# Patient Record
Sex: Female | Born: 1940 | Race: Black or African American | Hispanic: No | Marital: Married | Smoking: Never smoker
Health system: Southern US, Community
[De-identification: ages and names within clinical notes are randomized; demographics above are authoritative.]

## PROBLEM LIST (undated history)

## (undated) DIAGNOSIS — M4712 Other spondylosis with myelopathy, cervical region: Secondary | ICD-10-CM

## (undated) DIAGNOSIS — F32A Depression, unspecified: Secondary | ICD-10-CM

## (undated) DIAGNOSIS — R569 Unspecified convulsions: Secondary | ICD-10-CM

## (undated) DIAGNOSIS — G4730 Sleep apnea, unspecified: Secondary | ICD-10-CM

## (undated) DIAGNOSIS — G959 Disease of spinal cord, unspecified: Secondary | ICD-10-CM

## (undated) DIAGNOSIS — F329 Major depressive disorder, single episode, unspecified: Secondary | ICD-10-CM

## (undated) DIAGNOSIS — I10 Essential (primary) hypertension: Secondary | ICD-10-CM

## (undated) DIAGNOSIS — M5412 Radiculopathy, cervical region: Secondary | ICD-10-CM

## (undated) DIAGNOSIS — R55 Syncope and collapse: Secondary | ICD-10-CM

## (undated) HISTORY — DX: Radiculopathy, cervical region: G95.9

## (undated) HISTORY — DX: Depression, unspecified: F32.A

## (undated) HISTORY — PX: EYE SURGERY: SHX253

## (undated) HISTORY — PX: SPINE SURGERY: SHX786

## (undated) HISTORY — DX: Other spondylosis with myelopathy, cervical region: M47.12

## (undated) HISTORY — PX: CATARACT EXTRACTION: SUR2

## (undated) HISTORY — DX: Major depressive disorder, single episode, unspecified: F32.9

## (undated) HISTORY — PX: VAGINAL HYSTERECTOMY: SUR661

## (undated) HISTORY — DX: Sleep apnea, unspecified: G47.30

## (undated) HISTORY — DX: Disease of spinal cord, unspecified: M54.12

## (undated) HISTORY — PX: FOOT SURGERY: SHX648

## (undated) HISTORY — PX: NECK SURGERY: SHX720

## (undated) HISTORY — PX: COLONOSCOPY: SHX5424

---

## 2005-09-12 ENCOUNTER — Ambulatory Visit: Payer: Self-pay

## 2005-10-01 ENCOUNTER — Ambulatory Visit: Payer: Self-pay | Admitting: Family

## 2005-11-08 ENCOUNTER — Ambulatory Visit: Payer: Self-pay | Admitting: Oncology

## 2005-12-11 LAB — CBC WITH DIFFERENTIAL (CANCER CENTER ONLY)
BASO#: 0.1 10*3/uL (ref 0.0–0.2)
BASO%: 0.8 % (ref 0.0–2.0)
EOS%: 3 % (ref 0.0–7.0)
Eosinophils Absolute: 0.3 10*3/uL (ref 0.0–0.5)
HCT: 39.6 % (ref 34.8–46.6)
HGB: 12.9 g/dL (ref 11.6–15.9)
LYMPH#: 3.4 10*3/uL — ABNORMAL HIGH (ref 0.9–3.3)
LYMPH%: 36.9 % (ref 14.0–48.0)
MCH: 26.3 pg (ref 26.0–34.0)
MCHC: 32.7 g/dL (ref 32.0–36.0)
MCV: 81 fL (ref 81–101)
MONO#: 0.8 10*3/uL (ref 0.1–0.9)
MONO%: 8.2 % (ref 0.0–13.0)
NEUT#: 4.7 10*3/uL (ref 1.5–6.5)
NEUT%: 51.1 % (ref 39.6–80.0)
Platelets: 558 10*3/uL — ABNORMAL HIGH (ref 145–400)
RBC: 4.91 10*6/uL (ref 3.70–5.32)
RDW: 14.3 % (ref 10.5–14.6)
WBC: 9.2 10*3/uL (ref 3.9–10.0)

## 2006-06-10 ENCOUNTER — Ambulatory Visit: Payer: Self-pay | Admitting: Oncology

## 2006-06-26 LAB — CBC WITH DIFFERENTIAL (CANCER CENTER ONLY)
BASO#: 0 10*3/uL (ref 0.0–0.2)
BASO%: 0.5 % (ref 0.0–2.0)
EOS%: 3.2 % (ref 0.0–7.0)
Eosinophils Absolute: 0.2 10*3/uL (ref 0.0–0.5)
HCT: 36.8 % (ref 34.8–46.6)
HGB: 12.2 g/dL (ref 11.6–15.9)
LYMPH#: 2.9 10*3/uL (ref 0.9–3.3)
LYMPH%: 43 % (ref 14.0–48.0)
MCH: 26.9 pg (ref 26.0–34.0)
MCHC: 33 g/dL (ref 32.0–36.0)
MCV: 81 fL (ref 81–101)
MONO#: 0.5 10*3/uL (ref 0.1–0.9)
MONO%: 7.5 % (ref 0.0–13.0)
NEUT#: 3.1 10*3/uL (ref 1.5–6.5)
NEUT%: 45.8 % (ref 39.6–80.0)
Platelets: 365 10*3/uL (ref 145–400)
RBC: 4.53 10*6/uL (ref 3.70–5.32)
RDW: 13.5 % (ref 10.5–14.6)
WBC: 6.8 10*3/uL (ref 3.9–10.0)

## 2006-10-28 ENCOUNTER — Ambulatory Visit: Payer: Self-pay | Admitting: Internal Medicine

## 2007-11-04 ENCOUNTER — Ambulatory Visit: Payer: Self-pay | Admitting: Internal Medicine

## 2008-07-16 ENCOUNTER — Emergency Department: Payer: Self-pay | Admitting: Emergency Medicine

## 2008-08-25 ENCOUNTER — Ambulatory Visit: Payer: Self-pay | Admitting: Gastroenterology

## 2008-10-29 ENCOUNTER — Emergency Department: Payer: Self-pay | Admitting: Emergency Medicine

## 2008-11-04 ENCOUNTER — Ambulatory Visit: Payer: Self-pay | Admitting: Internal Medicine

## 2008-11-08 ENCOUNTER — Emergency Department: Payer: Self-pay | Admitting: Emergency Medicine

## 2008-12-29 ENCOUNTER — Emergency Department: Payer: Self-pay | Admitting: Emergency Medicine

## 2009-02-14 ENCOUNTER — Ambulatory Visit: Payer: Self-pay | Admitting: Internal Medicine

## 2009-09-20 ENCOUNTER — Emergency Department: Payer: Self-pay | Admitting: Emergency Medicine

## 2009-12-22 ENCOUNTER — Ambulatory Visit: Payer: Self-pay | Admitting: Internal Medicine

## 2010-08-23 ENCOUNTER — Encounter: Payer: Self-pay | Admitting: Surgery

## 2010-09-06 ENCOUNTER — Encounter: Payer: Self-pay | Admitting: Surgery

## 2010-10-23 DIAGNOSIS — M2550 Pain in unspecified joint: Secondary | ICD-10-CM | POA: Insufficient documentation

## 2010-10-29 ENCOUNTER — Observation Stay: Payer: Self-pay | Admitting: Internal Medicine

## 2010-12-26 ENCOUNTER — Ambulatory Visit: Payer: Self-pay | Admitting: Internal Medicine

## 2011-01-09 DIAGNOSIS — M545 Low back pain, unspecified: Secondary | ICD-10-CM | POA: Insufficient documentation

## 2011-01-13 ENCOUNTER — Ambulatory Visit: Payer: Self-pay | Admitting: Internal Medicine

## 2011-01-17 DIAGNOSIS — M5030 Other cervical disc degeneration, unspecified cervical region: Secondary | ICD-10-CM | POA: Insufficient documentation

## 2011-03-20 ENCOUNTER — Ambulatory Visit: Payer: Self-pay | Admitting: Physician Assistant

## 2011-05-08 DIAGNOSIS — R0602 Shortness of breath: Secondary | ICD-10-CM | POA: Insufficient documentation

## 2011-07-10 ENCOUNTER — Ambulatory Visit: Payer: Self-pay | Admitting: Rheumatology

## 2011-12-03 DIAGNOSIS — M4712 Other spondylosis with myelopathy, cervical region: Secondary | ICD-10-CM | POA: Insufficient documentation

## 2011-12-03 DIAGNOSIS — IMO0002 Reserved for concepts with insufficient information to code with codable children: Secondary | ICD-10-CM | POA: Insufficient documentation

## 2011-12-23 ENCOUNTER — Emergency Department: Payer: Self-pay | Admitting: Unknown Physician Specialty

## 2011-12-23 LAB — COMPREHENSIVE METABOLIC PANEL
Albumin: 3.7 g/dL (ref 3.4–5.0)
Alkaline Phosphatase: 65 U/L (ref 50–136)
Anion Gap: 5 — ABNORMAL LOW (ref 7–16)
BUN: 19 mg/dL — ABNORMAL HIGH (ref 7–18)
Bilirubin,Total: 0.4 mg/dL (ref 0.2–1.0)
Calcium, Total: 8.9 mg/dL (ref 8.5–10.1)
Chloride: 109 mmol/L — ABNORMAL HIGH (ref 98–107)
Co2: 29 mmol/L (ref 21–32)
Creatinine: 1.21 mg/dL (ref 0.60–1.30)
EGFR (African American): 52 — ABNORMAL LOW
EGFR (Non-African Amer.): 45 — ABNORMAL LOW
Glucose: 99 mg/dL (ref 65–99)
Osmolality: 287 (ref 275–301)
Potassium: 3.7 mmol/L (ref 3.5–5.1)
SGOT(AST): 20 U/L (ref 15–37)
SGPT (ALT): 21 U/L (ref 12–78)
Sodium: 143 mmol/L (ref 136–145)
Total Protein: 7 g/dL (ref 6.4–8.2)

## 2011-12-23 LAB — URINALYSIS, COMPLETE
Bilirubin,UR: NEGATIVE
Blood: NEGATIVE
Glucose,UR: NEGATIVE mg/dL (ref 0–75)
Ketone: NEGATIVE
Nitrite: NEGATIVE
Ph: 6 (ref 4.5–8.0)
Protein: NEGATIVE
RBC,UR: 4 /HPF (ref 0–5)
Specific Gravity: 1.014 (ref 1.003–1.030)
Squamous Epithelial: 3
WBC UR: 12 /HPF (ref 0–5)

## 2011-12-23 LAB — CBC
HCT: 39.2 % (ref 35.0–47.0)
HGB: 12.7 g/dL (ref 12.0–16.0)
MCH: 26.9 pg (ref 26.0–34.0)
MCHC: 32.4 g/dL (ref 32.0–36.0)
MCV: 83 fL (ref 80–100)
Platelet: 280 10*3/uL (ref 150–440)
RBC: 4.71 10*6/uL (ref 3.80–5.20)
RDW: 15.2 % — ABNORMAL HIGH (ref 11.5–14.5)
WBC: 7 10*3/uL (ref 3.6–11.0)

## 2012-01-14 ENCOUNTER — Ambulatory Visit: Payer: Self-pay | Admitting: Internal Medicine

## 2012-03-01 ENCOUNTER — Emergency Department: Payer: Self-pay | Admitting: Emergency Medicine

## 2012-03-01 LAB — COMPREHENSIVE METABOLIC PANEL
Albumin: 3.6 g/dL (ref 3.4–5.0)
Alkaline Phosphatase: 81 U/L (ref 50–136)
Anion Gap: 7 (ref 7–16)
BUN: 18 mg/dL (ref 7–18)
Bilirubin,Total: 0.5 mg/dL (ref 0.2–1.0)
Calcium, Total: 9 mg/dL (ref 8.5–10.1)
Chloride: 110 mmol/L — ABNORMAL HIGH (ref 98–107)
Co2: 26 mmol/L (ref 21–32)
Creatinine: 1.27 mg/dL (ref 0.60–1.30)
EGFR (African American): 49 — ABNORMAL LOW
EGFR (Non-African Amer.): 42 — ABNORMAL LOW
Glucose: 95 mg/dL (ref 65–99)
Osmolality: 287 (ref 275–301)
Potassium: 3.8 mmol/L (ref 3.5–5.1)
SGOT(AST): 20 U/L (ref 15–37)
SGPT (ALT): 22 U/L (ref 12–78)
Sodium: 143 mmol/L (ref 136–145)
Total Protein: 7.3 g/dL (ref 6.4–8.2)

## 2012-03-01 LAB — URINALYSIS, COMPLETE
Bilirubin,UR: NEGATIVE
Blood: NEGATIVE
Glucose,UR: NEGATIVE mg/dL (ref 0–75)
Ketone: NEGATIVE
Nitrite: NEGATIVE
Ph: 6 (ref 4.5–8.0)
Protein: NEGATIVE
RBC,UR: 2 /HPF (ref 0–5)
Specific Gravity: 1.018 (ref 1.003–1.030)
Squamous Epithelial: 3
WBC UR: 11 /HPF (ref 0–5)

## 2012-03-01 LAB — CBC
HCT: 39.4 % (ref 35.0–47.0)
HGB: 12.8 g/dL (ref 12.0–16.0)
MCH: 27 pg (ref 26.0–34.0)
MCHC: 32.4 g/dL (ref 32.0–36.0)
MCV: 83 fL (ref 80–100)
Platelet: 281 10*3/uL (ref 150–440)
RBC: 4.72 10*6/uL (ref 3.80–5.20)
RDW: 15.1 % — ABNORMAL HIGH (ref 11.5–14.5)
WBC: 7.6 10*3/uL (ref 3.6–11.0)

## 2012-09-17 ENCOUNTER — Emergency Department: Payer: Self-pay | Admitting: Emergency Medicine

## 2012-09-17 LAB — CBC
HCT: 39.4 % (ref 35.0–47.0)
HGB: 13 g/dL (ref 12.0–16.0)
MCH: 27.1 pg (ref 26.0–34.0)
MCHC: 33 g/dL (ref 32.0–36.0)
MCV: 82 fL (ref 80–100)
Platelet: 267 10*3/uL (ref 150–440)
RBC: 4.81 10*6/uL (ref 3.80–5.20)
RDW: 15.9 % — ABNORMAL HIGH (ref 11.5–14.5)
WBC: 7.8 10*3/uL (ref 3.6–11.0)

## 2012-09-17 LAB — COMPREHENSIVE METABOLIC PANEL
Albumin: 3.7 g/dL (ref 3.4–5.0)
Alkaline Phosphatase: 82 U/L (ref 50–136)
Anion Gap: 6 — ABNORMAL LOW (ref 7–16)
BUN: 25 mg/dL — ABNORMAL HIGH (ref 7–18)
Bilirubin,Total: 0.3 mg/dL (ref 0.2–1.0)
Calcium, Total: 9.3 mg/dL (ref 8.5–10.1)
Chloride: 109 mmol/L — ABNORMAL HIGH (ref 98–107)
Co2: 27 mmol/L (ref 21–32)
Creatinine: 1.3 mg/dL (ref 0.60–1.30)
EGFR (African American): 48 — ABNORMAL LOW
EGFR (Non-African Amer.): 41 — ABNORMAL LOW
Glucose: 117 mg/dL — ABNORMAL HIGH (ref 65–99)
Osmolality: 289 (ref 275–301)
Potassium: 3.9 mmol/L (ref 3.5–5.1)
SGOT(AST): 23 U/L (ref 15–37)
SGPT (ALT): 26 U/L (ref 12–78)
Sodium: 142 mmol/L (ref 136–145)
Total Protein: 7.3 g/dL (ref 6.4–8.2)

## 2013-01-14 ENCOUNTER — Ambulatory Visit: Payer: Self-pay | Admitting: Internal Medicine

## 2013-03-16 ENCOUNTER — Emergency Department: Payer: Self-pay | Admitting: Emergency Medicine

## 2013-03-31 ENCOUNTER — Emergency Department: Payer: Self-pay | Admitting: Emergency Medicine

## 2013-04-07 ENCOUNTER — Ambulatory Visit (INDEPENDENT_AMBULATORY_CARE_PROVIDER_SITE_OTHER): Payer: Medicare Other

## 2013-04-07 ENCOUNTER — Ambulatory Visit (INDEPENDENT_AMBULATORY_CARE_PROVIDER_SITE_OTHER): Payer: Medicare Other | Admitting: Podiatry

## 2013-04-07 ENCOUNTER — Encounter: Payer: Self-pay | Admitting: Podiatry

## 2013-04-07 VITALS — BP 74/42 | HR 68 | Resp 16 | Ht 64.0 in | Wt 181.0 lb

## 2013-04-07 DIAGNOSIS — M79609 Pain in unspecified limb: Secondary | ICD-10-CM

## 2013-04-07 DIAGNOSIS — M779 Enthesopathy, unspecified: Secondary | ICD-10-CM

## 2013-04-07 DIAGNOSIS — M79673 Pain in unspecified foot: Secondary | ICD-10-CM

## 2013-04-07 DIAGNOSIS — S93409A Sprain of unspecified ligament of unspecified ankle, initial encounter: Secondary | ICD-10-CM

## 2013-04-07 DIAGNOSIS — L84 Corns and callosities: Secondary | ICD-10-CM

## 2013-04-07 MED ORDER — TRIAMCINOLONE ACETONIDE 10 MG/ML IJ SUSP
10.0000 mg | Freq: Once | INTRAMUSCULAR | Status: AC
  Administered 2013-04-07: 10 mg

## 2013-04-07 NOTE — Progress Notes (Signed)
   Subjective:    Patient ID: Anna Chambers, female    DOB: 1940/06/23, 73 y.o.   MRN: [id number]  HPI Comments: N pain L left ankle, great toe, and callus D off and on 1 yr O slowly C worse A ? T went to ER last wed 2.2415 told her to cont with her meds and gave pt shot in left hip  Foot Pain Associated symptoms include fatigue, numbness and weakness.      Review of Systems  Constitutional: Positive for activity change and fatigue.  HENT: Negative.   Respiratory: Negative.   Cardiovascular: Positive for leg swelling.       Calf pain when walking  Gastrointestinal: Negative.   Endocrine: Negative.   Genitourinary: Negative.   Musculoskeletal:       Joint pain Back pain Difficulty walking Muscle pain  Skin: Negative.   Allergic/Immunologic: Negative.   Neurological: Positive for dizziness, weakness, light-headedness and numbness.  Hematological: Negative.   Psychiatric/Behavioral: Negative.        Objective:   Physical Exam        Assessment & Plan:

## 2013-04-07 NOTE — Progress Notes (Signed)
Subjective:     Patient ID: Anna Chambers, female   DOB: [birth year], 73 y.o.   MRN: [id number]  Foot Pain   patient presents stating I have been having problems with my left foot for around a year that started in my ankle and now has progressed into my lower leg and toe as I have walk differently states that she has tried soaks and supportive shoes without relief of symptoms and feels like her ankle is weak   Review of Systems  All other systems reviewed and are negative.       Objective:   Physical Exam  Nursing note and vitals reviewed. Constitutional: She is oriented to person, place, and time.  Cardiovascular: Intact distal pulses.   Musculoskeletal: Normal range of motion.  Neurological: She is oriented to person, place, and time.  Skin: Skin is warm.   neurovascular status is intact with normal range of motion of the subtalar and midtarsal joint with some excessive inversion noted of the left ankle. 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 Keratotic lesion sub-heel left    Plan:     H&P and multiple view x-rays taken left. Injected the sinus tarsi 3 mg Kenalog 5 mg Xylocaine Marcaine mixture and apply ankle brace in order to prevent inversion eversion along with debridement of callus plantar left reappoint her recheck

## 2013-04-21 ENCOUNTER — Ambulatory Visit (INDEPENDENT_AMBULATORY_CARE_PROVIDER_SITE_OTHER): Payer: Medicare Other | Admitting: Podiatry

## 2013-04-21 VITALS — BP 107/40 | HR 74 | Resp 16 | Ht 64.0 in | Wt 183.0 lb

## 2013-04-21 DIAGNOSIS — M7750 Other enthesopathy of unspecified foot: Secondary | ICD-10-CM

## 2013-04-21 DIAGNOSIS — R609 Edema, unspecified: Secondary | ICD-10-CM

## 2013-04-21 MED ORDER — METHYLPREDNISOLONE 4 MG PO KIT: PACK | ORAL | Status: DC

## 2013-04-21 MED ORDER — TRIAMCINOLONE ACETONIDE 10 MG/ML IJ SUSP
10.0000 mg | Freq: Once | INTRAMUSCULAR | Status: AC
  Administered 2013-04-21: 10 mg

## 2013-04-21 NOTE — Progress Notes (Signed)
Subjective:     Patient ID: Anna Chambers, female   DOB: 1940/04/08, 73 y.o.   MRN: [id number]  HPI patient states she is still having pain in her left ankle but is also getting pain in her knee and her hip recently  Review of Systems     Objective:   Physical Exam Neurovascular status unchanged with patient well oriented x3 and pain and a slightly more plantar all and proximal direction from it was the previous visit. She complains of what may be some sciatica or inflammation pain in her left buttock cheek and knee    Assessment:    reviewed condition and explained the patient tendinitis and inflammation    Plan:     Placed on 6 day Medrol Dosepak to reduce inflammation and did a careful more plantar proximal injection 3 mg Kenalog 5 mg Xylocaine Marcaine mixture  in and around the peroneal tendon complex

## 2013-05-13 ENCOUNTER — Ambulatory Visit: Payer: Self-pay | Admitting: Ophthalmology

## 2013-05-24 ENCOUNTER — Emergency Department: Payer: Self-pay | Admitting: Emergency Medicine

## 2013-06-02 ENCOUNTER — Ambulatory Visit: Payer: 59 | Admitting: Podiatry

## 2013-06-24 ENCOUNTER — Ambulatory Visit: Payer: Self-pay | Admitting: Specialist

## 2013-10-05 ENCOUNTER — Emergency Department: Payer: Self-pay | Admitting: Emergency Medicine

## 2013-12-03 ENCOUNTER — Encounter: Payer: Self-pay | Admitting: Neurology

## 2013-12-06 ENCOUNTER — Encounter: Payer: Self-pay | Admitting: Neurology

## 2014-01-05 ENCOUNTER — Encounter: Payer: Self-pay | Admitting: Neurology

## 2014-03-10 ENCOUNTER — Ambulatory Visit: Payer: Self-pay | Admitting: Internal Medicine

## 2014-07-11 ENCOUNTER — Emergency Department: Payer: Medicare Other

## 2014-07-11 ENCOUNTER — Emergency Department: Admit: 2014-07-11 | Payer: Self-pay

## 2014-07-11 ENCOUNTER — Other Ambulatory Visit: Payer: Self-pay

## 2014-07-11 ENCOUNTER — Emergency Department
Admission: EM | Admit: 2014-07-11 | Discharge: 2014-07-11 | Disposition: A | Discharge status: Home / Self Care | Payer: Medicare Other | Attending: Emergency Medicine | Admitting: Emergency Medicine

## 2014-07-11 ENCOUNTER — Encounter: Payer: Self-pay | Admitting: *Deleted

## 2014-07-11 DIAGNOSIS — R61 Generalized hyperhidrosis: Secondary | ICD-10-CM | POA: Insufficient documentation

## 2014-07-11 DIAGNOSIS — Z7982 Long term (current) use of aspirin: Secondary | ICD-10-CM | POA: Diagnosis not present

## 2014-07-11 DIAGNOSIS — I10 Essential (primary) hypertension: Secondary | ICD-10-CM | POA: Insufficient documentation

## 2014-07-11 DIAGNOSIS — R55 Syncope and collapse: Secondary | ICD-10-CM | POA: Insufficient documentation

## 2014-07-11 DIAGNOSIS — Z79899 Other long term (current) drug therapy: Secondary | ICD-10-CM | POA: Insufficient documentation

## 2014-07-11 HISTORY — DX: Essential (primary) hypertension: I10

## 2014-07-11 LAB — CBC WITH DIFFERENTIAL/PLATELET
Basophils Absolute: 0.1 10*3/uL (ref 0–0.1)
Basophils Relative: 1 %
Eosinophils Absolute: 0.2 10*3/uL (ref 0–0.7)
Eosinophils Relative: 2 %
HCT: 39.9 % (ref 35.0–47.0)
Hemoglobin: 12.6 g/dL (ref 12.0–16.0)
Lymphocytes Relative: 22 %
Lymphs Abs: 2.1 10*3/uL (ref 1.0–3.6)
MCH: 26.2 pg (ref 26.0–34.0)
MCHC: 31.6 g/dL — ABNORMAL LOW (ref 32.0–36.0)
MCV: 82.9 fL (ref 80.0–100.0)
Monocytes Absolute: 0.8 10*3/uL (ref 0.2–0.9)
Monocytes Relative: 8 %
Neutro Abs: 6.2 10*3/uL (ref 1.4–6.5)
Neutrophils Relative %: 67 %
Platelets: 283 10*3/uL (ref 150–440)
RBC: 4.81 MIL/uL (ref 3.80–5.20)
RDW: 15.8 % — ABNORMAL HIGH (ref 11.5–14.5)
WBC: 9.2 10*3/uL (ref 3.6–11.0)

## 2014-07-11 LAB — BASIC METABOLIC PANEL
Anion gap: 6 (ref 5–15)
BUN: 20 mg/dL (ref 6–20)
CO2: 27 mmol/L (ref 22–32)
Calcium: 8.9 mg/dL (ref 8.9–10.3)
Chloride: 109 mmol/L (ref 101–111)
Creatinine, Ser: 1.14 mg/dL — ABNORMAL HIGH (ref 0.44–1.00)
GFR calc Af Amer: 54 mL/min — ABNORMAL LOW (ref >60)
GFR calc non Af Amer: 47 mL/min — ABNORMAL LOW (ref >60)
Glucose, Bld: 117 mg/dL — ABNORMAL HIGH (ref 65–99)
Potassium: 3.8 mmol/L (ref 3.5–5.1)
Sodium: 142 mmol/L (ref 135–145)

## 2014-07-11 LAB — TROPONIN I
Troponin I: <0.03 ng/mL (ref <0.031)
Troponin I: <0.03 ng/mL (ref <0.031)

## 2014-07-11 NOTE — ED Notes (Signed)
Pt arrives via EMS with complaints of a near syncopal epsiode, pt was at friends house after church and felt hot, dizzy and blurry vision, pt states she did not eat breakfast and the house did not have air conditioning, pt awake and alert during assessment in no distress

## 2014-07-11 NOTE — ED Provider Notes (Signed)
Anna Chambers  ____________________________________________  Time seen: Approximately 3:15 PM  I have reviewed the triage vital signs and the nursing notes.   HISTORY  Chief Complaint Near Syncope    HPI Anna Chambers is a 74 y.o. female with a history of hypertension who presents after near syncopal episode today. The patient says that she was sitting and eating when she started feeling lightheaded, became diaphoretic and said she felt like she was going to pass out. She also had blurred vision and nausea. The symptoms lasted about half an hour and completely resolved. The patient was sitting at the time of the episode. She denies any chest pain or shortness of breath. Said that she did not eat yesterday from 2:00 PM onward and then ate only a small breakfast this morning. She was sitting down to eat and had some tea with zucchini right before she had this episode today.   Past Medical History  Diagnosis Date  . Hypertension     There are no active problems to display for this patient.   Past Surgical History  Procedure Laterality Date  . Vaginal hysterectomy    . Foot surgery Left     Current Outpatient Rx  Name  Route  Sig  Dispense  Refill  . aspirin 81 MG tablet   Oral   Take 81 mg by mouth daily.         . bisoprolol-hydrochlorothiazide (ZIAC) 2.5-6.25 MG per tablet   Oral   Take 1 tablet by mouth daily.         . Brimonidine Tartrate-Timolol (COMBIGAN OP)   Ophthalmic   Apply to eye 2 (two) times daily.         . brinzolamide (AZOPT) 1 % ophthalmic suspension      1 drop 2 (two) times daily.         . DULoxetine (CYMBALTA) 30 MG capsule   Oral   Take 30 mg by mouth daily.         Marland Kitchen gabapentin (NEURONTIN) 300 MG capsule   Oral   Take 300 mg by mouth 2 (two) times daily.         . meloxicam (MOBIC) 15 MG tablet   Oral   Take 15 mg by mouth daily.         . methylPREDNISolone  (MEDROL DOSEPAK) 4 MG tablet      follow package directions   21 tablet   0   . pilocarpine (PILOCAR) 2 % ophthalmic solution      1 drop 4 (four) times daily.         . traMADol (ULTRAM) 50 MG tablet   Oral   Take by mouth every 6 (six) hours as needed.           Allergies Review of patient's allergies indicates no known allergies.  History reviewed. No pertinent family history.  Social History History  Substance Use Topics  . Smoking status: Never Smoker   . Smokeless tobacco: Not on file  . Alcohol Use: No    Review of Systems Constitutional: No fever/chills Eyes: No visual changes. ENT: No sore throat. Cardiovascular: Denies chest pain. Respiratory: Denies shortness of breath. Gastrointestinal: As above  Genitourinary: Negative for dysuria. Musculoskeletal: Negative for back pain. Skin: Negative for rash. Neurological: Negative for headaches, focal weakness or numbness.  10-point ROS otherwise negative.  ____________________________________________   PHYSICAL EXAM:  VITAL SIGNS: ED Triage Vitals  Enc Vitals Group  BP 07/11/14 1423 145/69 mmHg     Pulse Rate 07/11/14 1423 74     Resp 07/11/14 1423 20     Temp 07/11/14 1423 98 F (36.7 C)     Temp Source 07/11/14 1423 Oral     SpO2 07/11/14 1423 98 %     Weight 07/11/14 1423 181 lb (82.101 kg)     Height 07/11/14 1423 5\' 4"  (1.626 m)     Head Cir --      Peak Flow --      Pain Score --      Pain Loc --      Pain Edu? --      Excl. in [place]? --     Constitutional: Alert and oriented. Well appearing and in no acute distress. Eyes: Conjunctivae are normal. PERRL. EOMI. Head: Atraumatic. Nose: No congestion/rhinnorhea. Mouth/Throat: Mucous membranes are moist.  Oropharynx non-erythematous. Neck: No stridor.   Cardiovascular: Normal rate, regular rhythm. Grossly normal heart sounds.  Good peripheral circulation. Respiratory: Normal respiratory effort.  No retractions. Lungs  CTAB. Gastrointestinal: Soft and nontender. No distention. No abdominal bruits. No CVA tenderness. Musculoskeletal: No lower extremity tenderness nor edema.  No joint effusions. Neurologic:  Normal speech and language. No gross focal neurologic deficits are appreciated. Speech is normal. No gait instability. Skin:  Skin is warm, dry and intact. No rash noted. Psychiatric: Mood and affect are normal. Speech and behavior are normal.  ____________________________________________   LABS (all labs ordered are listed, but only abnormal results are displayed)  Labs Reviewed  CBC WITH DIFFERENTIAL/PLATELET - Abnormal; Notable for the following:    MCHC 31.6 (*)    RDW 15.8 (*)    All other components within normal limits  BASIC METABOLIC PANEL - Abnormal; Notable for the following:    Glucose, Bld 117 (*)    Creatinine, Ser 1.14 (*)    GFR calc non Af Amer 47 (*)    GFR calc Af Amer 54 (*)    All other components within normal limits  TROPONIN I  TROPONIN I   ____________________________________________  EKG  ED ECG REPORT I, Doran Stabler, the attending physician, personally viewed and interpreted this ECG.   Date: 07/11/2014  EKG Time: 1458  Rate: 71  Rhythm: normal sinus rhythm  Axis: Normal axis  Intervals:none  ST&T Change: No ST elevations or depressions. No abnormal T-wave inversions.  ____________________________________________  RADIOLOGY  suboptimal inspiration, no consolidation ____________________________________________   PROCEDURES   ____________________________________________   INITIAL IMPRESSION / ASSESSMENT AND PLAN / ED COURSE  Pertinent labs & imaging results that were available during my care of the patient were reviewed by me and considered in my medical decision making (see chart for details).  ----------------------------------------- 6:25 PM on 07/11/2014 -----------------------------------------  Patient continues to feel  baseline. Says, "I feel good."  Discharged home. Will follow up with primary care doctor or Dr. Clayborn Bigness within the next 2-3 days. Has seen Dr. Clayborn Bigness in the past. Likely visit secondary to vasovagal episode secondary to decreased by mouth intake over the last 2 days. Will continue to eat when she gets home. Nose importance of not skipping meals. ____________________________________________   FINAL CLINICAL IMPRESSION(S) / ED DIAGNOSES  Acute near syncope. Initial visit.    Orbie Pyo, MD 07/11/14 [phone]

## 2014-07-11 NOTE — ED Notes (Signed)
MD states Troponin to be drawn at this time. MD communicated this to lab via phone.

## 2014-07-11 NOTE — Discharge Instructions (Signed)
Near-Syncope Near-syncope (commonly known as near fainting) is sudden weakness, dizziness, or feeling like you might pass out. During an episode of near-syncope, you may also develop pale skin, have tunnel vision, or feel sick to your stomach (nauseous). Near-syncope may occur when getting up after sitting or while standing for a long time. It is caused by a sudden decrease in blood flow to the brain. This decrease can result from various causes or triggers, most of which are not serious. However, because near-syncope can sometimes be a sign of something serious, a medical evaluation is required. The specific cause is often not determined. HOME CARE INSTRUCTIONS  Monitor your condition for any changes. The following actions may help to alleviate any discomfort you are experiencing:  Have someone stay with you until you feel stable.  Lie down right away and prop your feet up if you start feeling like you might faint. Breathe deeply and steadily. Wait until all the symptoms have passed. Most of these episodes last only a few minutes. You may feel tired for several hours.   Drink enough fluids to keep your urine clear or pale yellow.   If you are taking blood pressure or heart medicine, get up slowly when seated or lying down. Take several minutes to sit and then stand. This can reduce dizziness.  Follow up with your health care provider as directed. SEEK IMMEDIATE MEDICAL CARE IF:   You have a severe headache.   You have unusual pain in the chest, abdomen, or back.   You are bleeding from the mouth or rectum, or you have black or tarry stool.   You have an irregular or very fast heartbeat.   You have repeated fainting or have seizure-like jerking during an episode.   You faint when sitting or lying down.   You have confusion.   You have difficulty walking.   You have severe weakness.   You have vision problems.  MAKE SURE YOU:   Understand these instructions.  Will  watch your condition.  Will get help right away if you are not doing well or get worse. Document Released: 01/22/2005 Document Revised: 01/27/2013 Document Reviewed: 06/27/2012 Exit[employer] Patient Information 2015 [employer]. This information is not intended to replace advice given to you by your health care provider. Make sure you discuss any questions you have with your health care provider.  

## 2014-07-12 ENCOUNTER — Ambulatory Visit: Payer: Medicare Other | Admitting: Podiatry

## 2014-07-15 ENCOUNTER — Ambulatory Visit (INDEPENDENT_AMBULATORY_CARE_PROVIDER_SITE_OTHER): Payer: Medicare Other | Admitting: Podiatry

## 2014-07-15 ENCOUNTER — Ambulatory Visit (INDEPENDENT_AMBULATORY_CARE_PROVIDER_SITE_OTHER): Payer: Medicare Other

## 2014-07-15 ENCOUNTER — Encounter: Payer: Self-pay | Admitting: Podiatry

## 2014-07-15 VITALS — BP 149/90 | HR 68 | Resp 17 | Ht 64.0 in | Wt 180.0 lb

## 2014-07-15 DIAGNOSIS — M25572 Pain in left ankle and joints of left foot: Secondary | ICD-10-CM

## 2014-07-15 DIAGNOSIS — R262 Difficulty in walking, not elsewhere classified: Secondary | ICD-10-CM | POA: Diagnosis not present

## 2014-07-15 DIAGNOSIS — M2142 Flat foot [pes planus] (acquired), left foot: Secondary | ICD-10-CM

## 2014-07-15 DIAGNOSIS — M79672 Pain in left foot: Secondary | ICD-10-CM

## 2014-07-15 DIAGNOSIS — M2141 Flat foot [pes planus] (acquired), right foot: Secondary | ICD-10-CM | POA: Diagnosis not present

## 2014-07-15 DIAGNOSIS — M6281 Muscle weakness (generalized): Secondary | ICD-10-CM | POA: Diagnosis not present

## 2014-07-15 NOTE — Progress Notes (Signed)
Patient ID: Anna Chambers, female   DOB: 24-Dec-1940, 74 y.o.   MRN: [id number]  Subjective: Anna Chambers, 74 year old female, presents to the office today for follow-up evaluation and continued are of left foot pain which as been ongoing since 2014. She states that she gets pain to the left foot with prolonged weightbearing or walking. She previously had an injection in March which seemed to help. She denies any history of injury or trauma. She denies any swelling or redness. She also states that she feels unsteady and feels like she is going to fall. She is asking to see if there is a brace which can help. She also states she has seen another physician at [facility] who talked to her about neuropathy and possible foot drop. She gets some intermittent tingling/numbness to her feet. No other complaints at this time in no acute changes since last appointment.  Objective: AAO x3, NAD DP/PT pulses palpable b/l, CRT < 3 sec Protective sensation appears to be intact with Derrel Nip monofilament, Achilles tendon reflex intact. There is some subjective numbness over her feet. There is mild to palpation along the dorsal aspect of the left midfoot. There is no specific area pinpoint bony tenderness there is no pain with vibratory sensation. Ankle joint range of motion is intact. Subtalar joint range of motion is limited however there is no pain with range of motion or crepitation. There is no other areas of tenderness bilateral lower extremities. There is no overlying edema, erythema, increase in warmth bilaterally. Upon gait evaluation she does appear to be dragging both of her feet somewhat. MMT is slightly decreased on the left in dorsiflexion. She does not appear to have an overwheling foot drop. However when she walks, she does look unsteady. She states because of the left foot she feels as if she is going to fall.  No open lesions or pre-ulcerative lesions No pain with calf compression, swelling, warmth,  erythema.  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  -Treatment options discussed including all alternatives, risks, and complications -Recommended patient to continue for up with her primary care as is her the other physician at [facility] regards to possible foot drop and neuropathy. -Discussed likely etiology of her symptoms to her foot. She does have arthritic changes present on x-ray. -I do believe that as she is unsteady in gait and standing she would likely benefit from a [place]. She was evaluated as well by our orthitist, Anna Chambers, who casted her for this.  -Follow-up once the brace arrives or sooner should any problems arise. The meantime I encouraged her call the office with any questions, concerns, change in symptoms.

## 2014-12-07 DIAGNOSIS — E669 Obesity, unspecified: Secondary | ICD-10-CM | POA: Insufficient documentation

## 2014-12-20 ENCOUNTER — Ambulatory Visit: Payer: Medicare Other | Attending: Neurology | Admitting: Occupational Therapy

## 2014-12-20 ENCOUNTER — Encounter: Payer: Self-pay | Admitting: Occupational Therapy

## 2014-12-20 DIAGNOSIS — R4189 Other symptoms and signs involving cognitive functions and awareness: Secondary | ICD-10-CM | POA: Diagnosis present

## 2014-12-20 DIAGNOSIS — R449 Unspecified symptoms and signs involving general sensations and perceptions: Secondary | ICD-10-CM

## 2014-12-20 DIAGNOSIS — R202 Paresthesia of skin: Secondary | ICD-10-CM | POA: Diagnosis present

## 2014-12-20 DIAGNOSIS — M6289 Other specified disorders of muscle: Secondary | ICD-10-CM | POA: Insufficient documentation

## 2014-12-20 DIAGNOSIS — M5010 Cervical disc disorder with radiculopathy, unspecified cervical region: Secondary | ICD-10-CM | POA: Diagnosis present

## 2014-12-20 NOTE — Therapy (Signed)
[facility] [address], [place], [address] Phone: [phone]   Fax:  [phone]  Occupational Therapy Treatment  Patient Details  Name: APOORVA MANERS MRN: [id number] Date of Birth: [birth year] Referring Provider: Manuella Ghazi  Encounter Date: 12/20/2014      OT End of Session - 12/20/14 0912    Visit Number 1   Number of Visits 1   Date for OT Re-Evaluation 12/20/14   OT Start Time 0830   OT Stop Time 0901   OT Time Calculation (min) 31 min   Activity Tolerance Patient tolerated treatment well   Behavior During Therapy [facility] for tasks assessed/performed      Past Medical History  Diagnosis Date  . Hypertension   . Depression   . Sleep apnea     Past Surgical History  Procedure Laterality Date  . Vaginal hysterectomy    . Foot surgery Left   . Spine surgery    . Eye surgery      There were no vitals filed for this visit.  Visit Diagnosis:  Sensory deficit, bilateral - Plan: Ot plan of care cert/re-cert      Subjective Assessment - 12/20/14 0906    Subjective  Had cervical surgery 2014 - with numbness in 3 digits but since this summer my pinkie and ring finger also now pins and needles    Patient Stated Goals I want to try and avoid surgery   Currently in Pain? Yes   Pain Score 5    Pain Location Shoulder   Pain Orientation Left            [facility] OT Assessment - 12/20/14 0001    Assessment   Diagnosis Cervical myelopathy with numbness in bilateral hands   Referring Provider Manuella Ghazi   Onset Date 07/07/14   Home  Environment   Lives With Spouse   Prior Function   Leisure Retired - likes to do puzzles, house work, cooking, shopping and R hand dominant -  report having hard time putting on earrings, small buttons - hands feels like sand paper    Strength   Right Hand Grip (lbs) 40   Right Hand Lateral Pinch 15 lbs   Right Hand 3 Point Pinch 20 lbs   Left Hand Grip (lbs) 36   Left Hand  Lateral Pinch 14 lbs   Left Hand 3 Point Pinch 14 lbs                                      Plan - 12/20/14 0912    Clinical Impression Statement Pt present with bilateral hand numbness L worse and R - and medial 3 digits numnb since cervical Fusion in 2014 - could not tell me what level - pt now since this past summer with numbness in ulnar 2 digits - pt negative for Cubital tunnel and CTS test -  test on Semmes Weinstein  normal sensation - report more feel of pins and needles , and sand paper feel -  only hard thing to do is jewlery ( earrings) and  small buttons - she denies dropping objects or  any falls in the last 6 months - pt seen  PT  at [facility] in past for footdrop - talk with her and  setup eval with Beth on 21st Nov for cervical myelopathy     OT Frequency One time  visit   Plan Set up PT eval 21 Nov for cervical myelopathy   Consulted and Agree with Plan of Care Patient          G-Codes - 01-15-2015 [id number]    Functional Assessment Tool Used Grip and prehension ,Quickdash , semmes Weinstein , test for CTS and Cubital tunnel, clincal judgement   Functional Limitation Self care   Self Care Current Status [phone]) At least 1 percent but less than 20 percent impaired, limited or restricted   Self Care Goal Status [id number]) At least 1 percent but less than 20 percent impaired, limited or restricted   Self Care Discharge Status [phone]) At least 1 percent but less than 20 percent impaired, limited or restricted      Problem List There are no active problems to display for this patient.   Rosalyn Gess OTR/L,CLT January 15, 2015, 9:21 AM  [facility] [address] [place], [address] Phone: [phone]   Fax:  [phone]  Name: EVERLINE FLOW MRN: [id number] Date of Birth: April 28, 1940

## 2014-12-20 NOTE — Patient Instructions (Signed)
Refer to PT for cervical evaluation - Anna Chambers seen her for her foot drop in past

## 2014-12-27 ENCOUNTER — Ambulatory Visit: Payer: Medicare Other | Admitting: Physical Therapy

## 2014-12-27 ENCOUNTER — Encounter: Payer: Self-pay | Admitting: Physical Therapy

## 2014-12-27 DIAGNOSIS — R29898 Other symptoms and signs involving the musculoskeletal system: Secondary | ICD-10-CM

## 2014-12-27 DIAGNOSIS — M5010 Cervical disc disorder with radiculopathy, unspecified cervical region: Secondary | ICD-10-CM

## 2014-12-27 DIAGNOSIS — R4189 Other symptoms and signs involving cognitive functions and awareness: Secondary | ICD-10-CM | POA: Diagnosis not present

## 2014-12-27 DIAGNOSIS — R202 Paresthesia of skin: Secondary | ICD-10-CM

## 2014-12-27 DIAGNOSIS — R200 Anesthesia of skin: Secondary | ICD-10-CM

## 2014-12-27 NOTE — Therapy (Signed)
[place] MAIN [facility] SERVICES [address] [place], [place], [address] Phone: [phone]   Fax:  [phone]  Physical Therapy Evaluation  Patient Details  Name: Anna Chambers MRN: [id number] Date of Birth: [birth year] Referring Provider: Hemang K. Manuella Ghazi MD  Encounter Date: 12/27/2014      PT End of Session - 12/27/14 1250    Visit Number 1   Number of Visits 9   Date for PT Re-Evaluation 01/24/15   Authorization Type gcode 1   Authorization Time Period 10   PT Start Time 1104   PT Stop Time 1146   PT Time Calculation (min) 42 min   Equipment Utilized During Treatment Gait belt   Activity Tolerance Patient tolerated treatment well   Behavior During Therapy WFL for tasks assessed/performed      Past Medical History  Diagnosis Date  . Hypertension   . Depression   . Sleep apnea     Past Surgical History  Procedure Laterality Date  . Vaginal hysterectomy    . Foot surgery Left   . Spine surgery    . Eye surgery      There were no vitals filed for this visit.  Visit Diagnosis:  Cervical disc disorder with radiculopathy of cervical region - Plan: PT plan of care cert/re-cert  Weakness of left hand - Plan: PT plan of care cert/re-cert  Numbness and tingling in left hand - Plan: PT plan of care cert/re-cert  Numbness and tingling in right hand - Plan: PT plan of care cert/re-cert      Subjective Assessment - 12/27/14 1110    Subjective 74 yo Female reports increased numbness and tingling in bilateral hands/fingertips. Its been going on for a few months and has been progressively getting worse. Patient reports that she can feel hot/cold. She reports feeling more "roughness" on her hands although they are smoothe to touch by others. She reports having increased difficulty picking things up, getting dressed, and other ADLs. She reports feeling increased pain on LUE shoulder blade down arm to finger tips. She hasn't noticed the pain  being connected to the numbness.   Pertinent History Cervical fusion 2014 (no radicular symptoms after surgery); arthritis, HTN (controlled);    How long can you sit comfortably? not limited   How long can you stand comfortably? 2 hours   How long can you walk comfortably? 1-2 hours, uses cane with long distance walking   Diagnostic tests nerve conduction test completed in bilateral hands - results were "ok" according to  MD/patient.   Patient Stated Goals "reduce numbness in hands"   Currently in Pain? No/denies            [facility] PT Assessment - 12/27/14 0001    Assessment   Medical Diagnosis Bilateral hand numbness   Referring Provider Hemang K. Manuella Ghazi MD   Onset Date/Surgical Date 10/07/14   Hand Dominance Right   Next MD Visit February 2017   Prior Therapy Patient had PT last year for LLE numbness and difficulty walking;  She responded well; has not had any therapy for hand numbness;    Precautions   Precautions None   Restrictions   Weight Bearing Restrictions No   Balance Screen   Has the patient fallen in the past 6 months No   Has the patient had a decrease in activity level because of a fear of falling?  Yes   Is the patient reluctant to leave their home because of a fear  of falling?  No   Home Environment   Additional Comments Lives in a single story home with 1 step to enter house; no railings;    Prior Function   Level of Independence Independent with basic ADLs;Independent with household mobility with device  uses cane sometimes   Leisure Retired - likes to do puzzles, house work, cooking, shopping and R hand dominant -  report having hard time putting on earrings, small buttons - hands feels like sand paper    Cognition   Overall Cognitive Status Within Functional Limits for tasks assessed   Observation/Other Assessments   Quick DASH  52.7% the higher the number the greater the disability   Sensation   Additional Comments intact light touch and deep pressure  sensation; reports decreased sensitivity along distal fingers ("feels like sandpaper")   Posture/Postural Control   Posture Comments demonstrates mild forward slumped posture with rounded shoulders. able to self correct with verbal cues   AROM   Overall AROM Comments BUE AROM is WFL; cervical AROM is WFL (reports slight discomfort with left cervical rotation however equal ROM to right cervical rotation)   Strength   Overall Strength Comments BUE grossly 4/5   Right Hand Grip (lbs) 65   Left Hand Grip (lbs) 40   Palpation   Palpation comment reports mod discomfort along left scapula paraspinals; increased tightness noted with decreased scapular mobility;    Special Tests   Cervical Tests Spurling's;Dictraction   Spurling's   Findings Positive   Side Left   Comment with increased pain during left flexion and left extension with compression   Distraction Test   Findngs Negative   Comment pt reports less numbness in B hands with cervical distraction;    Transfers   Comments able to transfer sit<>Stand independently using arm rests.   Ambulation/Gait   Gait Comments Patient ambulates without AD with slower gait speed, decreased left foot clearance, normal base of support; Patient able to demonstrate good stability;        Patient exhibits a negative phalens/reverse phalen's test indicating no carpal tunnel syndrome. She also reports less numbness/tingling with tinel's sign along median and ulnar nerve.  TREATMENT: PT performed gentle cervical distraction in supine, 10 sec hold, 5 sec rest x5 min (patient reports less numbness in bilateral hands by 25%); PT also assessed left scapular paraspinal tightness and performed myofascial release to left scapular paraspinals x3 min with less pain down LUE following treatment.  PT initiated HEP with instruction for patient to performed cervical ROM in all direction (flexion/extension, rotation/side bend) and posterior shoulder rolls to improve  posture and cervical flexibility.                     PT Education - 12/27/14 1250    Education provided Yes   Education Details HEP, plan of care   Person(s) Educated Patient   Methods Explanation   Comprehension Verbalized understanding             PT Long Term Goals - 12/27/14 1258    PT LONG TERM GOAL #1   Title Patient will decrease Quick DASH score by > 8 points demonstrating reduced self-reported upper extremity disability by 01/24/15   Time 4   Period Weeks   Status New   PT LONG TERM GOAL #2   Title Patient will be independent in home exercise program to improve strength/mobility for better functional independence with ADLs by 01/24/15   Time 4   Period Weeks  Status New   PT LONG TERM GOAL #3   Title Patient will report a worst pain of 3/10 on VAS in   neck/LUE          to improve tolerance with ADLs and reduced symptoms with activities by 01/24/15   Time 4   Period Weeks   Status New   PT LONG TERM GOAL #4   Title Patient will increase LUE grip strength to >50 pounds to demonstrate improved functional strength for ADLs including lifting/carrying objects. by 01/24/15   Time 4   Period Weeks   Status New               Plan - 2015/01/18 1251    Clinical Impression Statement 74 yo Female presents to physical therapy with bilateral hand/finger numbness and tingling. Patient exhibits ROM/strength [facility]. She does have weakness in grip strength. Patient tested positive for possible left sided radiculopathy with increased pain down LUE with cervical compression (spurlings test). She reports less tingling with cervical distraction. Patient also exhibits increased tightness along left scapular paraspinals. Patient would benefit from additional skilled PT intervention to reduce numbness and increase mobility.    Pt will benefit from skilled therapeutic intervention in order to improve on the following deficits Hypomobility;Impaired sensation;Pain;Decreased  strength;Improper body mechanics;Postural dysfunction   Rehab Potential Good   Clinical Impairments Affecting Rehab Potential positive: good response to conservative treatment from prior PT, negative: co-morbidites (cervical surgery in 2014),    PT Frequency 2x / week   PT Duration 4 weeks   PT Treatment/Interventions ADLs/Self Care Home Management;Patient/family education;Cryotherapy;Functional mobility training;Moist Heat;Therapeutic exercise;Therapeutic activities;Manual techniques;Traction;Taping   PT Next Visit Plan advance postural exercise, perform manual therapy )distraction)   PT Home Exercise Plan instructed patient in cervical ROM and posterior shoulder rolls   Consulted and Agree with Plan of Care Patient          G-Codes - Jan 18, 2015 1302    Functional Assessment Tool Used Quick dash, grip strength, clinical judgement   Functional Limitation Carrying, moving and handling objects   Carrying, Moving and Handling Objects Current Status [id number]) At least 40 percent but less than 60 percent impaired, limited or restricted   Carrying, Moving and Handling Objects Goal Status [id number]) At least 1 percent but less than 20 percent impaired, limited or restricted       Problem List There are no active problems to display for this patient.   Hopkins,Alden Feagan PT, DPT 01/18/15, 1:04 PM  [place] MAIN [facility] SERVICES [address] [place], [place], [address] Phone: [phone]   Fax:  [phone]  Name: Anna Chambers MRN: [id number] Date of Birth: 07/30/1940

## 2014-12-29 ENCOUNTER — Encounter: Payer: Self-pay | Admitting: Physical Therapy

## 2014-12-29 ENCOUNTER — Ambulatory Visit: Payer: Medicare Other | Admitting: Physical Therapy

## 2014-12-29 DIAGNOSIS — R4189 Other symptoms and signs involving cognitive functions and awareness: Secondary | ICD-10-CM | POA: Diagnosis not present

## 2014-12-29 DIAGNOSIS — R200 Anesthesia of skin: Secondary | ICD-10-CM

## 2014-12-29 DIAGNOSIS — R29898 Other symptoms and signs involving the musculoskeletal system: Secondary | ICD-10-CM

## 2014-12-29 DIAGNOSIS — R202 Paresthesia of skin: Secondary | ICD-10-CM

## 2014-12-29 NOTE — Therapy (Signed)
[place] MAIN [facility] SERVICES [address] [place], [place], [address] Phone: [phone]   Fax:  [phone]  Physical Therapy Treatment  Patient Details  Name: Anna Chambers MRN: [id number] Date of Birth: Dec 13, 1940 Referring Provider: Hemang K. Manuella Ghazi MD  Encounter Date: 12/29/2014      PT End of Session - 12/29/14 0929    Visit Number 2   Number of Visits 9   Date for PT Re-Evaluation 01/24/15   Authorization Type gcode 2   Authorization Time Period 10   PT Start Time 0848   PT Stop Time 0930   PT Time Calculation (min) 42 min   Equipment Utilized During Treatment Gait belt   Activity Tolerance Patient tolerated treatment well;No increased pain   Behavior During Therapy [facility] for tasks assessed/performed      Past Medical History  Diagnosis Date  . Hypertension   . Depression   . Sleep apnea     Past Surgical History  Procedure Laterality Date  . Vaginal hysterectomy    . Foot surgery Left   . Spine surgery    . Eye surgery      There were no vitals filed for this visit.  Visit Diagnosis:  Weakness of left hand  Numbness and tingling in left hand  Numbness and tingling in right hand      Subjective Assessment - 12/29/14 0854    Subjective Patient reports being about the same. She rates her numnbess at a 5/10 (where 0 is no numbness and 10 is worst numbness); She also complains of pain down LUE.    Pertinent History Cervical fusion 2014 (no radicular symptoms after surgery); arthritis, HTN (controlled);    How long can you sit comfortably? not limited   How long can you stand comfortably? 2 hours   How long can you walk comfortably? 1-2 hours, uses cane with long distance walking   Diagnostic tests nerve conduction test completed in bilateral hands - results were "ok" according to  MD/patient.   Patient Stated Goals "reduce numbness in hands"   Currently in Pain? Yes   Pain Score 6    Pain Location Shoulder   Pain  Orientation Left   Pain Descriptors / Indicators Sharp;Shooting   Pain Type Chronic pain   Pain Radiating Towards LUE hand   Pain Onset More than a month ago   Pain Frequency Intermittent          TREATMENT: Instructed patient in cervical AROM in all directions x10 each;  Shoulder shrugs x10  PT instructed patient in posterior shoulder rolls, however patient unable to perform shoulder rolls posteriorly due to poor mechanics despite max VCs and instruction; Red tband around door: BUE shoulder extension 2x10; BUE shoulder rows 2x10 with mod VCs to increase scapular retraction and avoid shoulder elevation; Patient required min-moderate verbal/tactile cues for correct exercise technique.  PT performed gentle cervical distraction 10 sec hold, 10 sec rest x8 min; PT performed soft/deep tissue massage along left scapular paraspinals using ASTYM edge tool with myofascial release. Patient reports less pain and stiffness following manual therapy; She reports less numbness/tingling in BUE hands following gentle cervical distraction;  Advanced HEP- see patient instructions.                         PT Education - 12/29/14 0929    Education provided Yes   Education Details HEP advanced,    Person(s) Educated Patient  Methods Explanation;Verbal cues;Handout   Comprehension Verbalized understanding;Returned demonstration;Verbal cues required             PT Long Term Goals - 12/27/14 1258    PT LONG TERM GOAL #1   Title Patient will decrease Quick DASH score by > 8 points demonstrating reduced self-reported upper extremity disability by 01/24/15   Time 4   Period Weeks   Status New   PT LONG TERM GOAL #2   Title Patient will be independent in home exercise program to improve strength/mobility for better functional independence with ADLs by 01/24/15   Time 4   Period Weeks   Status New   PT LONG TERM GOAL #3   Title Patient will report a worst pain of 3/10 on  VAS in   neck/LUE          to improve tolerance with ADLs and reduced symptoms with activities by 01/24/15   Time 4   Period Weeks   Status New   PT LONG TERM GOAL #4   Title Patient will increase LUE grip strength to >50 pounds to demonstrate improved functional strength for ADLs including lifting/carrying objects. by 01/24/15   Time 4   Period Weeks   Status New               Plan - 12/29/14 0930    Clinical Impression Statement PT instructed patient in postural strengthening exercise. Patient required extensive cues for correct posture and body mechanics. PT performed gentle cervical distraction with some relief of numbness noted in BUE hands. Also performed soft/deep tissue massage to patient's left scapular paraspinals to reduce tightness and reduce tingling in LUE. Patient reports less symptoms and improved flexibility following treatment.    Pt will benefit from skilled therapeutic intervention in order to improve on the following deficits Hypomobility;Impaired sensation;Pain;Decreased strength;Improper body mechanics;Postural dysfunction   Rehab Potential Good   Clinical Impairments Affecting Rehab Potential positive: good response to conservative treatment from prior PT, negative: co-morbidites (cervical surgery in 2014),    PT Frequency 2x / week   PT Duration 4 weeks   PT Treatment/Interventions ADLs/Self Care Home Management;Patient/family education;Cryotherapy;Functional mobility training;Moist Heat;Therapeutic exercise;Therapeutic activities;Manual techniques;Traction;Taping   PT Next Visit Plan advance postural exercise, perform manual therapy (distraction)   PT Home Exercise Plan see patient instructions   Consulted and Agree with Plan of Care Patient        Problem List There are no active problems to display for this patient.    Hopkins,Sourish Allender PT, DPT 12/29/2014, 5:09 PM  [place] MAIN [facility] SERVICES [address]  [place], [place], [address] Phone: [phone]   Fax:  [phone]  Name: LAIYA TALLEY MRN: [id number] Date of Birth: 05/16/1940

## 2014-12-29 NOTE — Patient Instructions (Signed)
Copyright  VHI. All rights reserved.  Shoulder Retraction   Tie band around door knob (sitting or standing, holding band in both hands) Facing chest height anchor, grasp ends of band and pull hands to chest, squeezing shoulder blades together. Hold _3-5seconds. Repeat _10 times. Do _2_ sessions per day. Safety Note: Be sure anchor is secure.  Copyright  VHI. All rights reserved.  Strengthening: Resisted Extension   Hold band in both hands, Pull arm back, elbow straight, squeezing shoulder blades, Repeat _10___ times per set. Do _2___ sets per session. Do __1__ sessions per day.  http://orth.exer.us/833   Copyright  VHI. All rights reserved.

## 2015-01-03 ENCOUNTER — Ambulatory Visit: Payer: Medicare Other | Admitting: Physical Therapy

## 2015-01-03 ENCOUNTER — Encounter: Payer: Self-pay | Admitting: Physical Therapy

## 2015-01-03 DIAGNOSIS — R202 Paresthesia of skin: Secondary | ICD-10-CM

## 2015-01-03 DIAGNOSIS — R4189 Other symptoms and signs involving cognitive functions and awareness: Secondary | ICD-10-CM | POA: Diagnosis not present

## 2015-01-03 DIAGNOSIS — R200 Anesthesia of skin: Secondary | ICD-10-CM

## 2015-01-03 DIAGNOSIS — R449 Unspecified symptoms and signs involving general sensations and perceptions: Secondary | ICD-10-CM

## 2015-01-03 DIAGNOSIS — R29898 Other symptoms and signs involving the musculoskeletal system: Secondary | ICD-10-CM

## 2015-01-03 DIAGNOSIS — M5010 Cervical disc disorder with radiculopathy, unspecified cervical region: Secondary | ICD-10-CM

## 2015-01-03 NOTE — Therapy (Signed)
[place] MAIN [facility] SERVICES [address] [place], [place], [address] Phone: [phone]   Fax:  [phone]  Physical Therapy Treatment  Patient Details  Name: NATION ALSMAN MRN: [id number] Date of Birth: 1941/01/23 Referring Provider: Hemang K. Manuella Ghazi MD  Encounter Date: 01/03/2015      PT End of Session - 01/03/15 1626    Visit Number 3   Number of Visits 9   Date for PT Re-Evaluation 01/24/15   Authorization Type gcode 3   Authorization Time Period 10   PT Start Time 1515   PT Stop Time 1600   PT Time Calculation (min) 45 min   Equipment Utilized During Treatment Gait belt   Activity Tolerance Patient tolerated treatment well;No increased pain   Behavior During Therapy [facility] for tasks assessed/performed      Past Medical History  Diagnosis Date  . Hypertension   . Depression   . Sleep apnea     Past Surgical History  Procedure Laterality Date  . Vaginal hysterectomy    . Foot surgery Left   . Spine surgery    . Eye surgery      There were no vitals filed for this visit.  Visit Diagnosis:  Weakness of left hand  Numbness and tingling in left hand  Numbness and tingling in right hand  Cervical disc disorder with radiculopathy of cervical region  Sensory deficit, bilateral      Subjective Assessment - 01/03/15 1522    Subjective Patient denies any LUE radicular pain along shoulder blade since last session. She reports less numbness in hands until Friday of last week ( 2 days since last session); Currently patient rates numbness in both hands as 5/10 where 0 is no numbness and 10 is complete numbness. Patient reports some compliance with HEP    Pertinent History Cervical fusion 2014 (no radicular symptoms after surgery); arthritis, HTN (controlled);    How long can you sit comfortably? not limited   How long can you stand comfortably? 2 hours   How long can you walk comfortably? 1-2 hours, uses cane with long distance  walking   Diagnostic tests nerve conduction test completed in bilateral hands - results were "ok" according to  MD/patient.   Patient Stated Goals "reduce numbness in hands"   Currently in Pain? No/denies   Pain Onset More than a month ago      TREATMENT: Warm up on Nustep level 3 BUE/BLE x5 min (unbilled);  Shoulder shrugs x15 with red tband;    Red tband around door: BUE shoulder extension 2x10; BUE shoulder rows 2x10 with mod VCs to increase scapular retraction and avoid shoulder elevation; Patient required min-moderate verbal/tactile cues for correct exercise technique.   Instructed patient in 9 hole peg test, RUE: 27 sec, LUE: 32 sec  Patient instructed in using thumb and 1st finger pinch on yellow clothespins x5 each UE Patient instructed in using thumb and 1,2 finger pinch (3 finger pinch) on red/green clothespins (#8 red, #4 green) x1 each UE Patient required min-moderate verbal/tactile cues for correct exercise technique including cues to increase pinch for better clip of clothespins.  Patient also instructed in putting small pegs in peg board (#30) each UE x1 with min VCs to avoid knocking over pegs already placed for improved coordination of small items and spatial awareness.    PT performed gentle cervical distraction 10 sec hold, 10 sec rest x10 min; Patient reports less numbness/tingling in BUE hand to just tips  of fingers. She rated numbness at a 2/10 where 0 is no numbness/tingling and 10 is completely numb.                          PT Education - 01/03/15 1626    Education provided Yes   Education Details posture, hand exercises   Person(s) Educated Patient   Methods Explanation;Verbal cues   Comprehension Verbalized understanding;Returned demonstration;Verbal cues required             PT Long Term Goals - 12/27/14 1258    PT LONG TERM GOAL #1   Title Patient will decrease Quick DASH score by > 8 points demonstrating reduced  self-reported upper extremity disability by 01/24/15   Time 4   Period Weeks   Status New   PT LONG TERM GOAL #2   Title Patient will be independent in home exercise program to improve strength/mobility for better functional independence with ADLs by 01/24/15   Time 4   Period Weeks   Status New   PT LONG TERM GOAL #3   Title Patient will report a worst pain of 3/10 on VAS in   neck/LUE          to improve tolerance with ADLs and reduced symptoms with activities by 01/24/15   Time 4   Period Weeks   Status New   PT LONG TERM GOAL #4   Title Patient will increase LUE grip strength to >50 pounds to demonstrate improved functional strength for ADLs including lifting/carrying objects. by 01/24/15   Time 4   Period Weeks   Status New               Plan - 01/03/15 1627    Clinical Impression Statement Patient instructed in postural strengthening and hand strengthening exercise. She demonstrates improved symptoms with less numbness/tingling after manual therapy. Patient was a little slower with 9 hole peg test which could be related to numbness and/or impaired coordination. She would benefit from additional skilled PT intervention to improve hand functional and reduce numbness.   Pt will benefit from skilled therapeutic intervention in order to improve on the following deficits Hypomobility;Impaired sensation;Pain;Decreased strength;Improper body mechanics;Postural dysfunction   Rehab Potential Good   Clinical Impairments Affecting Rehab Potential positive: good response to conservative treatment from prior PT, negative: co-morbidites (cervical surgery in 2014),    PT Frequency 2x / week   PT Duration 4 weeks   PT Treatment/Interventions ADLs/Self Care Home Management;Patient/family education;Cryotherapy;Functional mobility training;Moist Heat;Therapeutic exercise;Therapeutic activities;Manual techniques;Traction;Taping   PT Next Visit Plan advance postural exercise, perform manual  therapy (distraction), do hand exercise   PT Home Exercise Plan see patient instructions   Consulted and Agree with Plan of Care Patient        Problem List There are no active problems to display for this patient.   Hopkins,Anush Wiedeman PT, DPT 01/03/2015, 4:39 PM  [place] MAIN [facility] SERVICES [address] [place], [place], [address] Phone: [phone]   Fax:  [phone]  Name: GELISA GIRONDA MRN: [id number] Date of Birth: Nov 03, 1940

## 2015-01-05 ENCOUNTER — Ambulatory Visit: Payer: Medicare Other | Admitting: Physical Therapy

## 2015-01-05 ENCOUNTER — Encounter: Payer: Self-pay | Admitting: Physical Therapy

## 2015-01-05 DIAGNOSIS — R202 Paresthesia of skin: Secondary | ICD-10-CM

## 2015-01-05 DIAGNOSIS — R29898 Other symptoms and signs involving the musculoskeletal system: Secondary | ICD-10-CM

## 2015-01-05 DIAGNOSIS — R4189 Other symptoms and signs involving cognitive functions and awareness: Secondary | ICD-10-CM

## 2015-01-05 DIAGNOSIS — R200 Anesthesia of skin: Secondary | ICD-10-CM

## 2015-01-05 DIAGNOSIS — R449 Unspecified symptoms and signs involving general sensations and perceptions: Secondary | ICD-10-CM

## 2015-01-05 DIAGNOSIS — M5010 Cervical disc disorder with radiculopathy, unspecified cervical region: Secondary | ICD-10-CM

## 2015-01-05 NOTE — Therapy (Signed)
[place] MAIN [facility] SERVICES [address] [place], [place], [address] Phone: [phone]   Fax:  [phone]  Physical Therapy Treatment  Patient Details  Name: Anna Chambers MRN: [id number] Date of Birth: January 20, 1941 Referring Provider: Hemang K. Manuella Ghazi MD  Encounter Date: 01/05/2015      PT End of Session - 01/05/15 1554    Visit Number 4   Number of Visits 9   Date for PT Re-Evaluation 01/24/15   Authorization Type gcode [id number]   Authorization Time Period 10   PT Start Time [id number]   PT Stop Time 1430   PT Time Calculation (min) 45 min   Equipment Utilized During Treatment Gait belt   Activity Tolerance Patient tolerated treatment well;No increased pain   Behavior During Therapy [facility] for tasks assessed/performed      Past Medical History  Diagnosis Date  . Hypertension   . Depression   . Sleep apnea     Past Surgical History  Procedure Laterality Date  . Vaginal hysterectomy    . Foot surgery Left   . Spine surgery    . Eye surgery      There were no vitals filed for this visit.  Visit Diagnosis:  Weakness of left hand  Numbness and tingling in left hand  Numbness and tingling in right hand  Cervical disc disorder with radiculopathy of cervical region  Sensory deficit, bilateral      Subjective Assessment - 01/05/15 1352    Subjective Patient reports doing well today. She reports still having numbness and tingling in bilateral fingertips. Reports numbness at 5/10 (0 is no numbness and 10 is worst numbness);   Pertinent History Cervical fusion 2014 (no radicular symptoms after surgery); arthritis, HTN (controlled);    How long can you sit comfortably? not limited   How long can you stand comfortably? 2 hours   How long can you walk comfortably? 1-2 hours, uses cane with long distance walking   Diagnostic tests nerve conduction test completed in bilateral hands - results were "ok" according to  MD/patient.   Patient Stated  Goals "reduce numbness in hands"   Currently in Pain? No/denies   Pain Onset More than a month ago            TREATMENT: Warm up on Nustep level 2 BUE/BLE x4 min (unbilled) Flipping cards, trying to pick up just one card at a time x1 deck each UE; patient had increased difficulty as she got towards the end of the deck. Instructed patient in using 3 finger pinch on clothespins (#4 green, #9 red) x1 each UE; Required min VCs to use just a 3 finger pinch and not 4 finger. Squeezing metal gripper (28.9#) 2x10 each UE; Instructed patient in grooved peg board with metal pegs, #24 pegs x1 each UE; Educated patient in red putty (squeezing with single UE, pinching with thumb and each finger x5 each)   Patient required min-moderate verbal/tactile cues for correct exercise technique. Patient required min VCs to turn pegs correctly for grooved peg board to improve accuracy.  Patient reports less numbness following hand tasks. She denies any pain after treatment session.                        PT Education - 01/05/15 1554    Education provided Yes   Education Details hand exercises   Person(s) Educated Patient   Methods Explanation;Verbal cues   Comprehension Verbalized understanding;Returned demonstration;Verbal cues  required             PT Long Term Goals - 12/27/14 1258    PT LONG TERM GOAL #1   Title Patient will decrease Quick DASH score by > 8 points demonstrating reduced self-reported upper extremity disability by 01/24/15   Time 4   Period Weeks   Status New   PT LONG TERM GOAL #2   Title Patient will be independent in home exercise program to improve strength/mobility for better functional independence with ADLs by 01/24/15   Time 4   Period Weeks   Status New   PT LONG TERM GOAL #3   Title Patient will report a worst pain of 3/10 on VAS in   neck/LUE          to improve tolerance with ADLs and reduced symptoms with activities by 01/24/15   Time 4    Period Weeks   Status New   PT LONG TERM GOAL #4   Title Patient will increase LUE grip strength to >50 pounds to demonstrate improved functional strength for ADLs including lifting/carrying objects. by 01/24/15   Time 4   Period Weeks   Status New               Plan - 01/05/15 1555    Clinical Impression Statement Patient instructed in BUE hand/finger exercises. She had increased difficulty with manipulating small items due to numbness in fingers. Patient required min Vcs for correct technique and to turn items for better accuracy such as with grooved peg board. Patient reports less numbness after exercises. She would benefit from additional skilled PT intervention to improve UE strength and reduce numbness.    Pt will benefit from skilled therapeutic intervention in order to improve on the following deficits Hypomobility;Impaired sensation;Pain;Decreased strength;Improper body mechanics;Postural dysfunction   Rehab Potential Good   Clinical Impairments Affecting Rehab Potential positive: good response to conservative treatment from prior PT, negative: co-morbidites (cervical surgery in 2014),    PT Frequency 2x / week   PT Duration 4 weeks   PT Treatment/Interventions ADLs/Self Care Home Management;Patient/family education;Cryotherapy;Functional mobility training;Moist Heat;Therapeutic exercise;Therapeutic activities;Manual techniques;Traction;Taping   PT Next Visit Plan advance postural exercise, perform manual therapy (distraction), do hand exercise   PT Home Exercise Plan advanced with putty exercise   Consulted and Agree with Plan of Care Patient        Problem List There are no active problems to display for this patient.   Hopkins,Margaret PT, DPT 01/05/2015, 3:57 PM  [place] MAIN [facility] SERVICES [address] [place], [place], [address] Phone: [phone]   Fax:  [phone]  Name: Anna Chambers MRN: [id number] Date of  Birth: 11/11/1940

## 2015-01-13 ENCOUNTER — Ambulatory Visit: Payer: Medicare Other | Attending: Neurology | Admitting: Physical Therapy

## 2015-01-13 ENCOUNTER — Encounter: Payer: Self-pay | Admitting: Physical Therapy

## 2015-01-13 DIAGNOSIS — R29898 Other symptoms and signs involving the musculoskeletal system: Secondary | ICD-10-CM

## 2015-01-13 DIAGNOSIS — R200 Anesthesia of skin: Secondary | ICD-10-CM

## 2015-01-13 DIAGNOSIS — M5010 Cervical disc disorder with radiculopathy, unspecified cervical region: Secondary | ICD-10-CM | POA: Insufficient documentation

## 2015-01-13 DIAGNOSIS — R4189 Other symptoms and signs involving cognitive functions and awareness: Secondary | ICD-10-CM | POA: Diagnosis present

## 2015-01-13 DIAGNOSIS — M6289 Other specified disorders of muscle: Secondary | ICD-10-CM | POA: Insufficient documentation

## 2015-01-13 DIAGNOSIS — R449 Unspecified symptoms and signs involving general sensations and perceptions: Secondary | ICD-10-CM

## 2015-01-13 DIAGNOSIS — R202 Paresthesia of skin: Secondary | ICD-10-CM | POA: Insufficient documentation

## 2015-01-13 NOTE — Therapy (Signed)
[place] MAIN [facility] SERVICES [address] [place], [place], [address] Phone: [phone]   Fax:  [phone]  Physical Therapy Treatment  Patient Details  Name: Anna Chambers MRN: [id number] Date of Birth: [birth year] Referring Provider: Hemang K. Manuella Ghazi MD  Encounter Date: 01/13/2015      PT End of Session - 01/13/15 1453    Visit Number 5   Number of Visits 9   Date for PT Re-Evaluation 01/24/15   Authorization Type gcode 5   Authorization Time Period 10   PT Start Time [id number]   PT Stop Time 1530   PT Time Calculation (min) 45 min   Equipment Utilized During Treatment Gait belt   Activity Tolerance Patient tolerated treatment well;No increased pain   Behavior During Therapy [facility] for tasks assessed/performed      Past Medical History  Diagnosis Date  . Hypertension   . Depression   . Sleep apnea     Past Surgical History  Procedure Laterality Date  . Vaginal hysterectomy    . Foot surgery Left   . Spine surgery    . Eye surgery      There were no vitals filed for this visit.  Visit Diagnosis:  Weakness of left hand  Numbness and tingling in right hand  Numbness and tingling in left hand  Cervical disc disorder with radiculopathy of cervical region  Sensory deficit, bilateral      Subjective Assessment - 01/13/15 1452    Subjective Patient reports that numbness in hands feels about the same. She reports compliance with HEP; Patient reports, "I had to put my brace on my left foot today because my foot was dragging" She reports feeling more stiffness today;    Pertinent History Cervical fusion 2014 (no radicular symptoms after surgery); arthritis, HTN (controlled);    How long can you sit comfortably? not limited   How long can you stand comfortably? 2 hours   How long can you walk comfortably? 1-2 hours, uses cane with long distance walking   Diagnostic tests nerve conduction test completed in bilateral hands - results  were "ok" according to  MD/patient.   Patient Stated Goals "reduce numbness in hands"   Currently in Pain? No/denies   Pain Onset More than a month ago         TREATMENT: Warm up on Nustep level 2 BUE/BLE x5 min (unbilled) Using 3 finger pinch for putting coins in money counter #15 each UE;  Velcro finger turns: Small key turns (2 finger pinch x1 rep each direction with mod VCs for increased wrist pronation/supination for increased wrist ROM/strengthening) Large key turns with 3 finger pinch x1 rep each direction with mod VCs to increased wrist ROM for increased finger strengthening; Small dowel velcro, 2 finger pinch x1 rep on skinny velcro with min VCs to use tip of fingers for increased finger strengthening;  Flipping cards, trying to pick up just one card at a time x1 deck each UE; patient had increased difficulty as she got towards the end of the deck. Instructed patient in using 3 finger pinch on clothespins (#4 green, #6 red, #4 blue) x1 each UE; Required min VCs to use just a 3 finger pinch and not 4 finger. Squeezing metal gripper (23.4#) x15 pegs each UE; Instructed patient in grooved peg board with metal pegs, #24 pegs x1 each UE;   Patient required min-moderate verbal/tactile cues for correct exercise technique. Patient required min VCs to turn pegs correctly  for grooved peg board to improve accuracy.  Patient reports less numbness following hand tasks. She denies any pain after treatment session.                         PT Education - 01/13/15 1453    Education provided Yes   Education Details hand exercises;    Person(s) Educated Patient   Methods Explanation;Verbal cues   Comprehension Verbalized understanding;Returned demonstration;Verbal cues required             PT Long Term Goals - 12/27/14 1258    PT LONG TERM GOAL #1   Title Patient will decrease Quick DASH score by > 8 points demonstrating reduced self-reported upper extremity  disability by 01/24/15   Time 4   Period Weeks   Status New   PT LONG TERM GOAL #2   Title Patient will be independent in home exercise program to improve strength/mobility for better functional independence with ADLs by 01/24/15   Time 4   Period Weeks   Status New   PT LONG TERM GOAL #3   Title Patient will report a worst pain of 3/10 on VAS in   neck/LUE          to improve tolerance with ADLs and reduced symptoms with activities by 01/24/15   Time 4   Period Weeks   Status New   PT LONG TERM GOAL #4   Title Patient will increase LUE grip strength to >50 pounds to demonstrate improved functional strength for ADLs including lifting/carrying objects. by 01/24/15   Time 4   Period Weeks   Status New               Plan - 01/13/15 1527    Clinical Impression Statement Instructed patient in advanced hand exercise. She required VCs to increase pronation/supination for better wrist/forearm strengthening. Patient also exhibits increased fatigue with difficulty performing advanced strengthening tasks. She would benefit from additional skilled PT intervention to improve UE strength and reduce numbness in fingertips;    Pt will benefit from skilled therapeutic intervention in order to improve on the following deficits Hypomobility;Impaired sensation;Pain;Decreased strength;Improper body mechanics;Postural dysfunction   Rehab Potential Good   Clinical Impairments Affecting Rehab Potential positive: good response to conservative treatment from prior PT, negative: co-morbidites (cervical surgery in 2014),    PT Frequency 2x / week   PT Duration 4 weeks   PT Treatment/Interventions ADLs/Self Care Home Management;Patient/family education;Cryotherapy;Functional mobility training;Moist Heat;Therapeutic exercise;Therapeutic activities;Manual techniques;Traction;Taping   PT Next Visit Plan advance postural exercise, perform manual therapy (distraction), do hand exercise   PT Home Exercise Plan  continue as previously given   Consulted and Agree with Plan of Care Patient        Problem List There are no active problems to display for this patient.   Mahima Hottle PT, DPT 01/13/2015, 3:29 PM  [place] MAIN [facility] SERVICES [address] [place], [place], [address] Phone: [phone]   Fax:  [phone]  Name: Anna Chambers MRN: [id number] Date of Birth: 05-10-1940

## 2015-01-14 ENCOUNTER — Ambulatory Visit: Payer: Medicare Other | Admitting: Physical Therapy

## 2015-01-14 ENCOUNTER — Encounter: Payer: Self-pay | Admitting: Physical Therapy

## 2015-01-14 DIAGNOSIS — R200 Anesthesia of skin: Secondary | ICD-10-CM

## 2015-01-14 DIAGNOSIS — R449 Unspecified symptoms and signs involving general sensations and perceptions: Secondary | ICD-10-CM

## 2015-01-14 DIAGNOSIS — R202 Paresthesia of skin: Secondary | ICD-10-CM

## 2015-01-14 DIAGNOSIS — M5010 Cervical disc disorder with radiculopathy, unspecified cervical region: Secondary | ICD-10-CM

## 2015-01-14 DIAGNOSIS — M6289 Other specified disorders of muscle: Secondary | ICD-10-CM | POA: Diagnosis not present

## 2015-01-14 DIAGNOSIS — R29898 Other symptoms and signs involving the musculoskeletal system: Secondary | ICD-10-CM

## 2015-01-14 DIAGNOSIS — R4189 Other symptoms and signs involving cognitive functions and awareness: Secondary | ICD-10-CM

## 2015-01-14 NOTE — Therapy (Signed)
[place] MAIN [facility] SERVICES [address] [place], [place], [address] Phone: [phone]   Fax:  [phone]  Physical Therapy Treatment  Patient Details  Name: Anna Chambers MRN: [id number] Date of Birth: [birth year] Referring Provider: Hemang K. Manuella Ghazi MD  Encounter Date: 01/14/2015      PT End of Session - 01/14/15 0932    Visit Number 6   Number of Visits 9   Date for PT Re-Evaluation 01/24/15   Authorization Type gcode 5   Authorization Time Period 10   PT Start Time [phone]   PT Stop Time 0930   PT Time Calculation (min) 48 min   Equipment Utilized During Treatment Gait belt   Activity Tolerance Patient tolerated treatment well;Patient limited by pain   Behavior During Therapy WFL for tasks assessed/performed      Past Medical History  Diagnosis Date  . Hypertension   . Depression   . Sleep apnea     Past Surgical History  Procedure Laterality Date  . Vaginal hysterectomy    . Foot surgery Left   . Spine surgery    . Eye surgery      There were no vitals filed for this visit.  Visit Diagnosis:  Weakness of left hand  Numbness and tingling in right hand  Numbness and tingling in left hand  Cervical disc disorder with radiculopathy of cervical region  Sensory deficit, bilateral      Subjective Assessment - 01/14/15 0855    Subjective Patient reports continued numbness in hands and fingers. She reports no pain or soreness since last session.    Pertinent History Cervical fusion 2014 (no radicular symptoms after surgery); arthritis, HTN (controlled);    How long can you sit comfortably? not limited   How long can you stand comfortably? 2 hours   How long can you walk comfortably? 1-2 hours, uses cane with long distance walking   Diagnostic tests nerve conduction test completed in bilateral hands - results were "ok" according to  MD/patient.   Patient Stated Goals "reduce numbness in hands"   Currently in Pain?  No/denies   Pain Onset More than a month ago         Warm up on Nustep level 2 BUE/BLE x5 min (unbilled);  Standing: Single UE shoulder ER, 2.5# weight with matrix 2x10 with mod VCs to avoid trunk rotation and to keep elbow by side;  Patient attempted BUE bicep curl with matrix starting at 7.5# and then 2.5# but patient unable; Attempted BUE chest press at 2.5# however patient unable;  Standing: Green tband with BUE shoulder extension x15; Green tband low row BUE x15; Green tband BUE shoulder horizontal abduction x10; Patient required min-moderate verbal/tactile cues for correct exercise technique.  While doing UE strengthening exercise, patient reports increased discomfort in RUE shoulder along rotator cuff musculature. PT performed hawkin's kennedy/drop arm test and empty can test on RUE; Patient reports increased pain with all tests but was able to hold position. PT recommended patient follow up with MD regarding shoulder pain;    Seated: Instructed patient in checkers with velcro blocks using single UE finger extensors to pick up and place checkers x3 min each UE; Squeezing metal gripper (23.4#) x10 pegs each UE; Single UE placing small round pegs in peg board x20 each UE   Patient required min-moderate verbal/tactile cues for correct exercise technique. Patient required mod VCS to increase use of finger extensors to improve strengthening;  PT Education - 01/14/15 0932    Education provided Yes   Education Details shoulder strengthening/hand exercise   Person(s) Educated Patient   Methods Explanation;Verbal cues   Comprehension Verbalized understanding;Verbal cues required;Returned demonstration             PT Long Term Goals - 12/27/14 1258    PT LONG TERM GOAL #1   Title Patient will decrease Quick DASH score by > 8 points demonstrating reduced self-reported  upper extremity disability by 01/24/15   Time 4   Period Weeks   Status New   PT LONG TERM GOAL #2   Title Patient will be independent in home exercise program to improve strength/mobility for better functional independence with ADLs by 01/24/15   Time 4   Period Weeks   Status New   PT LONG TERM GOAL #3   Title Patient will report a worst pain of 3/10 on VAS in   neck/LUE          to improve tolerance with ADLs and reduced symptoms with activities by 01/24/15   Time 4   Period Weeks   Status New   PT LONG TERM GOAL #4   Title Patient will increase LUE grip strength to >50 pounds to demonstrate improved functional strength for ADLs including lifting/carrying objects. by 01/24/15   Time 4   Period Weeks   Status New               Plan - 01/14/15 0933    Clinical Impression Statement Instructed patient in BUE shoulder strengthening exercise. She rquired increased cues for correct exercise technique to improve strengthening. Patient reports increased RUE shoulder pain with overhead reach. PT performed various shoulder special tests and is concerned about her rotator cuff. PT recommended patient follow up with MD regarding shoulder pain. She reports that she has an appointment next week. Patient would benefit from additional skilled PT intervention to improve hand/finger strength and reduce numbness.    Pt will benefit from skilled therapeutic intervention in order to improve on the following deficits Hypomobility;Impaired sensation;Pain;Decreased strength;Improper body mechanics;Postural dysfunction   Rehab Potential Good   Clinical Impairments Affecting Rehab Potential positive: good response to conservative treatment from prior PT, negative: co-morbidites (cervical surgery in 2014),    PT Frequency 2x / week   PT Duration 4 weeks   PT Treatment/Interventions ADLs/Self Care Home Management;Patient/family education;Cryotherapy;Functional mobility training;Moist Heat;Therapeutic  exercise;Therapeutic activities;Manual techniques;Traction;Taping   PT Next Visit Plan advance postural exercise, perform manual therapy (distraction), do hand exercise   PT Home Exercise Plan continue as previously given   Consulted and Agree with Plan of Care Patient        Problem List There are no active problems to display for this patient.   Trotter,Margaret PT, DPT 01/14/2015, 9:35 AM  [place] MAIN [facility] SERVICES [address] [place], [place], [address] Phone: [phone]   Fax:  [phone]  Name: Anna Chambers MRN: [id number] Date of Birth: 1940/02/25

## 2015-01-18 ENCOUNTER — Ambulatory Visit: Payer: Medicare Other | Admitting: Physical Therapy

## 2015-01-19 ENCOUNTER — Encounter: Payer: Self-pay | Admitting: Physical Therapy

## 2015-01-19 ENCOUNTER — Ambulatory Visit: Payer: Medicare Other | Admitting: Physical Therapy

## 2015-01-19 DIAGNOSIS — R449 Unspecified symptoms and signs involving general sensations and perceptions: Secondary | ICD-10-CM

## 2015-01-19 DIAGNOSIS — R29898 Other symptoms and signs involving the musculoskeletal system: Secondary | ICD-10-CM

## 2015-01-19 DIAGNOSIS — R200 Anesthesia of skin: Secondary | ICD-10-CM

## 2015-01-19 DIAGNOSIS — R202 Paresthesia of skin: Secondary | ICD-10-CM

## 2015-01-19 DIAGNOSIS — M5010 Cervical disc disorder with radiculopathy, unspecified cervical region: Secondary | ICD-10-CM

## 2015-01-19 DIAGNOSIS — M6289 Other specified disorders of muscle: Secondary | ICD-10-CM | POA: Diagnosis not present

## 2015-01-19 DIAGNOSIS — R4189 Other symptoms and signs involving cognitive functions and awareness: Secondary | ICD-10-CM

## 2015-01-20 ENCOUNTER — Encounter: Payer: Medicare Other | Admitting: Physical Therapy

## 2015-01-20 NOTE — Therapy (Signed)
[place] MAIN [facility] SERVICES [address] [place], [place], [address] Phone: [phone]   Fax:  [phone]  Physical Therapy Treatment  Patient Details  Name: Anna Chambers MRN: [id number] Date of Birth: [birth year] Referring Provider: Hemang K. Manuella Ghazi MD  Encounter Date: 01/19/2015      PT End of Session - 01/19/15 1607    Visit Number 7   Number of Visits 9   Date for PT Re-Evaluation 01/24/15   Authorization Type gcode 7   Authorization Time Period 10   PT Start Time 1600   PT Stop Time 1647   PT Time Calculation (min) 47 min   Equipment Utilized During Treatment Gait belt   Activity Tolerance Patient tolerated treatment well;Patient limited by fatigue   Behavior During Therapy WFL for tasks assessed/performed      Past Medical History  Diagnosis Date  . Hypertension   . Depression   . Sleep apnea     Past Surgical History  Procedure Laterality Date  . Vaginal hysterectomy    . Foot surgery Left   . Spine surgery    . Eye surgery      There were no vitals filed for this visit.  Visit Diagnosis:  Weakness of left hand  Numbness and tingling in right hand  Numbness and tingling in left hand  Cervical disc disorder with radiculopathy of cervical region  Sensory deficit, bilateral      Subjective Assessment - 01/19/15 1604    Subjective Patient reports continued pain in bilateral UE shoulders in the musculature not in the joint; She reports doing a lot of walking yesterday; Denies any new symptoms; Reports continued numbness in BUE hands of 5-6/10 (where o is no numbness and 10 is completely numb);    Pertinent History Cervical fusion 2014 (no radicular symptoms after surgery); arthritis, HTN (controlled);    How long can you sit comfortably? not limited   How long can you stand comfortably? 2 hours   How long can you walk comfortably? 1-2 hours, uses cane with long distance walking   Diagnostic tests nerve  conduction test completed in bilateral hands - results were "ok" according to  MD/patient.   Patient Stated Goals "reduce numbness in hands"   Currently in Pain? Yes   Pain Score 6    Pain Location Arm   Pain Orientation Right   Pain Descriptors / Indicators Sore   Pain Type Chronic pain   Pain Onset More than a month ago      TREATMENT: Warm up on Nustep level 2 BUE/BLE x5 min (unbilled)  Small metal peg, washer board (#3 pieces) x10 each UE working on stacking small round objects on top of each other with min VCS to make sure and turn washers so that hole lines up with peg for better finger mobility;  Using 3 finger pinch for putting coins in money counter #15 each UE;  Velcro finger turns: Small key turns (2 finger pinch x1 rep each direction with mod VCs for increased wrist pronation/supination for increased wrist ROM/strengthening) Large key turns with 2 finger pinch x1 rep each direction with mod VCs to increased wrist ROM for increased finger strengthening; Small dowel velcro, 2 finger pinch x1 rep on skinny velcro with min VCs to use tip of fingers for increased finger strengthening;  Instructed patient in using 3 finger pinch on clothespins (#4 green, #4 black, #4 blue) x1 each UE; Required min VCs to use just a 3  finger pinch and not 4 finger. Squeezing metal gripper (23.4#) x 12 pegs (4 orange, 4 red, and 4 green) into peg board x2 each;  Patient required min-moderate verbal/tactile cues for correct exercise technique. Patient required min VCs to turn washers correctly for peg board to improve accuracy.                               PT Education - 01/19/15 1614    Education provided Yes   Education Details hand exercises   Person(s) Educated Patient   Methods Explanation;Verbal cues   Comprehension Verbalized understanding;Verbal cues required;Returned demonstration             PT Long Term Goals - 12/27/14 1258    PT LONG TERM GOAL #1    Title Patient will decrease Quick DASH score by > 8 points demonstrating reduced self-reported upper extremity disability by 01/24/15   Time 4   Period Weeks   Status New   PT LONG TERM GOAL #2   Title Patient will be independent in home exercise program to improve strength/mobility for better functional independence with ADLs by 01/24/15   Time 4   Period Weeks   Status New   PT LONG TERM GOAL #3   Title Patient will report a worst pain of 3/10 on VAS in   neck/LUE          to improve tolerance with ADLs and reduced symptoms with activities by 01/24/15   Time 4   Period Weeks   Status New   PT LONG TERM GOAL #4   Title Patient will increase LUE grip strength to >50 pounds to demonstrate improved functional strength for ADLs including lifting/carrying objects. by 01/24/15   Time 4   Period Weeks   Status New               Plan - 01/19/15 1615    Clinical Impression Statement Patient instructed in BUE finger and hand exercise to improve coordination and strength. She required min VCs for correct exercise technique; She exhibits increased fatigue in hands/fingers with advanced resisted exercise. Patient would benefit from additional skilled PT intervention to improve UE strength and motor control.   Pt will benefit from skilled therapeutic intervention in order to improve on the following deficits Hypomobility;Impaired sensation;Pain;Decreased strength;Improper body mechanics;Postural dysfunction   Rehab Potential Good   Clinical Impairments Affecting Rehab Potential positive: good response to conservative treatment from prior PT, negative: co-morbidites (cervical surgery in 2014),    PT Frequency 2x / week   PT Duration 4 weeks   PT Treatment/Interventions ADLs/Self Care Home Management;Patient/family education;Cryotherapy;Functional mobility training;Moist Heat;Therapeutic exercise;Therapeutic activities;Manual techniques;Traction;Taping   PT Next Visit Plan CHECK GOALS   PT  Home Exercise Plan continue as previously given   Consulted and Agree with Plan of Care Patient        Problem List There are no active problems to display for this patient.   Kelyn Koskela PT, DPT 01/20/2015, 8:42 AM  [place] MAIN [facility] SERVICES [address] [place], [place], [address] Phone: [phone]   Fax:  [phone]  Name: Anna Chambers MRN: [id number] Date of Birth: 1940/12/14

## 2015-01-21 ENCOUNTER — Encounter: Payer: Self-pay | Admitting: Physical Therapy

## 2015-01-21 ENCOUNTER — Ambulatory Visit: Payer: Medicare Other | Admitting: Physical Therapy

## 2015-01-21 DIAGNOSIS — R200 Anesthesia of skin: Secondary | ICD-10-CM

## 2015-01-21 DIAGNOSIS — R29898 Other symptoms and signs involving the musculoskeletal system: Secondary | ICD-10-CM

## 2015-01-21 DIAGNOSIS — M6289 Other specified disorders of muscle: Secondary | ICD-10-CM | POA: Diagnosis not present

## 2015-01-21 DIAGNOSIS — M5010 Cervical disc disorder with radiculopathy, unspecified cervical region: Secondary | ICD-10-CM

## 2015-01-21 DIAGNOSIS — R202 Paresthesia of skin: Secondary | ICD-10-CM

## 2015-01-21 NOTE — Therapy (Signed)
[place] MAIN [facility] SERVICES [address] [place], [place], [address] Phone: [phone]   Fax:  [phone]  Physical Therapy Treatment/Discharge Summary  Patient Details  Name: Anna Chambers MRN: [id number] Date of Birth: 1940/10/26 Referring Provider: Hemang K. Manuella Ghazi MD  Encounter Date: 01/21/2015      PT End of Session - 01/21/15 1126    Visit Number 8   Number of Visits 9   Date for PT Re-Evaluation 01/24/15   Authorization Type gcode 1   Authorization Time Period 10   PT Start Time 1016   PT Stop Time 1100   PT Time Calculation (min) 44 min   Activity Tolerance Patient tolerated treatment well;No increased pain   Behavior During Therapy [facility] for tasks assessed/performed      Past Medical History  Diagnosis Date  . Hypertension   . Depression   . Sleep apnea     Past Surgical History  Procedure Laterality Date  . Vaginal hysterectomy    . Foot surgery Left   . Spine surgery    . Eye surgery      There were no vitals filed for this visit.  Visit Diagnosis:  Weakness of left hand  Numbness and tingling in right hand  Numbness and tingling in left hand  Cervical disc disorder with radiculopathy of cervical region      Subjective Assessment - 01/21/15 1028    Subjective Patient reports continued soreness in BUE shoulders (muscles); She reports that the soreness is not constant, but it bothers her when she tries to reach or turn over at night; She reports continued numbness; Patient reports that she is going to get new earrings that are easier to put in/take out.    Pertinent History Cervical fusion 2014 (no radicular symptoms after surgery); arthritis, HTN (controlled);    How long can you sit comfortably? not limited   How long can you stand comfortably? 2 hours   How long can you walk comfortably? 1-2 hours, uses cane with long distance walking   Diagnostic tests nerve conduction test completed in bilateral hands -  results were "ok" according to  MD/patient.   Patient Stated Goals "reduce numbness in hands"   Currently in Pain? Yes   Pain Score 5    Pain Location Arm   Pain Orientation Right;Left   Pain Descriptors / Indicators Sore   Pain Type Chronic pain   Pain Onset More than a month ago            [facility] PT Assessment - 01/21/15 0001    Observation/Other Assessments   Quick DASH  25% the lower the score the greater mobility;  (improved from initial eval which was 52.7%)   Coordination   9 Hole Peg Test RUE: 24 sec, LUE: 26.5 sec (improved from initial eval where RUE: 28 sec, LUE 32 sec)   Strength   Right Hand Grip (lbs) 65   Right Hand Lateral Pinch 20 lbs  improved from 15 pounds   Right Hand 3 Point Pinch 19 lbs   Left Hand Grip (lbs) 50  improved from 40 pounds   Left Hand Lateral Pinch 15.5 lbs  improved from 14 pounds   Left Hand 3 Point Pinch 14.5 lbs       TREATMENT: Warm up on Nustep BUE/BLE level 2 x4 min (unbilled);  PT instructed patient in quick dash survey,9 hole peg test, and grip strength measures to assess progress towards goals; Please see above;  Patient continues to have numbness in BUE hands. PT has tried cervical distraction, strengthening, manual therapy etc to help reduce numbness however patient has not responded to conservative treatment;  PT educated patient in ways to improve ability to put in/take out earrings. Recommendations were to try using bigger backs for increased ease and to try the hoops with spring loaded backs to improve accuracy of closing earrings on ears; Patient verbalized understanding;  PT also educated patient in ways to advance HEP including to increase repetitions, avoid painful ROM such as shoulder flexion/abduction and IR/ER and to continue working on finger and grip strength with putty; Patient expressed understanding.                        PT Education - 01/21/15 1126    Education provided Yes    Education Details progress towards goals, recommendations, ADLs   Person(s) Educated Patient   Methods Explanation   Comprehension Verbalized understanding             PT Long Term Goals - 01/21/15 1031    PT LONG TERM GOAL #1   Title Patient will decrease Quick DASH score by > 8 points demonstrating reduced self-reported upper extremity disability by 01/24/15   Time 4   Period Weeks   Status Achieved   PT LONG TERM GOAL #2   Title Patient will be independent in home exercise program to improve strength/mobility for better functional independence with ADLs by 01/24/15   Time 4   Period Weeks   Status Achieved   PT LONG TERM GOAL #3   Title Patient will report a worst pain of 3/10 on VAS in   neck/LUE          to improve tolerance with ADLs and reduced symptoms with activities by 01/24/15   Time 4   Period Weeks   Status Not Met   PT LONG TERM GOAL #4   Title Patient will increase LUE grip strength to >50 pounds to demonstrate improved functional strength for ADLs including lifting/carrying objects. by 01/24/15   Time 4   Period Weeks   Status Achieved               Plan - 01/21/15 1126    Clinical Impression Statement Patient demonstrates significant improvement in grip strength and in functional mobility as evidenced by Quick dash survey as compared to initial eval. She reports being mostly independent in all ADLs, athough does have difficulty with putting in and taking out earrings. PT recommended different backs to earrings to improve success. Also educated patient in ways to advance HEP for continued strengthening. Based on progress towards goals, PT recommends discharge from PT at this time. Patient is agreeable. She does have increased shoulder pain which has not been evaluated by MD at this time. PT recommends that she contact her primary and/or neurologist and discuss UE pain for further recommendations.   Pt will benefit from skilled therapeutic intervention in  order to improve on the following deficits Hypomobility;Impaired sensation;Pain;Decreased strength;Improper body mechanics;Postural dysfunction   Rehab Potential Good   Clinical Impairments Affecting Rehab Potential positive: good response to conservative treatment from prior PT, negative: co-morbidites (cervical surgery in 2014),    PT Frequency 2x / week   PT Duration 4 weeks   PT Treatment/Interventions ADLs/Self Care Home Management;Patient/family education;Cryotherapy;Functional mobility training;Moist Heat;Therapeutic exercise;Therapeutic activities;Manual techniques;Traction;Taping   PT Next Visit Plan discharged from [place] continue as  previously given   Consulted and Agree with Plan of Care Patient          G-Codes - 01-29-15 1128    Functional Assessment Tool Used Quick dash, grip strength, clinical judgement   Functional Limitation Carrying, moving and handling objects   Carrying, Moving and Handling Objects Goal Status (Q2411) At least 1 percent but less than 20 percent impaired, limited or restricted   Carrying, Moving and Handling Objects Discharge Status [phone]) At least 20 percent but less than 40 percent impaired, limited or restricted      Problem List There are no active problems to display for this patient.   Alyzabeth Pontillo PT, DPT Jan 29, 2015, 11:29 AM  [place] MAIN [facility] SERVICES [address] [place], [place], [address] Phone: [phone]   Fax:  [phone]  Name: Anna Chambers MRN: [id number] Date of Birth: Feb 07, 1940

## 2015-01-24 ENCOUNTER — Encounter: Payer: Medicare Other | Admitting: Physical Therapy

## 2015-01-26 ENCOUNTER — Encounter: Payer: Medicare Other | Admitting: Physical Therapy

## 2015-02-01 ENCOUNTER — Encounter: Payer: Medicare Other | Admitting: Physical Therapy

## 2015-02-03 ENCOUNTER — Encounter: Payer: Medicare Other | Admitting: Physical Therapy

## 2015-03-10 ENCOUNTER — Other Ambulatory Visit: Payer: Self-pay | Admitting: Internal Medicine

## 2015-03-10 DIAGNOSIS — Z1231 Encounter for screening mammogram for malignant neoplasm of breast: Secondary | ICD-10-CM

## 2015-03-11 ENCOUNTER — Encounter: Payer: Self-pay | Admitting: Neurology

## 2015-03-22 ENCOUNTER — Ambulatory Visit: Payer: Medicare Other

## 2015-03-23 ENCOUNTER — Ambulatory Visit
Admission: RE | Admit: 2015-03-23 | Discharge: 2015-03-23 | Disposition: A | Discharge status: Home / Self Care | Payer: Medicare Other | Source: Ambulatory Visit | Attending: Internal Medicine | Admitting: Internal Medicine

## 2015-03-23 DIAGNOSIS — Z1231 Encounter for screening mammogram for malignant neoplasm of breast: Secondary | ICD-10-CM | POA: Insufficient documentation

## 2015-03-25 ENCOUNTER — Other Ambulatory Visit: Payer: Self-pay | Admitting: Internal Medicine

## 2015-03-25 DIAGNOSIS — R928 Other abnormal and inconclusive findings on diagnostic imaging of breast: Secondary | ICD-10-CM

## 2015-04-04 ENCOUNTER — Encounter: Payer: Self-pay | Admitting: Neurology

## 2015-04-04 ENCOUNTER — Ambulatory Visit (INDEPENDENT_AMBULATORY_CARE_PROVIDER_SITE_OTHER): Payer: Medicare Other | Admitting: Neurology

## 2015-04-04 VITALS — BP 130/80 | HR 54 | Ht 64.0 in | Wt 184.2 lb

## 2015-04-04 DIAGNOSIS — M21372 Foot drop, left foot: Secondary | ICD-10-CM

## 2015-04-04 DIAGNOSIS — M4712 Other spondylosis with myelopathy, cervical region: Secondary | ICD-10-CM

## 2015-04-04 DIAGNOSIS — M5412 Radiculopathy, cervical region: Secondary | ICD-10-CM

## 2015-04-04 DIAGNOSIS — G609 Hereditary and idiopathic neuropathy, unspecified: Secondary | ICD-10-CM | POA: Diagnosis not present

## 2015-04-04 DIAGNOSIS — G959 Disease of spinal cord, unspecified: Secondary | ICD-10-CM

## 2015-04-04 NOTE — Patient Instructions (Signed)
1.  If you choose to proceed with MRI cervical spine and/or nerve and muscle testing, please call my office 2.  We can also offer you samples for Lyrica, if you would like to try an alternative medication for the hand tingling

## 2015-04-04 NOTE — Progress Notes (Signed)
[facility] Clinic Note - Initial Visit   Date: 04/04/2015  IFRA URIVE MRN: [id number] DOB: 10-10-1940   Dear Dr. Humphrey Rolls:  Thank you for your kind referral of Anna Chambers for consultation of bilateral hand paresthesias. Although her history is well known to you, please allow [place] to reiterate it for the purpose of our medical record. The patient was accompanied to the clinic by self.    History of Present Illness: Anna Chambers is a 75 y.o. right-handed African American female with cervical myelopathy s/p ACDF at C3-C4 (2014), gout, hypertension, and glaucoma  presenting for evaluation of bilateral hand paresthesias.    In 2014, she began having numbness and tingling over the fingers bilaterally.  She feels that her hands feel rough, like sandpaper.  She had cervical decompression and fusion which helped with her neck pain, but no benefit with hand paresthesias. Symptoms have not worsened since onset, but annoy her because they are persistent.  She denies any hand weakness.  She takes gabapentin 300mg  BID but denies any benefit.    She also complains of left foot drop which started around the same time.  NCS/EMG of the legs showed L5-S1 radiculopathy and axonal peripheral neuropathy.  MRI lumbar spine did show significant disc disease at L4-L5, L5-S1 specifically left L5 nerve root per radiologist was "swollen", as per Dr. Trena Platt clinic note. She completed PT and did not have any improvement.  She walks with a left AFO and walks with a cane for long distances.  She has not had any falls.  There is no history of diabetes, alcoholism, or thyroid disease.   Starting around December 2016, she began having myalgias of the upper arms bilaterally and has difficulty raising her arms above her shoulders.  She endorses tenderness of the muscles.  No significant improvement with PT.  She has been under the case of Dr. Jennings Books and is here for a second opinion.  Overall, she  denies any worsening or new symptoms and states that her hand paresthesias and left foot drop have not improved since onset.    Out-side paper records, electronic medical record, and images have been reviewed where available and summarized as:  NCS/EMG of the upper extremities 12/13/2014:   This is an abnormal electrodiagnostic study consistent with minimal right ulnar neuropathy site unspecified.  NCS/EMG of the lower extremities 12/07/2013:  This is an abnormal electrodiagnostic exam consistent with 1) L5,S1 radiculopathy on the left. 2) a generalized Peripheral polyneuropathy.   MRI cervical spine 03/25/2012:  Multilevel cervical spinal degenerative disease as described above. Severe spinal canal stenosis at C3-4 causing abnormal signal in the cord suggesting myelopathy.    Past Medical History  Diagnosis Date  . Hypertension   . Depression   . Sleep apnea     Past Surgical History  Procedure Laterality Date  . Vaginal hysterectomy    . Foot surgery Left   . Spine surgery    . Eye surgery       Medications:  Outpatient Encounter Prescriptions as of 04/04/2015  Medication Sig Note  . aspirin 81 MG tablet Take 81 mg by mouth daily.   . bisoprolol-hydrochlorothiazide (ZIAC) 2.5-6.25 MG per tablet Take 1 tablet by mouth daily.   . Brimonidine Tartrate-Timolol (COMBIGAN OP) Apply to eye 2 (two) times daily.   . brinzolamide (AZOPT) 1 % ophthalmic suspension 1 drop 2 (two) times daily.   . colchicine 0.6 MG tablet Take 0.6 mg by mouth daily.   Marland Kitchen  gabapentin (NEURONTIN) 300 MG capsule Take 300 mg by mouth 2 (two) times daily.   . meloxicam (MOBIC) 15 MG tablet Take 15 mg by mouth daily.   Marland Kitchen oxybutynin (DITROPAN-XL) 5 MG 24 hr tablet TK 1 T PO  QD 04/04/2015: Received from: External Pharmacy  . pilocarpine (PILOCAR) 2 % ophthalmic solution 1 drop 4 (four) times daily.   Marland Kitchen ULORIC 40 MG tablet TK 1 T PO ONCE D 04/04/2015: Received from: External Pharmacy  . [DISCONTINUED] DULoxetine  (CYMBALTA) 30 MG capsule Take 30 mg by mouth daily.   . [DISCONTINUED] methylPREDNISolone (MEDROL DOSEPAK) 4 MG tablet follow package directions (Patient not taking: Reported on 12/27/2014)   . [DISCONTINUED] traMADol (ULTRAM) 50 MG tablet Take by mouth every 6 (six) hours as needed.    No facility-administered encounter medications on file as of 04/04/2015.     Allergies:  Allergies  Allergen Reactions  . Simvastatin Rash    Family History: Family History  Problem Relation Age of Onset  . Hypertension Mother   . Hypertension Father   . Cancer Father   . Kidney failure Mother   . Heart disease Mother     Social History: Social History  Substance Use Topics  . Smoking status: Never Smoker   . Smokeless tobacco: Never Used  . Alcohol Use: No   Social History   Social History Narrative   Lives with husband in a one story home.  Has one child.  Retired from  [employer] and [employer].      Review of Systems:  CONSTITUTIONAL: No fevers, chills, night sweats, or weight loss.   EYES: No visual changes or eye pain ENT: No hearing changes.  No history of nose bleeds.   RESPIRATORY: No cough, wheezing and shortness of breath.   CARDIOVASCULAR: Negative for chest pain, and palpitations.   GI: Negative for abdominal discomfort, blood in stools or black stools.  No recent change in bowel habits.   GU:  No history of incontinence.   MUSCLOSKELETAL: No history of joint pain or swelling.  + myalgias.   SKIN: Negative for lesions, rash, and itching.   HEMATOLOGY/ONCOLOGY: Negative for prolonged bleeding, bruising easily, and swollen nodes.  No history of cancer.   ENDOCRINE: Negative for cold or heat intolerance, polydipsia or goiter.   PSYCH:  No depression or anxiety symptoms.   NEURO: As Above.   Vital Signs:  BP 130/80 mmHg  Pulse 54  Ht 5\' 4"  (1.626 m)  Wt 184 lb 3 oz (83.547 kg)  BMI 31.60 kg/m2  SpO2 96%  LMP  (LMP Unknown)   General Medical Exam:   General:  Well appearing,  comfortable.   Eyes/ENT: see cranial nerve examination.   Neck: No masses appreciated.  Full range of motion without tenderness.  No carotid bruits. Respiratory:  Clear to auscultation, good air entry bilaterally.   Cardiac:  Regular rate and rhythm, no murmur.   Extremities:  No deformities, edema, or skin discoloration.  Skin:  No rashes or lesions.  Neurological Exam: MENTAL STATUS including orientation to time, place, person, recent and remote memory, attention span and concentration, language, and fund of knowledge is normal.  Speech is not dysarthric.  CRANIAL NERVES: II:  No visual field defects.  Unremarkable fundi.   III-IV-VI: Pupils equal round and reactive to light.  Normal conjugate, extra-ocular eye movements in all directions of gaze.  No nystagmus.    V:  Normal facial sensation.     VII:  Normal facial  symmetry and movements.  No pathologic facial reflexes.  VIII:  Normal hearing and vestibular function.   IX-X:  Normal palatal movement.   XI:  Normal shoulder shrug and head rotation.   XII:  Normal tongue strength and range of motion, no deviation or fasciculation.  MOTOR:  No atrophy, fasciculations or abnormal movements.  No pronator drift.  Tone is normal.    Right Upper Extremity:    Left Upper Extremity:    Deltoid  5/5   Deltoid  5/5   Biceps  5/5   Biceps  5/5   Triceps  5/5   Triceps  5/5   Wrist extensors  5/5   Wrist extensors  5/5   Wrist flexors  5/5   Wrist flexors  5/5   Finger extensors  5/5   Finger extensors  5/5   Finger flexors  5/5   Finger flexors  5/5   Dorsal interossei  5/5   Dorsal interossei  5/5   Abductor pollicis  5/5   Abductor pollicis  5/5   Tone (Ashworth scale)  0  Tone (Ashworth scale)  0   Right Lower Extremity:    Left Lower Extremity:    Hip flexors  5/5   Hip flexors  5/5   Hip extensors  5/5   Hip extensors  5/5   Knee flexors  5/5   Knee flexors  5/5   Knee extensors  5/5   Knee extensors  5/5   Dorsiflexors  5/5    Dorsiflexors  5-/5   Plantarflexors  5/5   Plantarflexors  5/5   Inversion 5/5  Inversion 5-/5  Eversion 5/5  Eversion 5-/5  Toe extensors  5/5   Toe extensors  5-/5   Toe flexors  5/5   Toe flexors  5/5   Tone (Ashworth scale)  1  Tone (Ashworth scale)  1   MSRs:  Right                                                                 Left brachioradialis 3+  brachioradialis 3+  biceps 3+  biceps 3+  triceps 3+  triceps 3+  patellar 3+  patellar 3+  ankle jerk 2+  ankle jerk 2+  Hoffman no  Hoffman no  plantar response down  plantar response down   SENSORY: Reduced pin prick and temperature over the left dorsum of the foot.  Vibration is reduced at the great toe bilaterally. Sensation in the hands is intact.  COORDINATION/GAIT: Normal finger-to- nose-finger.  Intact rapid alternating movements bilaterally.  Gait appears unsteady with mild dragging of the left foot.   IMPRESSION: 1.  Cervical myelopathy s/p ACFD at C3-4 (2014) with residual bilateral hand paresthesias. Because she has brisk reflexes and mild spasticity of the legs, I offered to repeat MRI cervical spine to look for structural pathology, but she would like to think about it.  Her NCS/EMG of the upper extremities did not show neuropathy, CTS, or radiculopathy. I offered to increase her gabapentin vs trial of Lyrica to see if we can achieve better symptom management, however, she does not wish to make any changes at this time.   2.  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  Repeat NCS/EMG of the lower extremity declined by patient.  Continue to use AFO.   3.  Myalgias, nonspecific.  Motor strength is intact.  She has completed PT.  Return to clinic as needed   The duration of this appointment visit was 45 minutes of face-to-face time with the patient.  Greater than 50% of this time was spent in counseling, explanation of diagnosis, planning of further management, and  coordination of care.   Thank you for allowing me to participate in patient's care.  If I can answer any additional questions, I would be pleased to do so.    Sincerely,    Donika K. Posey Pronto, DO

## 2015-04-05 NOTE — Progress Notes (Signed)
Note routed

## 2015-04-06 ENCOUNTER — Ambulatory Visit
Admission: RE | Admit: 2015-04-06 | Discharge: 2015-04-06 | Disposition: A | Discharge status: Home / Self Care | Payer: Medicare Other | Source: Ambulatory Visit | Attending: Internal Medicine | Admitting: Internal Medicine

## 2015-04-06 DIAGNOSIS — R928 Other abnormal and inconclusive findings on diagnostic imaging of breast: Secondary | ICD-10-CM

## 2015-06-22 ENCOUNTER — Other Ambulatory Visit: Payer: Self-pay | Admitting: *Deleted

## 2015-06-22 DIAGNOSIS — G609 Hereditary and idiopathic neuropathy, unspecified: Secondary | ICD-10-CM

## 2015-07-12 ENCOUNTER — Telehealth: Payer: Self-pay | Admitting: Neurology

## 2015-07-12 ENCOUNTER — Ambulatory Visit (INDEPENDENT_AMBULATORY_CARE_PROVIDER_SITE_OTHER): Payer: Medicare Other | Admitting: Neurology

## 2015-07-12 ENCOUNTER — Other Ambulatory Visit: Payer: Self-pay | Admitting: *Deleted

## 2015-07-12 DIAGNOSIS — G959 Disease of spinal cord, unspecified: Secondary | ICD-10-CM

## 2015-07-12 DIAGNOSIS — G609 Hereditary and idiopathic neuropathy, unspecified: Secondary | ICD-10-CM

## 2015-07-12 DIAGNOSIS — M4712 Other spondylosis with myelopathy, cervical region: Secondary | ICD-10-CM

## 2015-07-12 DIAGNOSIS — M5417 Radiculopathy, lumbosacral region: Secondary | ICD-10-CM

## 2015-07-12 DIAGNOSIS — M21372 Foot drop, left foot: Secondary | ICD-10-CM

## 2015-07-12 NOTE — Procedures (Signed)
[facility]  [place], [place]  [place], [place] [address] Tel: [phone] Fax:  [phone] Test Date:  07/12/2015  Patient: Anna Chambers DOB: 1940-10-11 Physician: Narda Amber, DO  Sex: Female Height: 5\' 4"  Ref Phys: Sharlynn Oliphant, M.D.  ID#: [id number] Temp: 354.3C Technician: Jerilynn Mages. Dean   Patient Complaints: This is a 75 year old female with diabetes mellitus referred for evaluation of left foot drop and paresthesias.  NCV & EMG Findings: Extensive electrodiagnostic testing of the left lower extremity and additional studies of the right shows: 1. Left superficial peroneal sensory response is absent. Bilateral sural and right superficial peroneal sensory responses are within normal limits. 2. Left peroneal motor response recording at the extensor digitorum brevis is markedly reduced. Left peroneal motor response recording at the tibialis anterior is within normal limits.  Bilateral tibial and right peroneal motor responses also within normal limits. 3. Bilateral tibial H reflex studies are within normal limits. 4. Chronic motor axon loss changes are affecting L5 myotomes on the left, without accompanied active denervation.  Impression: 1. 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 Absent left superficial peroneal sensory response is most likely due to a proximal location of the dorsal root ganglion which can occur at this level. 2. There is no evidence of a sensorimotor polyneuropathy or peroneal mononeuropathy.   ___________________________ Narda Amber, DO    Nerve Conduction Studies Anti Sensory Summary Table   Site NR Peak (ms) Norm Peak (ms) P-T Amp (V) Norm P-T Amp  Left Sup Peroneal Anti Sensory (Ant Lat Mall)  34.3C  12 cm NR  <4.6  >3  Right Sup Peroneal Anti Sensory (Ant Lat Mall)  34.3C  12 cm    2.2 <4.6 8.7 >3  Left Sural Anti Sensory (Lat Mall)  34.3C  Calf    3.7 <4.6 6.7 >3  Right Sural  Anti Sensory (Lat Mall)  34.3C  Calf    3.5 <4.6 7.0 >3   Motor Summary Table   Site NR Onset (ms) Norm Onset (ms) O-P Amp (mV) Norm O-P Amp Site1 Site2 Delta-0 (ms) Dist (cm) Vel (m/s) Norm Vel (m/s)  Left Peroneal Motor (Ext Dig Brev)  34.3C  Ankle    6.0 <6.0 0.4 >2.5 B Fib Ankle 8.1 33.0 41 >40  B Fib    14.1  0.3  Poplt B Fib 2.3 10.0 43 >40  Poplt    16.4  0.3         Right Peroneal Motor (Ext Dig Brev)  34.3C  Ankle    3.0 <6.0 5.9 >2.5 B Fib Ankle 6.4 32.0 50 >40  B Fib    9.4  5.0  Poplt B Fib 1.7 10.0 59 >40  Poplt    11.1  4.9         Left Peroneal TA Motor (Tib Ant)  34.3C  Fib Head    2.8 <4.5 3.8 >3 Poplit Fib Head 1.8 10.0 56 >40  Poplit    4.6  3.9         Left Tibial Motor (Abd Hall Brev)  34.3C  Ankle    4.2 <6.0 9.0 >4 Knee Ankle 8.2 36.0 44 >40  Knee    12.4  4.2         Right Tibial Motor (Abd Hall Brev)  34.3C  Ankle    3.7 <6.0 12.1 >4 Knee Ankle 7.8 38.0 49 >40  Knee    11.5  10.3  H Reflex Studies   NR H-Lat (ms) Lat Norm (ms) L-R H-Lat (ms) M-Lat (ms) HLat-MLat (ms)  Left Tibial (Gastroc)  34.3C     31.56 <35 0.95 5.17 26.39  Right Tibial (Gastroc)  34.3C     30.61 <35 0.95 5.17 25.44   EMG   Side Muscle Ins Act Fibs Psw Fasc Number Recrt Dur Dur. Amp Amp. Poly Poly. Comment  Left AntTibialis Nml Nml Nml Nml 1- Rapid Some 1+ Some 1+ Some 1+ N/A  Left Gastroc Nml Nml Nml Nml Nml Nml Nml Nml Nml Nml Nml Nml N/A  Left Flex Dig Long Nml Nml Nml Nml 1- Rapid Some 1+ Some 1+ Nml Nml N/A  Left Fibularis Long Nml Nml Nml Nml 1- Rapid Some 1+ Some 1+ Some 1+ N/A  Left Flex Hall Long Nml Nml Nml Nml Nml Nml Nml Nml Nml Nml Nml Nml N/A  Left RectFemoris Nml Nml Nml Nml Nml Nml Nml Nml Nml Nml Nml Nml N/A  Left GluteusMed Nml Nml Nml Nml 3- Mod-R Many 1+ Many 1+ Many 1+ N/A  Left Lumbo Parasp Low Nml Nml Nml Nml Nml Nml Nml Nml Nml Nml Nml Nml N/A  Right AntTibialis Nml Nml Nml Nml Nml Nml Nml Nml Nml Nml Nml Nml N/A      Waveforms:

## 2015-07-12 NOTE — Telephone Encounter (Signed)
EMG results discussed with patient which shows chronic L5 radiculopathy on the left. She has already completed PT without any improvement, so will proceed with MRI lumbar spine without contrast. She would also like to have electrodiagnostic testing of the upper extremities because of paresthesias, worse on the left.

## 2015-07-19 ENCOUNTER — Ambulatory Visit (INDEPENDENT_AMBULATORY_CARE_PROVIDER_SITE_OTHER): Payer: Medicare Other | Admitting: Neurology

## 2015-07-19 ENCOUNTER — Telehealth: Payer: Self-pay | Admitting: Neurology

## 2015-07-19 DIAGNOSIS — M4712 Other spondylosis with myelopathy, cervical region: Secondary | ICD-10-CM

## 2015-07-19 DIAGNOSIS — G959 Disease of spinal cord, unspecified: Secondary | ICD-10-CM

## 2015-07-19 DIAGNOSIS — G609 Hereditary and idiopathic neuropathy, unspecified: Secondary | ICD-10-CM

## 2015-07-19 DIAGNOSIS — M21372 Foot drop, left foot: Secondary | ICD-10-CM

## 2015-07-19 NOTE — Telephone Encounter (Signed)
Results of EMG discussed with patient which shows left C7-C8 radiculopathy, mild in degree electrically. Recommend neck PT, however patient would like to think about this as she is already starting physical therapy for her legs.

## 2015-07-19 NOTE — Procedures (Signed)
[facility]  [address], [address]  [place], [place] [address] Tel: [phone] Fax:  [phone] Test Date:  07/19/2015  Patient: Anna Chambers DOB: 1940-03-13 Physician: Narda Amber, DO  Sex: Female Height: 5\' 4"  Ref Phys: Narda Amber, DO  ID#: [id number] Temp: 32.8C Technician: M. Dean   Patient Complaints: This is a 75 year old female referred for evaluation of bilateral hand numbness and tingling.  NCV & EMG Findings: Extensive electrodiagnostic testing of the left upper extremity and additional studies of the right shows: 1. Bilateral median, ulnar, and palmar sensory responses are within normal limits. 2. Bilateral median and ulnar motor responses are within normal limits. 3. Chronic motor axon loss changes are seen affecting the C7-C8 myotomes on the left, without accompanied active denervation. These findings are not present in the right upper extremity.  Impression: 1. 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 2. There is no evidence of carpal tunnel syndrome affecting the upper extremities.   ___________________________ Narda Amber, DO    Nerve Conduction Studies Anti Sensory Summary Table   Site NR Peak (ms) Norm Peak (ms) P-T Amp (V) Norm P-T Amp  Left Median Anti Sensory (2nd Digit)  32.8C  Wrist    3.3 <3.8 12.1 >10  Right Median Anti Sensory (2nd Digit)  32.8C  Wrist    3.3 <3.8 10.3 >10  Left Ulnar Anti Sensory (5th Digit)  32.8C  Wrist    3.0 <3.2 5.8 >5  Right Ulnar Anti Sensory (5th Digit)  32.8C  Wrist    2.8 <3.2 6.8 >5   Motor Summary Table   Site NR Onset (ms) Norm Onset (ms) O-P Amp (mV) Norm O-P Amp Site1 Site2 Delta-0 (ms) Dist (cm) Vel (m/s) Norm Vel (m/s)  Left Median Motor (Abd Poll Brev)  32.8C  Wrist    3.4 <4.0 11.0 >5 Elbow Wrist 4.3 23.0 53 >50  Elbow    7.7  9.8         Right Median Motor (Abd Poll Brev)  32.8C  Wrist    3.3 <4.0 8.8 >5 Elbow Wrist 4.0 25.0  63 >50  Elbow    7.3  8.6         Left Ulnar Motor (Abd Dig Minimi)  32.8C  Wrist    3.0 <3.1 7.9 >7 B Elbow Wrist 3.9 21.0 54 >50  B Elbow    6.9  7.0  A Elbow B Elbow 1.8 10.0 56 >50  A Elbow    8.7  6.6         Right Ulnar Motor (Abd Dig Minimi)  32.8C  Wrist    2.7 <3.1 8.4 >7 B Elbow Wrist 3.7 21.0 57 >50  B Elbow    6.4  8.1  A Elbow B Elbow 1.7 10.0 59 >50  A Elbow    8.1  7.7          Comparison Summary Table   Site NR Peak (ms) Norm Peak (ms) P-T Amp (V) Site1 Site2 Delta-P (ms) Norm Delta (ms)  Left Median/Ulnar Palm Comparison (Wrist - 8cm)  32.8C  Median Palm    1.9 <2.2 35.9 Median Palm Ulnar Palm 0.0   Ulnar Palm    1.9 <2.2 21.1      Right Median/Ulnar Palm Comparison (Wrist - 8cm)  32.8C  Median Palm    1.8 <2.2 20.9 Median Palm Ulnar Palm 0.1   Ulnar Palm    1.9 <2.2 19.4  EMG   Side Muscle Ins Act Fibs Psw Fasc Number Recrt Dur Dur. Amp Amp. Poly Poly. Comment  Left 1stDorInt Nml Nml Nml Nml 1- Rapid Some 1+ Some 1+ Nml Nml N/A  Left Ext Indicis Nml Nml Nml Nml 1- Rapid Some 1+ Some 1+ Nml Nml N/A  Left PronatorTeres Nml Nml Nml Nml 1- Rapid Some 1+ Some 1+ Nml Nml N/A  Left Biceps Nml Nml Nml Nml Nml Nml Nml Nml Nml Nml Nml Nml N/A  Left Triceps Nml Nml Nml Nml 1- Mod-R Some 1+ Some 1+ Nml Nml N/A  Left Deltoid Nml Nml Nml Nml Nml Nml Nml Nml Nml Nml Nml Nml N/A  Right 1stDorInt Nml Nml Nml Nml Nml Nml Nml Nml Nml Nml Nml Nml N/A  Right Ext Indicis Nml Nml Nml Nml Nml Nml Nml Nml Nml Nml Nml Nml N/A  Right PronatorTeres Nml Nml Nml Nml Nml Nml Nml Nml Nml Nml Nml Nml N/A  Right Biceps Nml Nml Nml Nml Nml Nml Nml Nml Nml Nml Nml Nml N/A  Right Triceps Nml Nml Nml Nml Nml Nml Nml Nml Nml Nml Nml Nml N/A  Right Deltoid Nml Nml Nml Nml Nml Nml Nml Nml Nml Nml Nml Nml N/A      Waveforms:

## 2015-07-21 ENCOUNTER — Other Ambulatory Visit: Payer: Medicare Other

## 2015-07-23 ENCOUNTER — Ambulatory Visit
Admission: RE | Admit: 2015-07-23 | Discharge: 2015-07-23 | Disposition: A | Discharge status: Home / Self Care | Payer: Medicare Other | Source: Ambulatory Visit | Attending: Neurology | Admitting: Neurology

## 2015-07-23 DIAGNOSIS — G959 Disease of spinal cord, unspecified: Secondary | ICD-10-CM

## 2015-07-23 DIAGNOSIS — G609 Hereditary and idiopathic neuropathy, unspecified: Secondary | ICD-10-CM

## 2015-07-23 DIAGNOSIS — M5412 Radiculopathy, cervical region: Secondary | ICD-10-CM

## 2015-07-23 DIAGNOSIS — M21372 Foot drop, left foot: Secondary | ICD-10-CM

## 2015-07-23 DIAGNOSIS — M4712 Other spondylosis with myelopathy, cervical region: Secondary | ICD-10-CM

## 2015-07-26 ENCOUNTER — Encounter: Payer: Medicare Other | Admitting: Neurology

## 2015-07-28 ENCOUNTER — Telehealth: Payer: Self-pay | Admitting: Neurology

## 2015-07-28 NOTE — Telephone Encounter (Signed)
MRI lumbar spine results discussed with patient.  She is starting PT tomorrow, if no improvement or there is worsening weakness/paresthesias, we will refer her to neurosurgery.  I will see her again in 6-8 weeks.  Donika K. Posey Pronto, DO

## 2016-01-26 ENCOUNTER — Encounter: Payer: Self-pay | Admitting: Neurology

## 2016-01-26 ENCOUNTER — Ambulatory Visit (INDEPENDENT_AMBULATORY_CARE_PROVIDER_SITE_OTHER): Payer: Medicare Other | Admitting: Neurology

## 2016-01-26 VITALS — BP 100/68 | HR 76 | Ht 64.0 in | Wt 182.1 lb

## 2016-01-26 DIAGNOSIS — M5417 Radiculopathy, lumbosacral region: Secondary | ICD-10-CM

## 2016-01-26 DIAGNOSIS — Z981 Arthrodesis status: Secondary | ICD-10-CM | POA: Diagnosis not present

## 2016-01-26 DIAGNOSIS — M5412 Radiculopathy, cervical region: Secondary | ICD-10-CM | POA: Insufficient documentation

## 2016-01-26 DIAGNOSIS — M4712 Other spondylosis with myelopathy, cervical region: Secondary | ICD-10-CM | POA: Diagnosis not present

## 2016-01-26 DIAGNOSIS — G959 Disease of spinal cord, unspecified: Secondary | ICD-10-CM

## 2016-01-26 NOTE — Patient Instructions (Signed)
Start physical therapy for arms and left leg. Return to clinic in 4 months

## 2016-01-26 NOTE — Progress Notes (Signed)
[facility] Follow-up Visit   Date: 01/26/16   History of Present Illness: Anna Chambers is a 75 y.o. 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    In 2014, she began having numbness and tingling over the fingers bilaterally.  She feels that her hands feel rough, like sandpaper.  She had cervical decompression and fusion which helped with her neck pain, but no benefit with hand paresthesias. Symptoms have not worsened since onset, but annoy her because they are persistent.  She denies any hand weakness.  She takes gabapentin 300mg  BID but denies any benefit.    She also complains of left foot drop which started around the same time.  NCS/EMG of the legs showed L5-S1 radiculopathy and axonal peripheral neuropathy.  MRI lumbar spine did show significant disc disease at L4-L5, L5-S1 specifically left L5 nerve root per radiologist was "swollen", as per Dr. Trena Platt clinic note. She completed PT and did not have any improvement.  She walks with a left AFO and walks with a cane for long distances.  She has not had any falls.  There is no history of diabetes, alcoholism, or thyroid disease.   Starting around December 2016, she began having myalgias of the upper arms bilaterally and has difficulty raising her arms above her shoulders.  She endorses tenderness of the muscles.  No significant improvement with PT.  She has been under the case of Dr. Jennings Books and is here for a second opinion.  Overall, she denies any worsening or new symptoms and states that her hand paresthesias and left foot drop have not improved since onset.   UPDATE 01/26/2016:  Patient was last seen in the clinic in February 2017.  Today, she presents with the ongoing issues of numbness involving both hands.  Symptoms have been persistence since 2014, before she had cervical  surgery and have not improved.  She difficulty with fine motor tasks, specifically feeling objects.  There is no weakness.  She continues to have constant numbness of the left dorsum of the foot and first three toes.  She does not have foot drop any more, but sometimes will notice dragging of the left foot, especially with prolonged walking.  She denies any neck or low back pain.  Electrodiagnostic testing performed in June of the upper and lower extremities shows left C7-8 and left L5 radiculopathy.  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   Medications:  Outpatient Encounter Prescriptions as of 01/26/2016  Medication Sig Note  . aspirin 81 MG tablet Take 81 mg by mouth daily.   . B Complex Vitamins (VITAMIN-B COMPLEX) TABS Take by mouth. 01/26/2016: Received from: [place]: Take 1 tablet by mouth once daily.  . bisoprolol-hydrochlorothiazide (ZIAC) 2.5-6.25 MG per tablet Take 1 tablet by mouth daily.   . brimonidine-timolol (COMBIGAN) 0.2-0.5 % ophthalmic solution Administer 10 drops to both eyes Two (2) times a day. Frequency:PHARMDIR   Dosage:86ml     Instructions:  Note:Instill one drop in each eye twice daily. Dose: 1 01/26/2016: Received from: [facility]  . brinzolamide (AZOPT) 1 % ophthalmic suspension Administer 1 drop to both eyes Two (2) times a day (at 8am and 5pm). Frequency:PHARMDIR   Dosage:0.0     Instructions:  Note:Instill one drop in each  eye twice a day. Dose: 1 01/26/2016: Received from: [facility]  . Calcium Carb-Cholecalciferol 600-800 MG-UNIT TABS Take 600 mg by mouth. 01/26/2016: Received from: [place]: Take 600 mg by mouth daily. Frequency:QD   Dosage:0.0     Instructions:  Note:Dose: 600 MG-400  . colchicine 0.6 MG tablet Take 0.6 mg by mouth daily.   . DOCOSAHEXAENOIC ACID PO Take by mouth. 01/26/2016: Received from:  [place]: Take 2 g by mouth daily.  Marland Kitchen gabapentin (NEURONTIN) 300 MG capsule Take 300 mg by mouth 2 (two) times daily.   Marland Kitchen latanoprost (XALATAN) 0.005 % ophthalmic solution Administer 2.5 mL to both eyes nightly. Frequency:QD   Dosage:0.0     Instructions:  Note:Dose: 0.005 % 01/26/2016: Received from: [facility]  . meloxicam (MOBIC) 15 MG tablet Take 15 mg by mouth daily.   Marland Kitchen oxybutynin (DITROPAN-XL) 5 MG 24 hr tablet TK 1 T PO  QD 04/04/2015: Received from: External Pharmacy  . pilocarpine (PILOCAR) 2 % ophthalmic solution 1 drop 4 (four) times daily.   Marland Kitchen ULORIC 40 MG tablet TK 1 T PO ONCE D 04/04/2015: Received from: External Pharmacy  . [DISCONTINUED] pilocarpine (PILOCAR) 2 % ophthalmic solution Administer 15 mL to the right eye Four (4) times a day. Frequency:QD   Dosage:0.0     Instructions:  Note:Dose: 2 % 01/26/2016: Received from: [facility]  . [DISCONTINUED] Brimonidine Tartrate-Timolol (COMBIGAN OP) Apply to eye 2 (two) times daily.   . [DISCONTINUED] brinzolamide (AZOPT) 1 % ophthalmic suspension 1 drop 2 (two) times daily.    No facility-administered encounter medications on file as of 01/26/2016.      Allergies:  Allergies  Allergen Reactions  . Simvastatin Rash    Review of Systems:  CONSTITUTIONAL: No fevers, chills, night sweats, or weight loss.   EYES: No visual changes or eye pain ENT: No hearing changes.  No history of nose bleeds.   RESPIRATORY: No cough, wheezing and shortness of breath.   CARDIOVASCULAR: Negative for chest pain, and palpitations.   GI: Negative for abdominal discomfort, blood in stools or black stools.  No recent change in bowel habits.   GU:  No history of incontinence.   MUSCLOSKELETAL: No history of joint pain or swelling.  + myalgias.   SKIN: Negative for lesions, rash, and itching.   HEMATOLOGY/ONCOLOGY: Negative for prolonged bleeding, bruising easily, and swollen nodes.  No history of cancer.   ENDOCRINE:  Negative for cold or heat intolerance, polydipsia or goiter.   PSYCH:  No depression or anxiety symptoms.   NEURO: As Above.   Vital Signs:  BP 100/68   Pulse 76   Ht 5\' 4"  (1.626 m)   Wt 182 lb 2 oz (82.6 kg)   LMP  (LMP Unknown)   SpO2 96%   BMI 31.26 kg/m   Neurological Exam: MENTAL STATUS including orientation to time, place, person, recent and remote memory, attention span and concentration, language, and fund of knowledge is normal.  Speech is not dysarthric.  CRANIAL NERVES: Pupils equal round and reactive to light.  Normal conjugate, extra-ocular eye movements in all directions of gaze.  No nystagmus.  Right ptosis (mild).  No facial weakness.   MOTOR:  Motor strength is 5/5 throughout including left foot dorsiflexion, eversion, and toe extension.  No atrophy, fasciculations or abnormal movements.  No pronator drift.  Tone is increased in the legs 0+.    MSRs:  Right  Left brachioradialis 3+  brachioradialis 3+  biceps 3+  biceps 3+  triceps 3+  triceps 3+  patellar 3+  patellar 3+  ankle jerk 2+  ankle jerk 2+  Hoffman no  Hoffman no  plantar response down  plantar response down   SENSORY: Reduced pin prick and temperature over the left dorsum of the foot.  Vibration is reduced at the great toe bilaterally. Sensation reduced to temperature over the dorsum of the hands bilaterally.  COORDINATION/GAIT:.  Gait appears unsteady with mild dragging of the left foot.  DATA:  NCS/EMG of the upper extremities 12/13/2014:   This is an abnormal electrodiagnostic study consistent with minimal right ulnar neuropathy site unspecified.  NCS/EMG of the lower extremities 12/07/2013:  This is an abnormal electrodiagnostic exam consistent with 1) L5,S1 radiculopathy on the left. 2) a generalized Peripheral polyneuropathy.   MRI cervical spine 03/25/2012:  Multilevel cervical spinal degenerative disease as described above.  Severe spinal canal stenosis at C3-4 causing abnormal signal in the cord suggesting myelopathy.   NCS/EMG of the upper extremities 07/19/2015:   1. 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 2. There is no evidence of carpal tunnel syndrome affecting the upper extremities.  NCS/EMG of the legs 07/12/2015:   1.  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 Absent left superficial peroneal sensory response is most likely due to a proximal location of the dorsal root ganglion which can occur at this level. 2.  There is no evidence of a sensorimotor polyneuropathy or peroneal mononeuropathy.  MRI lumbar spine wo contrast 07/23/2015: No marked change in the appearance of the lumbar spine.  Spondylosis appears worst at L2-3 where there is moderate central canal stenosis with narrowing in the right lateral recess and foramen which could impact the exiting right L2 and/or descending right L3 roots. Moderate left foraminal narrowing is also identified at this level.  Prominent disc bulge extending from the left lateral recess into the left paravertebral space at L5-S1 could irritate the exiting and exited left L5 root. Disc contacts the descending left S1 root without compression or displacement.  IMPRESSION: 1.  Cervical myelopathy s/p ACFD at C3-4 (2014) with residual bilateral hand paresthesias. I explained that the persistent numbness of the hands is most likely permanent neurological deficits from her initial cord compression.   Because she has brisk reflexes and mild spasticity of the legs, I offered to repeat MRI cervical spine to look for structural pathology, but she would like to think about it.  Her NCS/EMG of the upper extremities did not show neuropathy or CTS.  There is mild left C7-8 radiculopathy.  Referral will be placed to restart occupational therapy.  2.  Left foot paresthesias most likely  due to L5 radiculopathy which is seen on EDX and imaging.  She does not have weakness, which is an improvement.  Start physical therapy.    Return to clinic in 4 months   The duration of this appointment visit was 25 minutes of face-to-face time with the patient.  Greater than 50% of this time was spent in counseling, explanation of diagnosis, planning of further management, and coordination of care.   Thank you for allowing me to participate in patient's care.  If I can answer any additional questions, I would be pleased to do so.    Sincerely,    Jameila Keeny K. Posey Pronto, DO

## 2016-02-02 ENCOUNTER — Other Ambulatory Visit: Payer: Self-pay | Admitting: Internal Medicine

## 2016-02-02 DIAGNOSIS — Z1231 Encounter for screening mammogram for malignant neoplasm of breast: Secondary | ICD-10-CM

## 2016-02-07 ENCOUNTER — Ambulatory Visit: Payer: Medicare Other | Admitting: Occupational Therapy

## 2016-02-07 ENCOUNTER — Encounter: Payer: Self-pay | Admitting: Occupational Therapy

## 2016-02-07 ENCOUNTER — Ambulatory Visit: Payer: Medicare Other | Attending: Neurology | Admitting: Physical Therapy

## 2016-02-07 ENCOUNTER — Encounter: Payer: Self-pay | Admitting: Physical Therapy

## 2016-02-07 DIAGNOSIS — R2681 Unsteadiness on feet: Secondary | ICD-10-CM | POA: Insufficient documentation

## 2016-02-07 DIAGNOSIS — R262 Difficulty in walking, not elsewhere classified: Secondary | ICD-10-CM | POA: Insufficient documentation

## 2016-02-07 DIAGNOSIS — R202 Paresthesia of skin: Secondary | ICD-10-CM | POA: Diagnosis present

## 2016-02-07 DIAGNOSIS — R29898 Other symptoms and signs involving the musculoskeletal system: Secondary | ICD-10-CM | POA: Diagnosis present

## 2016-02-07 DIAGNOSIS — R278 Other lack of coordination: Secondary | ICD-10-CM

## 2016-02-07 DIAGNOSIS — M6281 Muscle weakness (generalized): Secondary | ICD-10-CM

## 2016-02-07 DIAGNOSIS — R200 Anesthesia of skin: Secondary | ICD-10-CM | POA: Insufficient documentation

## 2016-02-07 NOTE — Patient Instructions (Addendum)
Bridge    Lying on back, legs bent 90, feet flat on floor. Press up hips and torso.  Remember to tighten your buttocks muscles as you perform this exercise. Repeat 10 times and complete 3 sets. Do this exercise 3 times each day.  Copyright  VHI. All rights reserved.  ANKLE: Dorsiflexion (Band)    Sit at edge of surface. Place band around top of foot. Keeping heel on floor, raise toes of banded foot. Use red band. 10 reps per set, 4 sets per day, 5 days per week  Copyright  VHI. All rights reserved.

## 2016-02-07 NOTE — Therapy (Signed)
[place] MAIN [facility] SERVICES [address] [place], [place], [address] Phone: [phone]   Fax:  [phone]  Occupational Therapy Evaluation  Patient Details  Name: Anna Chambers MRN: [id number] Date of Birth: 08-29-1940 Referring Provider: Dr. Benjamine Sprague  Encounter Date: 02/07/2016      OT End of Session - 02/07/16 1644    Visit Number 1   Number of Visits 1   Date for OT Re-Evaluation 05/01/16   Authorization Type Medicare G-code 1 of 10   Authorization - Number of Visits 24   OT Start Time [id number]   OT Stop Time 1630   OT Time Calculation (min) 60 min   Activity Tolerance Patient tolerated treatment well   Behavior During Therapy [facility] for tasks assessed/performed      Past Medical History:  Diagnosis Date  . Cervical myelopathy with cervical radiculopathy   . Depression   . Hypertension   . Sleep apnea     Past Surgical History:  Procedure Laterality Date  . EYE SURGERY    . FOOT SURGERY Left   . SPINE SURGERY     ACDF C3-4  . VAGINAL HYSTERECTOMY      There were no vitals filed for this visit.      Subjective Assessment - 02/07/16 1542    Subjective  Pt. reports having cervical neck surgery in 2014. Pt. reports following surgery numbness has improved. Pt has noticed a worsening in bilateral hand numbness over this past summer.   Pertinent History Pt. presents with numbness and tingling in bilateral hands. Pt. had a cervical Fusion in 2014. Numbness has improved  after surgery, and following therapy in 2016. Pt. has had a worsening of numbness and tingling, and now presents for skilled OT services secondary to numbness interfering with ADL tasks.   Currently in Pain? No/denies   Pain Score 0-No pain           [facility] OT Assessment - 02/07/16 1548      Assessment   Diagnosis 2014   Referring Provider Dr. Benjamine Sprague   Onset Date 03/27/12     Precautions   Precautions Fall     Restrictions   Weight Bearing  Restrictions No     Balance Screen   Has the patient fallen in the past 6 months No   Has the patient had a decrease in activity level because of a fear of falling?  Yes   Is the patient reluctant to leave their home because of a fear of falling?  No     Home  Environment   Family/patient expects to be discharged to: Private residence   Living Arrangements Spouse/significant other   Available Help at Discharge Family   Type of [place] One level   Bathroom Shower/Tub Tub/Shower unit   Shower/tub characteristics Curtain   Bathroom Accessibility Yes   Home Equipment [place] - 2 wheels;[place] - single point   Lives With Spouse     Prior Function   Level of Independence Independent   Vocation Retired   Leisure Tourist information centre manager, shopping with friends, TV     ADL   Lower Body Bathing Minimal assistance  Assist with back   Upper Body Dressing Increased time   Lower Body Dressing Increased time   Engineer, mining Modified independent  shower chair     IADL  Prior Level of Function Shopping --  Difficulty handling change   Prior Level of Function Light Housekeeping managing a mop  peeling potaoes, and cutting meat/vegitables   Light Housekeeping Does personal laundry completely   Meal Prep --  peeling potatoes, cutting meat   Community Mobility Drives own vehicle   Medication Management Is responsible for taking medication in correct dosages at correct time  manipulating small pills.   Financial Management Manages financial matters independently (budgets, writes checks, pays rent, bills goes to bank), collects and keeps track of income     Mobility   Mobility Status Independent     Written Expression   Dominant Hand Right   Handwriting 100% legible     Vision - History   Baseline Vision Wears glasses only for reading   Visual History Cataracts   surgery in 2014     Activity Tolerance   Activity Tolerance Tolerates 10-20 min activity with muiltiple rests     Cognition   Overall Cognitive Status Within Functional Limits for tasks assessed     Sensation   Light Touch Appears Intact   Hot/Cold Appears Intact     Coordination   Right 9 Hole Peg Test 25   Left 9 Hole Peg Test 36     Strength   Right Hand Grip (lbs) 60   Right Hand Lateral Pinch 16 lbs   Right Hand 3 Point Pinch 24 lbs   Left Hand Grip (lbs) 45   Left Hand Lateral Pinch 14 lbs   Left Hand 3 Point Pinch 15 lbs     Hand Function   Right Hand Grip (lbs) 60   Right Hand Lateral Pinch 16 lbs   Right Hand 3 Point Pinch 24 lbs   Left Hand Grip (lbs) 45   Left Hand Lateral Pinch 14 lbs   Left 3 point pinch 15 lbs                              OT Long Term Goals - 02/07/16 1739      OT LONG TERM GOAL #1   Title Pt. will improve left hand [facility] skills by 3 sec. to be able to handle change efficiently when shopping.   Baseline Right 25 sec., Left 36 sec. Pt. has difficulty   Time 12   Period Weeks   Status New     OT LONG TERM GOAL #2   Title Pt. will improve Left grip strength to be able to independently hold and pour from a pitcher.   Baseline Right: 60#, Left 45#   Time 12   Period Weeks   Status New     OT LONG TERM GOAL #3   Title Pt. will improve left pinch strength to be able to cut meat/veggies, and peel potatoes independently and efficiently.   Baseline Pt. has difficulty   Time 12   Period Weeks   Status New     OT LONG TERM GOAL #4   Title Pt. will independently and efficiently button shirts, and pants.   Baseline Pt. has difficulty   Time 12   Period Weeks   Status New     OT LONG TERM GOAL #5   Title Pt. will be able to independently apply earrings   Baseline Pt. has difficulty   Time 12   Period Weeks   Status New  Plan - 2016-02-21 1646    Clinical Impression Statement Pt. presents  with numbness and tingling in the bilateral hands, with the left being worse than the right. Pt. had a cervical fusion in 2014. Pt. reports the numbness improved, however has worsened and now presents to OT to work on improving UE functioing. Pt. could benefit from skilled OT services to work on  strength, and [facility] skills needed for ADL and IADL tasks including: applying earrings, buttoning, using a nail clipper, cuttiing meat, peeling potatoes, cutting veggies, picking up and pouring from a pitcher, opening jars, handling money, and coins, obtaining items from a wallet efficiently.  Pt. scored a sum score of 57 on the MAM-20  for neurological conditions.   Rehab Potential Good   Clinical Impairments Affecting Rehab Potential Positive indicators: motivation, family support, age. Negative Indicators: multiple comorbidities.   OT Frequency 2x / week   OT Duration 12 weeks   OT Treatment/Interventions Self-care/ADL training;Therapeutic exercise;Patient/family education;Therapeutic exercises;Energy conservation   Consulted and Agree with Plan of Care Patient      Patient will benefit from skilled therapeutic intervention in order to improve the following deficits and impairments:  Decreased coordination, Decreased strength, Impaired UE functional use, Decreased range of motion, Decreased knowledge of use of DME  Visit Diagnosis: Muscle weakness (generalized)  Other lack of coordination      G-Codes - 02/21/2016 1707    Functional Assessment Tool Used MAM-20, 9-hole peg test,  dynamometer, pinch gauge, clinical judgement.   Functional Limitation Self care   Self Care Current Status [phone]) At least 1 percent but less than 20 percent impaired, limited or restricted   Self Care Goal Status [id number]) 0 percent impaired, limited or restricted      Problem List Patient Active Problem List   Diagnosis Date Noted  . Cervical myelopathy with cervical radiculopathy 01/26/2016  . Lumbosacral radiculopathy  01/26/2016  . S/P cervical spinal fusion 01/26/2016    [name], MS, OTR/L 2016-02-21, 5:47 PM  [place] MAIN [facility] SERVICES [address] [place], [place], [address] Phone: [phone]   Fax:  [phone]  Name: Anna Chambers MRN: [id number] Date of Birth: Jul 28, 1940

## 2016-02-07 NOTE — Therapy (Signed)
[place] MAIN [facility] SERVICES [address] [place], [place], [address] Phone: [phone]   Fax:  [phone]  Physical Therapy Treatment  Patient Details  Name: Anna Chambers MRN: [id number] Date of Birth: Feb 18, 1940 Referring Provider: Alda Berthold  Encounter Date: 02/07/2016      PT End of Session - 02/07/16 1552    Visit Number 1   Number of Visits 17   Date for PT Re-Evaluation 2016-04-04   Authorization Type G codes   Authorization Time Period 1/10   PT Start Time 1432   PT Stop Time 1533   PT Time Calculation (min) 61 min   Equipment Utilized During Treatment Gait belt   Activity Tolerance Patient tolerated treatment well   Behavior During Therapy [facility] for tasks assessed/performed      Past Medical History:  Diagnosis Date  . Cervical myelopathy with cervical radiculopathy   . Depression   . Hypertension   . Sleep apnea     Past Surgical History:  Procedure Laterality Date  . EYE SURGERY    . FOOT SURGERY Left   . SPINE SURGERY     ACDF C3-4  . VAGINAL HYSTERECTOMY      There were no vitals filed for this visit.      Subjective Assessment - 02/07/16 1541    Subjective LLE weakness    Pertinent History Pt presents today with LLE weakness and impaired sensation which she reports began back in 2014 following her ACDF C3-4.  She received PT at this clinic at the end of last year for ~6 weeks with some improvement.  Pt reports she feels week and has poor control of her LLE from her L knee down to her foot.  Pt not wearing AFO to PT evaluation but pt reports she wears it and uses SPC when ambulating longer distances.  Does not use AD or AFO at home.  Denies any falls in the past 6 months. Pt feels as though she drags her L foot when ambulating.  Pt reports she enjoys walking at the park, can walk ~400 yards before needing to rest.  Still driving and independent with bathing, cooking.  Pt's goal is to strengthen her LLE and to  decrease numbness and tingling in Bil hands (will work mainly with OT on UEs).   Pt with h/o Bil numbness/tingling from shoulder down to fingertips L>R).   Pt still with difficulty putting on earrings, opening cans, and buttoning her shirt (OT notified).  Has her husband assist with these tasks. 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    How long can you walk comfortably? ~400 yards   Diagnostic tests NCS/EMG   Patient Stated Goals to be able to walk without dragging L foot; to strengthen LLE   Currently in Pain? No/denies   Multiple Pain Sites No            [facility] PT Assessment - 02/07/16 1451      Assessment   Medical Diagnosis Lumbosacral Radiculopathy   Referring Provider Alda Berthold   Onset Date/Surgical Date 03/09/12   Hand Dominance Right   Next MD Visit 04/22/16 with Dr. Posey Pronto in [place]   Prior Therapy Yes, ~1 year ago for ~6 weeks with some improvement  Precautions   Precautions Fall     Restrictions   Weight Bearing Restrictions No     Balance Screen   Has the patient fallen in the past 6 months No   Has the patient had a decrease in activity level because of a fear of falling?  No   Is the patient reluctant to leave their home because of a fear of falling?  No     Home Environment   Living Environment Private residence   Living Arrangements Spouse/significant other   Available Help at Discharge Family;Available 24 hours/day   Type of [place] to enter   Entrance Stairs-Number of Steps 2   Entrance Stairs-Rails None   Home Layout One level   [place] - single point;Tub bench;Walker - 2 wheels     Prior Function   Level of Independence Independent with gait;Independent with transfers;Independent with basic  ADLs;Independent with household mobility without device;Independent with community mobility with device   Vocation Retired   Leisure Pt enjoys shopping, watch TV, word searches.     Cognition   Overall Cognitive Status Within Functional Limits for tasks assessed     Posture/Postural Control   Posture/Postural Control Postural limitations   Postural Limitations Rounded Shoulders;Forward head;Increased thoracic kyphosis;Flexed trunk     ROM / Strength   AROM / PROM / Strength Strength     Strength   Overall Strength Deficits   Strength Assessment Site Hip;Knee;Ankle   Right/Left Hip Right;Left   Right Hip Flexion 5/5   Right Hip Extension 4/5   Right Hip External Rotation  5/5   Right Hip Internal Rotation 5/5   Right Hip ABduction 5/5   Right Hip ADduction 5/5   Left Hip Flexion 3+/5   Left Hip Extension 4-/5   Left Hip External Rotation 3+/5   Left Hip Internal Rotation 4+/5   Left Hip ABduction 4-/5   Left Hip ADduction 3+/5   Right/Left Knee Right;Left   Right Knee Flexion 4/5   Right Knee Extension 5/5   Left Knee Flexion 4-/5   Left Knee Extension 4/5   Right/Left Ankle Right;Left   Right Ankle Dorsiflexion 5/5   Left Ankle Dorsiflexion 3/5     Standardized Balance Assessment   Standardized Balance Assessment Berg Balance Test     Berg Balance Test   Sit to Stand Able to stand without using hands and stabilize independently   Standing Unsupported Able to stand safely 2 minutes   Sitting with Back Unsupported but Feet Supported on Floor or Stool Able to sit safely and securely 2 minutes   Stand to Sit Sits safely with minimal use of hands   Transfers Able to transfer safely, minor use of hands   Standing Unsupported with Eyes Closed Able to stand 10 seconds with supervision   Standing Ubsupported with Feet Together Able to place feet together independently and stand for 1 minute with supervision   From Standing, Reach Forward with Outstretched Arm Can reach  forward >5 cm safely (2")   From Standing Position, Pick up Object from Floor Able to pick up shoe, needs supervision   From Standing Position, Turn to Look Behind Over each Shoulder Looks behind one side only/other side shows less weight shift   Turn 360 Degrees Able to turn 360 degrees safely but slowly   Standing Unsupported, Alternately Place Feet on Step/Stool Able to stand independently and safely and complete 8 steps in 20 seconds   Standing  Unsupported, One Foot in [place] to place foot tandem independently and hold 30 seconds   Standing on One Leg Tries to lift leg/unable to hold 3 seconds but remains standing independently   Total Score 45   Berg comment: Pt scored a 45/56 indicating pt is at an increased risk of falling        EXAMINATION   Reflexes:  Patellar: 3+ Bil  Ankle Jerk: 2+ Bil   Sensation: diminished entire LLE (symptoms more severe 1-3 digits)  Proprioception: diminished proprioception digits 1-2. WNL ankle.   Ambulation: Dec L hip flexion, knee flexion, DF. Pt uses momentum to extend L knee. L hip IR. Pt drifting L. Trendelenberg Bil (L>R).   Outcome measures completed and results explained to the pt (all completed without AD):  5xSTS: 14.17 sec  47mWT: 1.05 m/s  Berg Balance Test: 45/56    TREATMENT  Bridging with cues for even distribution between L and R LEs when pushing up into bridge. Cues for glute activation. 3x10. Fatigue noted at end of each set. (added to HEP)  Seated L DF with RTB resist 2x10. (added to HEP, pt has RTB at home)           PT Education - 02/07/16 1515    Education provided Yes   Education Details HEP; POC; role of PT   Person(s) Educated Patient   Methods Explanation;Demonstration;Verbal cues;Handout   Comprehension Verbalized understanding;Returned demonstration;Need further instruction;Verbal cues required             PT Long Term Goals - 02/07/16 1726      PT LONG TERM GOAL #1   Title Pt will improve  5xSTS by at least 3 seconds to demonstrate improved BLE strenth and balance   Baseline 14.17 sec   Time 6   Period Weeks   Status New     PT LONG TERM GOAL #2   Title L DF and hip F strength will improve to 4/5 for decreased risk of falling   Baseline 3+/5 L hip F, 3/5 L DF   Time 8   Period Weeks   Status New     PT LONG TERM GOAL #3   Title Pt will improve Berg Balance Test score to at least 52/56 to demonstrate improved balance and decreased risk of falling   Baseline 45/56   Time 8   Period Weeks   Status New     PT LONG TERM GOAL #4   Title Pt will be able to ambulate in community without use of SPC to demonstrate improved balance and endurance.   Baseline SPC for community distances   Time 8   Period Weeks   Status New               Plan - 02/07/16 1739    Clinical Impression Statement Pt presents with impaired sensation, proprioception, strength, and functional use of LLE with onset following ACDF in February of 2014.  Her goals with therapy are to improve her strength in her LLE and to be able to ambulate without dragging her L foot.  She demonstrates weakness in L hip, knee, and ankle musculature and demonstrates weakness through her 5xSTS.  She demonstrates imapaired balance by scoring 45/56 on the [employer].  She will benefit from skilled PT interventions to address the impairments listed and to improve QOL.  She will receive OT to address UE impairments.   Rehab Potential Fair   Clinical Impairments Affecting Rehab Potential (- and +)  already received PT for same symptoms with some improvement.  (-) chronicity of symptoms since 2014.  (+) pt very positive and motivated.   PT Frequency 2x / week   PT Duration 8 weeks   PT Treatment/Interventions ADLs/Self Care Home Management;Aquatic Therapy;Biofeedback;Electrical Stimulation;DME Instruction;Gait training;Stair training;Functional mobility training;Therapeutic activities;Therapeutic exercise;Balance  training;Neuromuscular re-education;Patient/family education;Orthotic Fit/Training;Manual techniques;Passive range of motion;Dry needling;Energy conservation;Taping;Visual/perceptual remediation/compensation   PT Next Visit Plan administer 27mWT; Review HEP and progress balance and strengthening exercises.   PT Home Exercise Plan Seated DF with RTB resist; Bridging   Recommended Other Services OT evaluation completed after PT session today, not other services recommended at this time   Consulted and Agree with Plan of Care Patient      Patient will benefit from skilled therapeutic intervention in order to improve the following deficits and impairments:  Abnormal gait, Decreased activity tolerance, Decreased balance, Decreased endurance, Decreased knowledge of use of DME, Decreased range of motion, Decreased safety awareness, Decreased strength, Difficulty walking, Increased fascial restricitons, Impaired perceived functional ability, Impaired sensation, Impaired UE functional use, Impaired vision/preception, Improper body mechanics, Postural dysfunction  Visit Diagnosis: Muscle weakness (generalized) - Plan: PT plan of care cert/re-cert  Unsteadiness on feet - Plan: PT plan of care cert/re-cert  Difficulty in walking, not elsewhere classified - Plan: PT plan of care cert/re-cert       G-Codes - 2016-03-03 1554    Functional Assessment Tool Used Berg Balance Test, 75mWT, 5xSTS, Clinical Judgement, MMT   Functional Limitation Mobility: Walking and moving around   Mobility: Walking and Moving Around Current Status [id number]) At least 20 percent but less than 40 percent impaired, limited or restricted   Mobility: Walking and Moving Around Goal Status [phone]) At least 20 percent but less than 40 percent impaired, limited or restricted      Problem List Patient Active Problem List   Diagnosis Date Noted  . Cervical myelopathy with cervical radiculopathy 01/26/2016  . Lumbosacral radiculopathy  01/26/2016  . S/P cervical spinal fusion 01/26/2016     Collie Siad PT, DPT 2016-03-03, 5:47 PM  [place] MAIN [facility] SERVICES [address] [place], [place], [address] Phone: [phone]   Fax:  [phone]  Name: Anna Chambers MRN: [id number] Date of Birth: 14-Sep-1940

## 2016-02-09 ENCOUNTER — Ambulatory Visit: Payer: Medicare Other | Admitting: Physical Therapy

## 2016-02-09 ENCOUNTER — Ambulatory Visit: Payer: Medicare Other | Admitting: Occupational Therapy

## 2016-02-14 ENCOUNTER — Encounter: Payer: Self-pay | Admitting: Physical Therapy

## 2016-02-14 ENCOUNTER — Ambulatory Visit: Payer: Medicare Other | Admitting: Occupational Therapy

## 2016-02-14 ENCOUNTER — Ambulatory Visit: Payer: Medicare Other

## 2016-02-14 ENCOUNTER — Encounter: Payer: Self-pay | Admitting: Occupational Therapy

## 2016-02-14 VITALS — BP 125/74 | HR 58

## 2016-02-14 DIAGNOSIS — R202 Paresthesia of skin: Secondary | ICD-10-CM

## 2016-02-14 DIAGNOSIS — M6281 Muscle weakness (generalized): Secondary | ICD-10-CM | POA: Diagnosis not present

## 2016-02-14 DIAGNOSIS — R278 Other lack of coordination: Secondary | ICD-10-CM

## 2016-02-14 DIAGNOSIS — R200 Anesthesia of skin: Secondary | ICD-10-CM

## 2016-02-14 DIAGNOSIS — R2681 Unsteadiness on feet: Secondary | ICD-10-CM

## 2016-02-14 DIAGNOSIS — R29898 Other symptoms and signs involving the musculoskeletal system: Secondary | ICD-10-CM

## 2016-02-14 NOTE — Therapy (Signed)
[place] MAIN [facility] SERVICES [address] [place], [place], [address] Phone: [phone]   Fax:  [phone]  Physical Therapy Treatment  Patient Details  Name: Anna Chambers MRN: [id number] Date of Birth: 03-Dec-1940 Referring Provider: Alda Berthold  Encounter Date: 02/14/2016      PT End of Session - 02/14/16 1017    Visit Number 2   Number of Visits 17   Date for PT Re-Evaluation 2016-04-29   Authorization Type G codes   Authorization Time Period 2/10   PT Start Time 1005   PT Stop Time 1045   PT Time Calculation (min) 40 min   Equipment Utilized During Treatment Gait belt   Activity Tolerance Patient tolerated treatment well   Behavior During Therapy [facility] for tasks assessed/performed      Past Medical History:  Diagnosis Date  . Cervical myelopathy with cervical radiculopathy   . Depression   . Hypertension   . Sleep apnea     Past Surgical History:  Procedure Laterality Date  . EYE SURGERY    . FOOT SURGERY Left   . SPINE SURGERY     ACDF C3-4  . VAGINAL HYSTERECTOMY      Vitals:   02/14/16 1009  BP: 125/74  Pulse: (!) 58  SpO2: 100%        Subjective Assessment - 02/14/16 1008    Subjective Pt reports she is doing well on this date. She reports some L ankle and L great toe pain which is chronic for her. She is performing HEP without issue. No specific questions or concerns currently.    Pertinent History Pt presents today with LLE weakness and impaired sensation which she reports began back in 2014 following her ACDF C3-4.  She received PT at this clinic at the end of last year for ~6 weeks with some improvement.  Pt reports she feels week and has poor control of her LLE from her L knee down to her foot.  Pt not wearing AFO to PT evaluation but pt reports she wears it and uses SPC when ambulating longer distances.  Does not use AD or AFO at home.  Denies any falls in the past 6 months. Pt feels as though she drags her  L foot when ambulating.  Pt reports she enjoys walking at the park, can walk ~400 yards before needing to rest.  Still driving and independent with bathing, cooking.  Pt's goal is to strengthen her LLE and to decrease numbness and tingling in Bil hands (will work mainly with OT on UEs).   Pt with h/o Bil numbness/tingling from shoulder down to fingertips L>R).   Pt still with difficulty putting on earrings, opening cans, and buttoning her shirt (OT notified).  Has her husband assist with these tasks. 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    How long can you walk comfortably? ~400 yards   Diagnostic tests NCS/EMG   Patient Stated Goals to be able to walk without dragging L foot; to strengthen LLE   Currently in Pain? Yes   Pain Score 5    Pain Location Ankle   Pain Orientation Left   Pain Descriptors / Indicators Aching   Pain Type Chronic pain   Pain Onset More than a month ago  Pain Frequency Intermittent            [facility] PT Assessment - 02/14/16 1021      6 Minute Walk- Baseline   6 Minute Walk- Baseline yes   BP (mmHg) 125/74   HR (bpm) 58   02 Sat (%RA) 100 %   Modified Borg Scale for Dyspnea 0- Nothing at all   Perceived Rate of Exertion (Borg) 6-     6 Minute walk- Post Test   6 Minute Walk Post Test yes   BP (mmHg) 144/67   HR (bpm) 69   02 Sat (%RA) 99 %   Modified Borg Scale for Dyspnea 4- somewhat severe   Perceived Rate of Exertion (Borg) 10-     6 minute walk test results    Aerobic Endurance Distance Walked 985   Endurance additional comments Decreased L arm swing and decreased L ankle DF noted during gait        TREATMENT   Ther-ex Seated L DF with RTB resist 3 x 10, cues provided for correction; Bilateral bridges 3 x 10, pt does a good job  maintaining weight distribution between right and left LEs; Sit to stand without UE support 2 x 10;   Neuromuscular Re-education 6MWT performed with patient: 985' Heel toe/rocking x 10 (added to HEP); Semi-tandem progressing to tandem stance balance alternating LE forward 30s x 2 each; Airex balance with alternating toe taps to 6" step;  Pt issued additional written HEP for balance and strength to add to current program. Education provided to patient about how to perform at home.                        PT Education - 02/14/16 1016    Education provided Yes   Education Details HEP progression, form/technique correction   Person(s) Educated Patient   Methods Explanation;Demonstration;Verbal cues;Handout   Comprehension Verbalized understanding;Returned demonstration             PT Long Term Goals - 02/14/16 1022      PT LONG TERM GOAL #1   Title Pt will improve 5xSTS by at least 3 seconds to demonstrate improved BLE strenth and balance   Baseline 14.17 sec   Time 6   Period Weeks   Status New     PT LONG TERM GOAL #2   Title L DF and hip F strength will improve to 4/5 for decreased risk of falling   Baseline 3+/5 L hip F, 3/5 L DF   Time 8   Period Weeks   Status New     PT LONG TERM GOAL #3   Title Pt will improve Berg Balance Test score to at least 52/56 to demonstrate improved balance and decreased risk of falling   Baseline 45/56   Time 8   Period Weeks   Status New     PT LONG TERM GOAL #4   Title Pt will be able to ambulate in community without use of SPC to demonstrate improved balance and endurance.   Baseline SPC for community distances   Time 8   Period Weeks   Status New     PT LONG TERM GOAL #5   Title Pt will increase 6MWT by at least 83m (179ft) in order to demonstrate clinically significant improvement in cardiopulmonary endurance and community ambulation   Baseline 02/14/16   Time 8   Period Weeks   Status New  Plan - 02/14/16 1017    Clinical Impression Statement Pt demonstrates decreased endurance as demonstrated by 6MWT of 985' which is below age/gender norms. She is able to perform all balance exercises as instructed but struggles with full tandem stance balance. Pt issued HEP to work on balance and strength. She was encouraged to continue additional HEP and follow-up as scheduled.    Rehab Potential Fair   Clinical Impairments Affecting Rehab Potential (- and +) already received PT for same symptoms with some improvement.  (-) chronicity of symptoms since 2014.  (+) pt very positive and motivated.   PT Frequency 2x / week   PT Duration 8 weeks   PT Treatment/Interventions ADLs/Self Care Home Management;Aquatic Therapy;Biofeedback;Electrical Stimulation;DME Instruction;Gait training;Stair training;Functional mobility training;Therapeutic activities;Therapeutic exercise;Balance training;Neuromuscular re-education;Patient/family education;Orthotic Fit/Training;Manual techniques;Passive range of motion;Dry needling;Energy conservation;Taping;Visual/perceptual remediation/compensation   PT Next Visit Plan administer 50mWT; Review HEP and progress balance and strengthening exercises.   PT Home Exercise Plan Seated DF with RTB resist; Bridging, sit to stand without UE support, heel/toe rocking, semitandem/tandem balance   Consulted and Agree with Plan of Care Patient      Patient will benefit from skilled therapeutic intervention in order to improve the following deficits and impairments:  Abnormal gait, Decreased activity tolerance, Decreased balance, Decreased endurance, Decreased knowledge of use of DME, Decreased range of motion, Decreased safety awareness, Decreased strength, Difficulty walking, Increased fascial restricitons, Impaired perceived functional ability, Impaired sensation, Impaired UE functional use, Impaired vision/preception, Improper body mechanics, Postural  dysfunction  Visit Diagnosis: Muscle weakness (generalized)  Unsteadiness on feet     Problem List Patient Active Problem List   Diagnosis Date Noted  . Cervical myelopathy with cervical radiculopathy 01/26/2016  . Lumbosacral radiculopathy 01/26/2016  . S/P cervical spinal fusion 01/26/2016   Phillips Grout PT, DPT   Telly Jawad 02/14/2016, 12:06 PM  [place] MAIN [facility] SERVICES [address] [place], [place], [address] Phone: [phone]   Fax:  [phone]  Name: JADESOLA ROSINE MRN: [id number] Date of Birth: April 29, 1940

## 2016-02-14 NOTE — Patient Instructions (Addendum)
Weight Shift: Anterior / Posterior (Limits of Stability)    Slowly shift weight backward until toes begin to rise off floor. Return to starting position. Shift weight slowly forward until heels begin to rise off floor. Hold each position _2___ seconds. Repeat __10__ times per set, 2 sets/session. Do _2___ sessions per day.   Feet Heel-Toe "Tandem"    Arms outstretched, stand on straight line bringing one foot directly in front of the other. Hold for 30 seconds and then alternate. Perform 3 times with each leg forward. Do 2 sessions per day.   SIT TO STAND: Feet Apart    Place feet apart. Lean chest forward. Raise hips and straighten knees to stand. 10___ reps per set, _2__ sets per session, _2__ sessions per day;

## 2016-02-16 ENCOUNTER — Encounter: Payer: Self-pay | Admitting: Occupational Therapy

## 2016-02-16 ENCOUNTER — Ambulatory Visit: Payer: Medicare Other | Admitting: Occupational Therapy

## 2016-02-16 ENCOUNTER — Ambulatory Visit: Payer: Medicare Other

## 2016-02-16 DIAGNOSIS — M6281 Muscle weakness (generalized): Secondary | ICD-10-CM

## 2016-02-16 DIAGNOSIS — R278 Other lack of coordination: Secondary | ICD-10-CM

## 2016-02-16 DIAGNOSIS — R2681 Unsteadiness on feet: Secondary | ICD-10-CM

## 2016-02-16 NOTE — Therapy (Signed)
[place] MAIN [facility] SERVICES [address] [place], [place], [address] Phone: [phone]   Fax:  [phone]  Occupational Therapy Treatment  Patient Details  Name: Anna Chambers MRN: [id number] Date of Birth: 1940/05/20 Referring Provider: Dr. Benjamine Sprague  Encounter Date: 02/14/2016      OT End of Session - 02/16/16 1957    Visit Number 2   Number of Visits 24   Date for OT Re-Evaluation 05/01/16   Authorization Type Medicare G-code 2 of 10   OT Start Time 1045   OT Stop Time 1132   OT Time Calculation (min) 47 min   Activity Tolerance Patient tolerated treatment well   Behavior During Therapy [facility] for tasks assessed/performed      Past Medical History:  Diagnosis Date  . Cervical myelopathy with cervical radiculopathy   . Depression   . Hypertension   . Sleep apnea     Past Surgical History:  Procedure Laterality Date  . EYE SURGERY    . FOOT SURGERY Left   . SPINE SURGERY     ACDF C3-4  . VAGINAL HYSTERECTOMY      There were no vitals filed for this visit.      Subjective Assessment - 02/16/16 1956    Subjective  Patient reports both hands numb and tingling most of the time, notices it when she is washing dishes trying to pick up things.     Pertinent History Pt. presents with numbness and tingling in bilateral hands. Pt. had a cervical Fusion in 2014. Numbness has improved  after surgery, and following therapy in 2016. Pt. has had a worsening of numbness and tingling, and now presents for skilled OT services secondary to numbness interfering with ADL tasks.   Patient Stated Goals I want to try and avoid surgery   Currently in Pain? No/denies   Pain Score 0-No pain                      OT Treatments/Exercises (OP) - 02/16/16 1958      Fine Motor Coordination   Other Fine Motor Exercises Patient seen for fine motor coordination tasks this date with manipulation of [place], cues for flipping  items from one side to another, isolated finger movements on left, multiple repetitions completed.  Manipulation of 1/2 inch sized objects from tabletop, cues to use translatory movements of the hand on the left and using the hand for storage.       Neurological Re-education Exercises   Other Exercises 1 Patient seen this date for LUE ROM and strengthening tasks with resistive pinch pins, all levels picking up from tabletop and placing onto elevated plane of motion with use of yard stick in vertical position.   Additional reaching tasks in multiplanes of motion with left.                 OT Education - 02/16/16 1957    Education provided Yes   Education Details coordination and reaching activities   Person(s) Educated Patient   Methods Explanation;Demonstration;Verbal cues   Comprehension Verbal cues required;Verbalized understanding             OT Long Term Goals - 02/07/16 1739      OT LONG TERM GOAL #1   Title Pt. will improve left hand [facility] skills by 3 sec. to be able to handle change efficiently when shopping.   Baseline Right 25 sec., Left 36 sec. Pt. has difficulty  Time 12   Period Weeks   Status New     OT LONG TERM GOAL #2   Title Pt. will improve Left grip strength to be able to independently hold and pour from a pitcher.   Baseline Right: 60#, Left 45#   Time 12   Period Weeks   Status New     OT LONG TERM GOAL #3   Title Pt. will improve left pinch strength to be able to cut meat/veggies, and peel potatoes independently and efficiently.   Baseline Pt. has difficulty   Time 12   Period Weeks   Status New     OT LONG TERM GOAL #4   Title Pt. will independently and efficiently button shirts, and pants.   Baseline Pt. has difficulty   Time 12   Period Weeks   Status New     OT LONG TERM GOAL #5   Title Pt. will be able to independently apply earrings   Baseline Pt. has difficulty   Time 12   Period Weeks   Status New                Plan - 02/16/16 2015    Clinical Impression Statement Patient with numbness and tingling in her left hand at the beginning of the session however, after completing tasks she denied any of these symptoms. Patient working towards improving left UE strength, ROM and coordination tasks to improve functional use for necessary daily tasks at home.    Rehab Potential Good   Clinical Impairments Affecting Rehab Potential Positive indicators: motivation, family support, age. Negative Indicators: multiple comorbidities.   OT Frequency 2x / week   OT Duration 12 weeks   OT Treatment/Interventions Self-care/ADL training;Therapeutic exercise;Patient/family education;Therapeutic exercises;Energy conservation   Consulted and Agree with Plan of Care Patient      Patient will benefit from skilled therapeutic intervention in order to improve the following deficits and impairments:  Decreased strength, Impaired UE functional use, Decreased range of motion, Decreased knowledge of use of DME  Visit Diagnosis: Muscle weakness (generalized)  Other lack of coordination  Weakness of left hand  Numbness and tingling in right hand  Numbness and tingling in left hand    Problem List Patient Active Problem List   Diagnosis Date Noted  . Cervical myelopathy with cervical radiculopathy 01/26/2016  . Lumbosacral radiculopathy 01/26/2016  . S/P cervical spinal fusion 01/26/2016   Yalitza Teed T Lesslie Mckeehan, OTR/L, CLT  Jovanna Hodges 02/16/2016, 8:15 PM  [place] MAIN [facility] SERVICES [address] [place], [place], [address] Phone: [phone]   Fax:  [phone]  Name: PROMISE CREA MRN: [id number] Date of Birth: 1940-10-20

## 2016-02-16 NOTE — Therapy (Signed)
[place] MAIN [facility] SERVICES [address] [place], [place], [address] Phone: [phone]   Fax:  [phone]  Physical Therapy Treatment  Patient Details  Name: Anna Chambers MRN: [id number] Date of Birth: Feb 08, 1940 Referring Provider: Alda Berthold  Encounter Date: 02/16/2016      PT End of Session - 02/16/16 1438    Visit Number 3   Number of Visits 17   Date for PT Re-Evaluation 23-Apr-2016   Authorization Type G codes   Authorization Time Period 3 /10   PT Start Time 1405   PT Stop Time 1445   PT Time Calculation (min) 40 min   Equipment Utilized During Treatment Gait belt   Activity Tolerance Patient tolerated treatment well   Behavior During Therapy [facility] for tasks assessed/performed      Past Medical History:  Diagnosis Date  . Cervical myelopathy with cervical radiculopathy   . Depression   . Hypertension   . Sleep apnea     Past Surgical History:  Procedure Laterality Date  . EYE SURGERY    . FOOT SURGERY Left   . SPINE SURGERY     ACDF C3-4  . VAGINAL HYSTERECTOMY      There were no vitals filed for this visit.      Subjective Assessment - 02/16/16 1411    Subjective Patient reports she has difficulty walking fast, lifting up her left foot, and general balancing.    Pertinent History Pt presents today with LLE weakness and impaired sensation which she reports began back in 2014 following her ACDF C3-4.  She received PT at this clinic at the end of last year for ~6 weeks with some improvement.  Pt reports she feels week and has poor control of her LLE from her L knee down to her foot.  Pt not wearing AFO to PT evaluation but pt reports she wears it and uses SPC when ambulating longer distances.  Does not use AD or AFO at home.  Denies any falls in the past 6 months. Pt feels as though she drags her L foot when ambulating.  Pt reports she enjoys walking at the park, can walk ~400 yards before needing to rest.  Still  driving and independent with bathing, cooking.  Pt's goal is to strengthen her LLE and to decrease numbness and tingling in Bil hands (will work mainly with OT on UEs).   Pt with h/o Bil numbness/tingling from shoulder down to fingertips L>R).   Pt still with difficulty putting on earrings, opening cans, and buttoning her shirt (OT notified).  Has her husband assist with these tasks. 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    How long can you walk comfortably? ~400 yards   Diagnostic tests NCS/EMG   Patient Stated Goals to be able to walk without dragging L foot; to strengthen LLE   Currently in Pain? No/denies   Pain Onset More than a month ago      TREATMENT  Ther-ex Seated L DF with RTB resist 2 x 15, cues provided for correction; Sit to stand without UE support 2 x 10; Inversion/eversion in sitting with towel under foot - 2 x 15 Seated ball squeeze glute squeeze - 2 x 15 4# ball    Neuromuscular Re-education Airex  side stepping on beam - x3 laps down/back Stepping over half foam with cueing on clearing feet B - x 20 B Forward step ups without UE support onto 6" step - x 20 B Semi-tandem progressing to tandem stance balance alternating LE forward on Airex --  30s x 2 each; Airex balance with alternating toe taps to 6" step - 2 x 20 B      PT Education - 02/16/16 1424    Education provided Yes   Education Details Form/technique throughout session   Person(s) Educated Patient   Methods Explanation;Demonstration   Comprehension Verbalized understanding;Returned demonstration             PT Long Term Goals - 02/14/16 1022      PT LONG TERM GOAL #1   Title Pt will improve 5xSTS by at least 3 seconds to demonstrate improved BLE strenth and balance   Baseline 14.17 sec    Time 6   Period Weeks   Status New     PT LONG TERM GOAL #2   Title L DF and hip F strength will improve to 4/5 for decreased risk of falling   Baseline 3+/5 L hip F, 3/5 L DF   Time 8   Period Weeks   Status New     PT LONG TERM GOAL #3   Title Pt will improve Berg Balance Test score to at least 52/56 to demonstrate improved balance and decreased risk of falling   Baseline 45/56   Time 8   Period Weeks   Status New     PT LONG TERM GOAL #4   Title Pt will be able to ambulate in community without use of SPC to demonstrate improved balance and endurance.   Baseline SPC for community distances   Time 8   Period Weeks   Status New     PT LONG TERM GOAL #5   Title Pt will increase 6MWT by at least 30m (174ft) in order to demonstrate clinically significant improvement in cardiopulmonary endurance and community ambulation   Baseline 02/14/16   Time 8   Period Weeks   Status New               Plan - 02/16/16 1440    Clinical Impression Statement Pt demonstrates increased postural sway when performing standing activities today indicating decreased static and dynamic balance. Patient demonstrates increased weight bearing on the tapping foot when performing toe taps indicating decreased static and dynamic balance. Patient will benefit from further balance training to return to prior level of function.    Rehab Potential Fair   Clinical Impairments Affecting Rehab Potential (- and +) already received PT for same symptoms with some improvement.  (-) chronicity of symptoms since 2014.  (+) pt very positive and motivated.   PT Frequency 2x / week   PT Duration 8 weeks   PT Treatment/Interventions ADLs/Self Care Home Management;Aquatic Therapy;Biofeedback;Electrical Stimulation;DME Instruction;Gait training;Stair training;Functional mobility training;Therapeutic activities;Therapeutic exercise;Balance training;Neuromuscular re-education;Patient/family education;Orthotic  Fit/Training;Manual techniques;Passive range of motion;Dry needling;Energy conservation;Taping;Visual/perceptual remediation/compensation   PT Next Visit Plan administer 37mWT; Review HEP and progress balance and strengthening exercises.   PT Home Exercise Plan Seated DF with RTB resist; Bridging, sit to stand without UE support, heel/toe rocking, semitandem/tandem balance   Consulted and Agree with Plan of Care Patient      Patient will benefit from skilled therapeutic intervention in order to improve the following deficits and impairments:  Abnormal gait, Decreased activity tolerance, Decreased balance, Decreased endurance, Decreased  knowledge of use of DME, Decreased range of motion, Decreased safety awareness, Decreased strength, Difficulty walking, Increased fascial restricitons, Impaired perceived functional ability, Impaired sensation, Impaired UE functional use, Impaired vision/preception, Improper body mechanics, Postural dysfunction  Visit Diagnosis: Muscle weakness (generalized)  Other lack of coordination  Unsteadiness on feet     Problem List Patient Active Problem List   Diagnosis Date Noted  . Cervical myelopathy with cervical radiculopathy 01/26/2016  . Lumbosacral radiculopathy 01/26/2016  . S/P cervical spinal fusion 01/26/2016    Blythe Stanford, PT DPT 02/16/2016, 2:51 PM  [place] MAIN [facility] SERVICES [address] [place], [place], [address] Phone: [phone]   Fax:  [phone]  Name: Anna Chambers MRN: [id number] Date of Birth: 1940-08-05

## 2016-02-17 ENCOUNTER — Encounter: Payer: Self-pay | Admitting: Occupational Therapy

## 2016-02-17 NOTE — Therapy (Signed)
[place] MAIN [facility] SERVICES [address] [place], [place], [address] Phone: [phone]   Fax:  [phone]  Occupational Therapy Treatment  Patient Details  Name: Anna Chambers MRN: [id number] Date of Birth: 12/05/1940 Referring Provider: Dr. Benjamine Sprague  Encounter Date: 02/16/2016      OT End of Session - 02/17/16 1614    Visit Number 3   Number of Visits 24   Date for OT Re-Evaluation 05/01/16   Authorization Type Medicare G-code 3 of 10   OT Start Time 1300   OT Stop Time 1345   OT Time Calculation (min) 45 min   Activity Tolerance Patient tolerated treatment well   Behavior During Therapy [facility] for tasks assessed/performed      Past Medical History:  Diagnosis Date  . Cervical myelopathy with cervical radiculopathy   . Depression   . Hypertension   . Sleep apnea     Past Surgical History:  Procedure Laterality Date  . EYE SURGERY    . FOOT SURGERY Left   . SPINE SURGERY     ACDF C3-4  . VAGINAL HYSTERECTOMY      There were no vitals filed for this visit.      Subjective Assessment - 02/17/16 1610    Subjective  Patient reports she is doing well, is trying to use her hands as much as possible, has some increased numbness this date compared to last session.  States she drops items often.Trying to squeeze water out of washcloth at home.   Pertinent History Pt. presents with numbness and tingling in bilateral hands. Pt. had a cervical Fusion in 2014. Numbness has improved  after surgery, and following therapy in 2016. Pt. has had a worsening of numbness and tingling, and now presents for skilled OT services secondary to numbness interfering with ADL tasks.   Patient Stated Goals I want to try and avoid surgery   Currently in Pain? No/denies   Pain Score 0-No pain                      OT Treatments/Exercises (OP) - 02/17/16 1611      Fine Motor Coordination   Other Fine Motor Exercises Patient seen this  date for Fine motor coordination with use of Washer and dowel sticks with multidirectional reaching for LUE, cues provided and assistance to pick up dropped items.     Neurological Re-education Exercises   Other Exercises 1 Green putty for gross grip and lateral, 3 point and 2 point pinch skills left hand with cues for technique.                OT Education - 02/17/16 1614    Education provided Yes   Education Details technique for sustained grip task   Person(s) Educated Patient   Methods Explanation;Demonstration;Verbal cues   Comprehension Verbal cues required;Returned demonstration;Verbalized understanding             OT Long Term Goals - 02/07/16 1739      OT LONG TERM GOAL #1   Title Pt. will improve left hand [facility] skills by 3 sec. to be able to handle change efficiently when shopping.   Baseline Right 25 sec., Left 36 sec. Pt. has difficulty   Time 12   Period Weeks   Status New     OT LONG TERM GOAL #2   Title Pt. will improve Left grip strength to be able to independently hold and pour from a  pitcher.   Baseline Right: 60#, Left 45#   Time 12   Period Weeks   Status New     OT LONG TERM GOAL #3   Title Pt. will improve left pinch strength to be able to cut meat/veggies, and peel potatoes independently and efficiently.   Baseline Pt. has difficulty   Time 12   Period Weeks   Status New     OT LONG TERM GOAL #4   Title Pt. will independently and efficiently button shirts, and pants.   Baseline Pt. has difficulty   Time 12   Period Weeks   Status New     OT LONG TERM GOAL #5   Title Pt. will be able to independently apply earrings   Baseline Pt. has difficulty   Time 12   Period Weeks   Status New               Plan - 02/17/16 1614    Clinical Impression Statement Patient continues to progress well with tasks, she still has some numbness which results in dropping items frequently.  Cues for technique with exercises this date and made  recommendations regarding some fine motor coordination activities for home.  Continue to work towards goals to improve hand skills to perform necessary ADL and IADL tasks.    Rehab Potential Good   Clinical Impairments Affecting Rehab Potential Positive indicators: motivation, family support, age. Negative Indicators: multiple comorbidities.   OT Frequency 2x / week   OT Duration 12 weeks   OT Treatment/Interventions Self-care/ADL training;Therapeutic exercise;Patient/family education;Therapeutic exercises;Energy conservation   Consulted and Agree with Plan of Care Patient      Patient will benefit from skilled therapeutic intervention in order to improve the following deficits and impairments:  Decreased strength, Impaired UE functional use, Decreased range of motion, Decreased knowledge of use of DME  Visit Diagnosis: Muscle weakness (generalized)  Other lack of coordination    Problem List Patient Active Problem List   Diagnosis Date Noted  . Cervical myelopathy with cervical radiculopathy 01/26/2016  . Lumbosacral radiculopathy 01/26/2016  . S/P cervical spinal fusion 01/26/2016   Anna Chambers T Braheem Tomasik, OTR/L, CLT  Anna Chambers 02/17/2016, 4:28 PM  [place] MAIN [facility] SERVICES [address] [place], [place], [address] Phone: [phone]   Fax:  [phone]  Name: Anna Chambers MRN: [id number] Date of Birth: November 26, 1940

## 2016-02-20 ENCOUNTER — Ambulatory Visit: Payer: Medicare Other

## 2016-02-20 ENCOUNTER — Ambulatory Visit: Payer: Medicare Other | Admitting: Occupational Therapy

## 2016-02-20 DIAGNOSIS — M6281 Muscle weakness (generalized): Secondary | ICD-10-CM

## 2016-02-20 DIAGNOSIS — R2681 Unsteadiness on feet: Secondary | ICD-10-CM

## 2016-02-20 DIAGNOSIS — R278 Other lack of coordination: Secondary | ICD-10-CM

## 2016-02-20 NOTE — Therapy (Signed)
[place] MAIN [facility] SERVICES [address] [place], [place], [address] Phone: [phone]   Fax:  [phone]  Physical Therapy Treatment  Patient Details  Name: Anna Chambers MRN: [id number] Date of Birth: [birth year] Referring Provider: Alda Berthold  Encounter Date: 02/20/2016      PT End of Session - 02/20/16 [id number]    Visit Number 4   Number of Visits 17   Date for PT Re-Evaluation 06-Apr-2016   Authorization Type G codes   Authorization Time Period 4 /10   PT Start Time 1515   PT Stop Time 1600   PT Time Calculation (min) 45 min   Equipment Utilized During Treatment Gait belt   Activity Tolerance Patient tolerated treatment well   Behavior During Therapy [facility] for tasks assessed/performed      Past Medical History:  Diagnosis Date  . Cervical myelopathy with cervical radiculopathy   . Depression   . Hypertension   . Sleep apnea     Past Surgical History:  Procedure Laterality Date  . EYE SURGERY    . FOOT SURGERY Left   . SPINE SURGERY     ACDF C3-4  . VAGINAL HYSTERECTOMY      There were no vitals filed for this visit.      Subjective Assessment - 02/20/16 1523    Subjective Patient reports she's doing "alright" and denies any falls since the previous visit.    Pertinent History Pt presents today with LLE weakness and impaired sensation which she reports began back in 2014 following her ACDF C3-4.  She received PT at this clinic at the end of last year for ~6 weeks with some improvement.  Pt reports she feels week and has poor control of her LLE from her L knee down to her foot.  Pt not wearing AFO to PT evaluation but pt reports she wears it and uses SPC when ambulating longer distances.  Does not use AD or AFO at home.  Denies any falls in the past 6 months. Pt feels as though she drags her L foot when ambulating.  Pt reports she enjoys walking at the park, can walk ~400 yards before needing to rest.  Still driving and  independent with bathing, cooking.  Pt's goal is to strengthen her LLE and to decrease numbness and tingling in Bil hands (will work mainly with OT on UEs).   Pt with h/o Bil numbness/tingling from shoulder down to fingertips L>R).   Pt still with difficulty putting on earrings, opening cans, and buttoning her shirt (OT notified).  Has her husband assist with these tasks. 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    How long can you walk comfortably? ~400 yards   Diagnostic tests NCS/EMG   Patient Stated Goals to be able to walk without dragging L foot; to strengthen LLE   Pain Onset More than a month ago          TREATMENT  Ther-ex Seated L DF with RTB resist 2 x 20, cues provided for correction; Leg Press with focus on quadriceps activation - 2 x 20; calf raises - 2 x 25 Seated ball squeeze glute squeeze - 2 x 25 4# ball    Neuromuscular Re-education Airex side stepping on beam - x4 laps  down/back Tandem ambulation on airex beams - x3 lap down/back Stepping over half foam with cueing on clearing feet B onto blue airex  - x 15 B Semi-tandem progressing to tandem stance balance alternating LE forward on Airex --  30s x 2 each Airex balance with alternating toe taps to cones  - x15          PT Education - 02/20/16 1530    Education provided Yes   Education Details Reviewed HEP   Person(s) Educated Patient   Methods Explanation;Demonstration   Comprehension Verbalized understanding;Returned demonstration             PT Long Term Goals - 02/14/16 1022      PT LONG TERM GOAL #1   Title Pt will improve 5xSTS by at least 3 seconds to demonstrate improved BLE strenth and balance   Baseline 14.17 sec   Time 6   Period Weeks   Status New     PT LONG TERM GOAL #2    Title L DF and hip F strength will improve to 4/5 for decreased risk of falling   Baseline 3+/5 L hip F, 3/5 L DF   Time 8   Period Weeks   Status New     PT LONG TERM GOAL #3   Title Pt will improve Berg Balance Test score to at least 52/56 to demonstrate improved balance and decreased risk of falling   Baseline 45/56   Time 8   Period Weeks   Status New     PT LONG TERM GOAL #4   Title Pt will be able to ambulate in community without use of SPC to demonstrate improved balance and endurance.   Baseline SPC for community distances   Time 8   Period Weeks   Status New     PT LONG TERM GOAL #5   Title Pt will increase 6MWT by at least 35m (137ft) in order to demonstrate clinically significant improvement in cardiopulmonary endurance and community ambulation   Baseline 02/14/16   Time 8   Period Weeks   Status New               Plan - 02/20/16 1557    Clinical Impression Statement Patient continues to demonstrate increased postural sway with exercises but requires less UE support to perform exercises indicating improvement in balance and patient will benefit from further skilled therapy to return to prior level of function.    Rehab Potential Fair   Clinical Impairments Affecting Rehab Potential (- and +) already received PT for same symptoms with some improvement.  (-) chronicity of symptoms since 2014.  (+) pt very positive and motivated.   PT Frequency 2x / week   PT Duration 8 weeks   PT Treatment/Interventions ADLs/Self Care Home Management;Aquatic Therapy;Biofeedback;Electrical Stimulation;DME Instruction;Gait training;Stair training;Functional mobility training;Therapeutic activities;Therapeutic exercise;Balance training;Neuromuscular re-education;Patient/family education;Orthotic Fit/Training;Manual techniques;Passive range of motion;Dry needling;Energy conservation;Taping;Visual/perceptual remediation/compensation   PT Next Visit Plan administer 37mWT; Review HEP and  progress balance and strengthening exercises.   PT Home Exercise Plan Seated DF with RTB resist; Bridging, sit to stand without UE support, heel/toe rocking, semitandem/tandem balance   Consulted and Agree with Plan of Care Patient      Patient will benefit from skilled therapeutic intervention in order to improve the following deficits and impairments:  Abnormal gait, Decreased activity tolerance, Decreased balance, Decreased endurance, Decreased knowledge of use of DME, Decreased range of motion, Decreased safety awareness, Decreased strength, Difficulty walking, Increased fascial restricitons, Impaired  perceived functional ability, Impaired sensation, Impaired UE functional use, Impaired vision/preception, Improper body mechanics, Postural dysfunction  Visit Diagnosis: Muscle weakness (generalized)  Other lack of coordination  Unsteadiness on feet     Problem List Patient Active Problem List   Diagnosis Date Noted  . Cervical myelopathy with cervical radiculopathy 01/26/2016  . Lumbosacral radiculopathy 01/26/2016  . S/P cervical spinal fusion 01/26/2016    Blythe Stanford, PT DPT 02/20/2016, 4:02 PM  [place] MAIN [facility] SERVICES [address] [place], [place], [address] Phone: [phone]   Fax:  [phone]  Name: DALEAH DIOGUARDI MRN: [id number] Date of Birth: 03/26/1940

## 2016-02-21 ENCOUNTER — Encounter: Payer: Self-pay | Admitting: Occupational Therapy

## 2016-02-21 NOTE — Therapy (Signed)
[place] MAIN [facility] SERVICES [address] [place], [place], [address] Phone: [phone]   Fax:  [phone]  Occupational Therapy Treatment  Patient Details  Name: Anna Chambers MRN: [id number] Date of Birth: 03-01-1940 Referring Provider: Dr. Benjamine Sprague  Encounter Date: 02/20/2016      OT End of Session - 02/20/16 1610    Visit Number 4   Number of Visits 24   Date for OT Re-Evaluation 05/01/16   Authorization Type Medicare G-code 4 of 10   Authorization - Number of Visits 24   OT Start Time 1430   OT Stop Time 1515   OT Time Calculation (min) 45 min   Activity Tolerance Patient tolerated treatment well   Behavior During Therapy [facility] for tasks assessed/performed      Past Medical History:  Diagnosis Date  . Cervical myelopathy with cervical radiculopathy   . Depression   . Hypertension   . Sleep apnea     Past Surgical History:  Procedure Laterality Date  . EYE SURGERY    . FOOT SURGERY Left   . SPINE SURGERY     ACDF C3-4  . VAGINAL HYSTERECTOMY      There were no vitals filed for this visit.      Subjective Assessment - 02/20/16 1439    Subjective  Pt. reports trying to use her fingers, and hands more at home.   Pertinent History Pt. presents with numbness and tingling in bilateral hands. Pt. had a cervical Fusion in 2014. Numbness has improved  after surgery, and following therapy in 2016. Pt. has had a worsening of numbness and tingling, and now presents for skilled OT services secondary to numbness interfering with ADL tasks.   Patient Stated Goals I want to try and avoid surgery   Currently in Pain? No/denies   Pain Score 0-No pain        OT TREATMENT    Neuro muscular re-education:  Pt. Worked grasping 2" sticks and placing them in a pegboard. Pt. Worked on removing the sticks with resistive tweezers to encourage wrist extension while maintaining a lateral pinch grasp when removing them. Pt. performed  [facility] skills training to improve speed and dexterity needed for ADL tasks and writing. Pt. demonstrated grasping 1 inch sticks,  inch cylindrical collars, and  inch flat washers on the Purdue pegboard. Pt. performed grasping each item with her 2nd digit and thumb, and storing them in the palm. Pt. presented with difficulty storing  inch objects at a time in the palmar aspect of the hand.Pt. Had difficulty with translatory movements of the hand. Pt. Worked on alternating right and left hand coordination, speed, and dexterity.  Therapeutic Exercise:  Pt. Worked on pinch strengthening in the left hand for lateral, and 3pt. pinch using yellow, red, green, and blue resistive clips. Pt. worked on placing the clips at various vertical and horizontal angles. Tactile and verbal cues were required for eliciting the desired movement. Pt. Required cues for hand and digit position.                           OT Education - 02/20/16 1453    Education provided Yes   Education Details [facility] skills, hand function   Person(s) Educated Patient   Methods Explanation;Demonstration;Verbal cues   Comprehension Verbalized understanding;Returned demonstration;Verbal cues required             OT Long Term Goals - 02/07/16 1739  OT LONG TERM GOAL #1   Title Pt. will improve left hand [facility] skills by 3 sec. to be able to handle change efficiently when shopping.   Baseline Right 25 sec., Left 36 sec. Pt. has difficulty   Time 12   Period Weeks   Status New     OT LONG TERM GOAL #2   Title Pt. will improve Left grip strength to be able to independently hold and pour from a pitcher.   Baseline Right: 60#, Left 45#   Time 12   Period Weeks   Status New     OT LONG TERM GOAL #3   Title Pt. will improve left pinch strength to be able to cut meat/veggies, and peel potatoes independently and efficiently.   Baseline Pt. has difficulty   Time 12   Period Weeks   Status New     OT LONG  TERM GOAL #4   Title Pt. will independently and efficiently button shirts, and pants.   Baseline Pt. has difficulty   Time 12   Period Weeks   Status New     OT LONG TERM GOAL #5   Title Pt. will be able to independently apply earrings   Baseline Pt. has difficulty   Time 12   Period Weeks   Status New               Plan - 02/20/16 1504    Clinical Impression Statement Pt. reports feeling like she is improving, and is trying to use her hands more during daily tasks. Pt. continues to make progress with grip and pinch strengthening. Pt. continues to work on improving [facility] skills and translatory movements of the hand. Pt. continues to benefit from skilled OT services for bilateral hand function for use dirong ADLs, and IADLs.    Rehab Potential Good   Clinical Impairments Affecting Rehab Potential Positive indicators: motivation, family support, age. Negative Indicators: multiple comorbidities.   OT Frequency 2x / week   OT Duration 12 weeks   OT Treatment/Interventions Self-care/ADL training;Therapeutic exercise;Patient/family education;Therapeutic exercises;Energy conservation   Consulted and Agree with Plan of Care Patient      Patient will benefit from skilled therapeutic intervention in order to improve the following deficits and impairments:  Decreased strength, Impaired UE functional use, Decreased range of motion, Decreased knowledge of use of DME  Visit Diagnosis: Muscle weakness (generalized)  Other lack of coordination    Problem List Patient Active Problem List   Diagnosis Date Noted  . Cervical myelopathy with cervical radiculopathy 01/26/2016  . Lumbosacral radiculopathy 01/26/2016  . S/P cervical spinal fusion 01/26/2016    [name], MS, OTR/L 02/21/2016, 8:55 AM  [place] MAIN [facility] SERVICES [address] [place], [place], [address] Phone: [phone]   Fax:  [phone]  Name: Anna Chambers MRN:  [id number] Date of Birth: [birth year]

## 2016-02-21 NOTE — Patient Instructions (Signed)
OT TREATMENT    Neuro muscular re-education:  Pt. Worked grasping 2" sticks and placing them in a pegboard. Pt. Worked on removing the sticks with resistive tweezers to encourage wrist extension while maintaining a lateral pinch grasp when removing them. Pt. performed [facility] skills training to improve speed and dexterity needed for ADL tasks and writing. Pt. demonstrated grasping 1 inch sticks,  inch cylindrical collars, and  inch flat washers on the Purdue pegboard. Pt. performed grasping each item with her 2nd digit and thumb, and storing them in the palm. Pt. presented with difficulty storing  inch objects at a time in the palmar aspect of the hand.Pt. Had difficulty with translatory movements of the hand. Pt. Worked on alternating right and left hand coordination, speed, and dexterity.  Therapeutic Exercise:  Pt. Worked on pinch strengthening in the left hand for lateral, and 3pt. pinch using yellow, red, green, and blue resistive clips. Pt. worked on placing the clips at various vertical and horizontal angles. Tactile and verbal cues were required for eliciting the desired movement. Pt. Required cues for hand and digit position.

## 2016-02-23 ENCOUNTER — Ambulatory Visit: Payer: Medicare Other

## 2016-02-27 ENCOUNTER — Ambulatory Visit: Payer: Medicare Other | Admitting: Occupational Therapy

## 2016-02-27 ENCOUNTER — Ambulatory Visit: Payer: Medicare Other

## 2016-02-27 ENCOUNTER — Encounter: Payer: Self-pay | Admitting: Occupational Therapy

## 2016-02-27 DIAGNOSIS — M6281 Muscle weakness (generalized): Secondary | ICD-10-CM

## 2016-02-27 DIAGNOSIS — R278 Other lack of coordination: Secondary | ICD-10-CM

## 2016-02-27 DIAGNOSIS — R2681 Unsteadiness on feet: Secondary | ICD-10-CM

## 2016-02-27 NOTE — Therapy (Signed)
[place] MAIN [facility] SERVICES [address] [place], [place], [address] Phone: [phone]   Fax:  [phone]  Physical Therapy Treatment  Patient Details  Name: Anna Chambers MRN: [id number] Date of Birth: Sep 10, 1940 Referring Provider: Alda Berthold  Encounter Date: 02/27/2016      PT End of Session - 02/27/16 [id number]    Visit Number 5   Number of Visits 17   Date for PT Re-Evaluation 05-02-16   Authorization Type G codes   Authorization Time Period 5/10 G code   PT Start Time 1515   PT Stop Time 1600   PT Time Calculation (min) 45 min   Equipment Utilized During Treatment Gait belt   Activity Tolerance Patient tolerated treatment well   Behavior During Therapy [facility] for tasks assessed/performed      Past Medical History:  Diagnosis Date  . Cervical myelopathy with cervical radiculopathy   . Depression   . Hypertension   . Sleep apnea     Past Surgical History:  Procedure Laterality Date  . EYE SURGERY    . FOOT SURGERY Left   . SPINE SURGERY     ACDF C3-4  . VAGINAL HYSTERECTOMY      There were no vitals filed for this visit.      Subjective Assessment - 02/27/16 1550    Subjective Patient reports no complaints at this time and states she was sore for around 1 day after the previous treatment session.    Pertinent History Pt presents today with LLE weakness and impaired sensation which she reports began back in 2014 following her ACDF C3-4.  She received PT at this clinic at the end of last year for ~6 weeks with some improvement.  Pt reports she feels week and has poor control of her LLE from her L knee down to her foot.  Pt not wearing AFO to PT evaluation but pt reports she wears it and uses SPC when ambulating longer distances.  Does not use AD or AFO at home.  Denies any falls in the past 6 months. Pt feels as though she drags her L foot when ambulating.  Pt reports she enjoys walking at the park, can walk ~400 yards  before needing to rest.  Still driving and independent with bathing, cooking.  Pt's goal is to strengthen her LLE and to decrease numbness and tingling in Bil hands (will work mainly with OT on UEs).   Pt with h/o Bil numbness/tingling from shoulder down to fingertips L>R).   Pt still with difficulty putting on earrings, opening cans, and buttoning her shirt (OT notified).  Has her husband assist with these tasks. 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    How long can you walk comfortably? ~400 yards   Diagnostic tests NCS/EMG   Patient Stated Goals to be able to walk without dragging L foot; to strengthen LLE   Currently in Pain? No/denies   Pain Onset More than a month ago      TREATMENT  Ther-ex Nustep Level 2 seat position 7 - 5 min Heel raises on airex pad with UE support - x20 Standing hip abduction on airex pad - x 20 B Leg Press with focus on quadriceps activation - 2 x 20;  120#  Neuromuscular Re-education Side stepping up and over airex pad on 4" step - x 10 B Step ups onto airex pad positioned on 4" step -- x 10 B Stepping over double half foam with cueing on clearing feet B onto blue airex  - x 15 B Tandem ambulation forward/backward  - 2 x 15ft Airex balance with alternating toe taps to cones  - x15 B Tandem stance balance alternating LE forward on Airex -- 30s x 2 B      PT Education - 02/27/16 1552    Education provided Yes   Education Details Form/technique with exercise   Person(s) Educated Patient   Methods Explanation;Demonstration   Comprehension Verbalized understanding;Returned demonstration             PT Long Term Goals - 02/14/16 1022      PT LONG TERM GOAL #1   Title Pt will improve 5xSTS by at least 3 seconds to demonstrate improved BLE  strenth and balance   Baseline 14.17 sec   Time 6   Period Weeks   Status New     PT LONG TERM GOAL #2   Title L DF and hip F strength will improve to 4/5 for decreased risk of falling   Baseline 3+/5 L hip F, 3/5 L DF   Time 8   Period Weeks   Status New     PT LONG TERM GOAL #3   Title Pt will improve Berg Balance Test score to at least 52/56 to demonstrate improved balance and decreased risk of falling   Baseline 45/56   Time 8   Period Weeks   Status New     PT LONG TERM GOAL #4   Title Pt will be able to ambulate in community without use of SPC to demonstrate improved balance and endurance.   Baseline SPC for community distances   Time 8   Period Weeks   Status New     PT LONG TERM GOAL #5   Title Pt will increase 6MWT by at least 47m (133ft) in order to demonstrate clinically significant improvement in cardiopulmonary endurance and community ambulation   Baseline 02/14/16   Time 8   Period Weeks   Status New               Plan - 02/27/16 1556    Clinical Impression Statement Patient demonstrates improved balance with exercises with decreased postural sway and decreased use of UE to assist with maintaining balance. Although patient is improving she continues to demonstrate decreased balance/strength and will benefit from further skilled therapy to return  to prior level of function.    Rehab Potential Fair   Clinical Impairments Affecting Rehab Potential (- and +) already received PT for same symptoms with some improvement.  (-) chronicity of symptoms since 2014.  (+) pt very positive and motivated.   PT Frequency 2x / week   PT Duration 8 weeks   PT Treatment/Interventions ADLs/Self Care Home Management;Aquatic Therapy;Biofeedback;Electrical Stimulation;DME Instruction;Gait training;Stair training;Functional mobility training;Therapeutic activities;Therapeutic exercise;Balance training;Neuromuscular re-education;Patient/family education;Orthotic  Fit/Training;Manual techniques;Passive range of motion;Dry needling;Energy conservation;Taping;Visual/perceptual remediation/compensation   PT Next Visit Plan administer 39mWT; Review HEP and progress balance and strengthening exercises.   PT Home Exercise Plan Seated DF with RTB resist; Bridging, sit to stand without UE support, heel/toe rocking, semitandem/tandem balance   Consulted and Agree with Plan of Care Patient      Patient will benefit from skilled therapeutic intervention in order to improve the following deficits and impairments:  Abnormal gait, Decreased activity tolerance, Decreased balance, Decreased endurance, Decreased knowledge of use of DME, Decreased range of motion, Decreased safety awareness, Decreased strength, Difficulty walking, Increased fascial restricitons, Impaired perceived functional ability, Impaired sensation, Impaired UE functional use, Impaired vision/preception, Improper body mechanics, Postural dysfunction  Visit Diagnosis: Muscle weakness (generalized)  Other lack of coordination  Unsteadiness on feet     Problem List Patient Active Problem List   Diagnosis Date Noted  . Cervical myelopathy with cervical radiculopathy 01/26/2016  . Lumbosacral radiculopathy 01/26/2016  . S/P cervical spinal fusion 01/26/2016    Blythe Stanford, PT DPT 02/27/2016, 4:02 PM  [place] MAIN [facility] SERVICES [address] [place], [place], [address] Phone: [phone]   Fax:  [phone]  Name: Anna Chambers MRN: [id number] Date of Birth: 25-Feb-1940

## 2016-02-28 NOTE — Therapy (Signed)
Anna Chambers [address] [place], [place], [address] Phone: [phone]   Fax:  [phone]  Occupational Therapy Treatment  Patient Details  Name: Anna Chambers MRN: [id number] Date of Birth: 1940/04/04 Referring Provider: Dr. Benjamine Sprague  Encounter Date: 02/27/2016      OT End of Session - 02/28/16 1822    Visit Number 5   Number of Visits 24   Date for OT Re-Evaluation 05/01/16   Authorization Type Medicare G-code 5 of 10   Authorization - Number of Visits 24   OT Start Time [id number]   OT Stop Time 1500   OT Time Calculation (min) 46 min   Activity Tolerance Patient tolerated treatment well   Behavior During Therapy [facility] for tasks assessed/performed      Past Medical History:  Diagnosis Date  . Cervical myelopathy with cervical radiculopathy   . Depression   . Hypertension   . Sleep apnea     Past Surgical History:  Procedure Laterality Date  . EYE SURGERY    . FOOT SURGERY Left   . SPINE SURGERY     ACDF C3-4  . VAGINAL HYSTERECTOMY      There were no vitals filed for this visit.      Subjective Assessment - 02/27/16 1420    Subjective  Patient reports she has been trying to do exercises at home for legs and arms.    Pertinent History Pt. presents with numbness and tingling in bilateral hands. Pt. had a cervical Fusion in 2014. Numbness has improved  after surgery, and following therapy in 2016. Pt. has had a worsening of numbness and tingling, and now presents for skilled OT Chambers secondary to numbness interfering with ADL tasks.   Patient Stated Goals I want to try and avoid surgery   Currently in Pain? No/denies   Pain Score 0-No pain                      OT Treatments/Exercises (OP) - 02/28/16 1826      Fine Motor Coordination   Other Fine Motor Exercises Patient seen for fine motor coordination tasks with focus on small 1/2 inch pegs placed into putty, removing with pinch and  prehension patterns and placing into board.  Patient advanced to small objects of 1/4 inch pearler beads and removing with foreceps with cues for technique.      Neurological Re-education Exercises   Other Exercises 1 Patient seen for ROM and stregnthening exercises with 2# dowel for shoulder flexion, ABD/ADD, chest press, forwards/backwards circles, elbow flexion/ext and diagonal patterns for 12 reps for 2 sets each with cues for form and technique.                 OT Education - 02/28/16 1822    Education provided Yes   Education Details strength, coordination   Person(s) Educated Patient   Methods Explanation;Demonstration;Verbal cues   Comprehension Verbal cues required;Returned demonstration;Verbalized understanding             OT Long Term Goals - 02/28/16 1830      OT LONG TERM GOAL #1   Title Pt. will improve left hand [facility] skills by 3 sec. to be able to handle change efficiently when shopping.   Baseline Right 25 sec., Left 36 sec. Pt. has difficulty   Time 12   Period Weeks   Status On-going     OT LONG TERM GOAL #2   Title  Pt. will improve Left grip strength to be able to independently hold and pour from a pitcher.   Baseline Right: 60#, Left 45#   Time 12   Period Weeks   Status On-going     OT LONG TERM GOAL #3   Title Pt. will improve left pinch strength to be able to cut meat/veggies, and peel potatoes independently and efficiently.   Baseline Pt. has difficulty   Time 12   Period Weeks   Status On-going     OT LONG TERM GOAL #4   Title Pt. will independently and efficiently button shirts, and pants.   Baseline Pt. has difficulty   Time 12   Period Weeks   Status On-going     OT LONG TERM GOAL #5   Title Pt. will be able to independently apply earrings   Baseline Pt. has difficulty   Time 12   Period Weeks   Status On-going               Plan - 02/28/16 1823    Clinical Impression Statement Patient reports decreased numbness at  times and increased use of BUE for functional tasks.  Patient challenged this date with small objects, less than 1/2 inch in size.  Patient continues to benefit from skilled OT to improve UE function to improve independence in daily tasks.    Rehab Potential Good   Clinical Impairments Affecting Rehab Potential Positive indicators: motivation, family support, age. Negative Indicators: multiple comorbidities.   OT Frequency 2x / week   OT Duration 12 weeks   OT Treatment/Interventions Self-care/ADL training;Therapeutic exercise;Patient/family education;Therapeutic exercises;Energy conservation   Consulted and Agree with Plan of Care Patient      Patient will benefit from skilled therapeutic intervention in order to improve the following deficits and impairments:  Decreased strength, Impaired UE functional use, Decreased range of motion, Decreased knowledge of use of DME  Visit Diagnosis: Muscle weakness (generalized)  Other lack of coordination    Problem List Patient Active Problem List   Diagnosis Date Noted  . Cervical myelopathy with cervical radiculopathy 01/26/2016  . Lumbosacral radiculopathy 01/26/2016  . S/P cervical spinal fusion 01/26/2016   Amy T Lovett, OTR/L, CLT  Lovett,Amy 02/28/2016, 6:31 PM  [place] MAIN [facility] Chambers [address] [place], [place], [address] Phone: [phone]   Fax:  [phone]  Name: Anna Chambers MRN: [id number] Date of Birth: 05/17/1940

## 2016-02-29 ENCOUNTER — Ambulatory Visit: Payer: Medicare Other

## 2016-02-29 ENCOUNTER — Ambulatory Visit: Payer: Medicare Other | Admitting: Occupational Therapy

## 2016-02-29 DIAGNOSIS — M6281 Muscle weakness (generalized): Secondary | ICD-10-CM

## 2016-02-29 DIAGNOSIS — R278 Other lack of coordination: Secondary | ICD-10-CM

## 2016-02-29 DIAGNOSIS — R2681 Unsteadiness on feet: Secondary | ICD-10-CM

## 2016-02-29 NOTE — Patient Instructions (Addendum)
OT TREATMENT    Neuro muscular re-education:  Pt. worked on grasping, flipping, and stacking pegs on the [employer]. Pt. Required cues for hand movements. Pt. Worked on untying knots with  Bilateral hand coordination, Pt. Worked on grasping mini clips, and placing them in various positions on a vertical plane. Pt. Worked on untying knots with bilateral hands.  Therapeutic Exercise:  Pt. performed resistive EZ Board exercises for forearm supination/pronation, wrist flexion/extension using gross grasp, and lateral pinch (key) grasp. Pt. performed resistive EZ Board exercises angled in several planes to promote shoulder flexion, abduction, and wrist flexion, and extension while performing resistive wrist flexion and extension with a gross grip.

## 2016-02-29 NOTE — Therapy (Signed)
[place] MAIN [facility] SERVICES [address] [place], [place], [address] Phone: [phone]   Fax:  [phone]  Occupational Therapy Treatment  Patient Details  Name: Anna Chambers MRN: [id number] Date of Birth: 24-Jun-1940 Referring Provider: Dr. Benjamine Sprague  Encounter Date: 02/29/2016      OT End of Session - 02/29/16 1402    Visit Number 6   Number of Visits 24   Date for OT Re-Evaluation 05/01/16   Authorization Type Medicare G-code 6 of 10   Authorization - Number of Visits 24   OT Start Time [id number]   OT Stop Time 1430   OT Time Calculation (min) 42 min   Activity Tolerance Patient tolerated treatment well   Behavior During Therapy [facility] for tasks assessed/performed      Past Medical History:  Diagnosis Date  . Cervical myelopathy with cervical radiculopathy   . Depression   . Hypertension   . Sleep apnea     Past Surgical History:  Procedure Laterality Date  . EYE SURGERY    . FOOT SURGERY Left   . SPINE SURGERY     ACDF C3-4  . VAGINAL HYSTERECTOMY      There were no vitals filed for this visit.      Subjective Assessment - 02/29/16 1355    Subjective  Pt. reports that she does not need to follow-up with her physician until March.   Pertinent History Pt. presents with numbness and tingling in bilateral hands. Pt. had a cervical Fusion in 2014. Numbness has improved  after surgery, and following therapy in 2016. Pt. has had a worsening of numbness and tingling, and now presents for skilled OT services secondary to numbness interfering with ADL tasks.   Currently in Pain? No/denies   Pain Score 0-No pain                      OT Treatments/Exercises (OP) - 02/29/16 1742      Fine Motor Coordination   Other Fine Motor Exercises Pt. worked on grasping, flipping, and stacking pegs on the [employer]. Pt. Required cues for hand movements. Pt. Worked on untying knots with  Bilateral hand coordination, Pt.  Worked on grasping mini clips, and placing them in various positions on a vertical plane. Pt. Worked on untying knots with bilateral hands.     Neurological Re-education Exercises   Other Exercises 1 Pt. performed resistive EZ Board exercises for forearm supination/pronation, wrist flexion/extension using gross grasp, and lateral pinch (key) grasp. Pt. performed resistive EZ Board exercises angled in several planes to promote shoulder flexion, abduction, and wrist flexion, and extension while performing resistive wrist flexion and extension with a gross grip.                OT Education - 02/29/16 1359    Education provided Yes   Education Details Strength, and coordination skills.   Person(s) Educated Patient   Methods Explanation;Demonstration;Verbal cues   Comprehension Verbalized understanding;Returned demonstration;Verbal cues required             OT Long Term Goals - 02/28/16 1830      OT LONG TERM GOAL #1   Title Pt. will improve left hand [facility] skills by 3 sec. to be able to handle change efficiently when shopping.   Baseline Right 25 sec., Left 36 sec. Pt. has difficulty   Time 12   Period Weeks   Status On-going     OT LONG  TERM GOAL #2   Title Pt. will improve Left grip strength to be able to independently hold and pour from a pitcher.   Baseline Right: 60#, Left 45#   Time 12   Period Weeks   Status On-going     OT LONG TERM GOAL #3   Title Pt. will improve left pinch strength to be able to cut meat/veggies, and peel potatoes independently and efficiently.   Baseline Pt. has difficulty   Time 12   Period Weeks   Status On-going     OT LONG TERM GOAL #4   Title Pt. will independently and efficiently button shirts, and pants.   Baseline Pt. has difficulty   Time 12   Period Weeks   Status On-going     OT LONG TERM GOAL #5   Title Pt. will be able to independently apply earrings   Baseline Pt. has difficulty   Time 12   Period Weeks   Status  On-going               Plan - 02/29/16 1404    Clinical Impression Statement Pt. continues to present with impaired coordination skills. Pt. continues to work on improving [facility] skills, and bilateral hand coordination skills. Pt. requires verbal cues, and cues for visual demonstration for [facility] movement patterns. Pt. conontinues to benefit from skilled OT services to work on improving [facility] skills for ADL functioning.   Rehab Potential Good   Clinical Impairments Affecting Rehab Potential Positive indicators: motivation, family support, age. Negative Indicators: multiple comorbidities.   OT Frequency 2x / week   OT Duration 2 weeks   OT Treatment/Interventions Self-care/ADL training;Therapeutic exercise;Patient/family education;Therapeutic exercises;Energy conservation   Consulted and Agree with Plan of Care Patient      Patient will benefit from skilled therapeutic intervention in order to improve the following deficits and impairments:  Decreased strength, Impaired UE functional use, Decreased range of motion, Decreased knowledge of use of DME  Visit Diagnosis: Muscle weakness (generalized)    Problem List Patient Active Problem List   Diagnosis Date Noted  . Cervical myelopathy with cervical radiculopathy 01/26/2016  . Lumbosacral radiculopathy 01/26/2016  . S/P cervical spinal fusion 01/26/2016    [name], MS, OTR/L 02/29/2016, 5:44 PM  [place] MAIN [facility] SERVICES [address] [place], [place], [address] Phone: [phone]   Fax:  [phone]  Name: Anna Chambers MRN: [id number] Date of Birth: May 16, 1940

## 2016-02-29 NOTE — Therapy (Signed)
[place] MAIN [facility] SERVICES [address] [place], [place], [address] Phone: [phone]   Fax:  [phone]  Physical Therapy Treatment  Patient Details  Name: Anna Chambers MRN: [id number] Date of Birth: [birth year] Referring Provider: Alda Berthold  Encounter Date: 02/29/2016      PT End of Session - 02/29/16 1444    Visit Number 6   Number of Visits 17   Date for PT Re-Evaluation 04-14-16   Authorization Type G codes   Authorization Time Period 6/10 G code   PT Start Time 1430   PT Stop Time 1515   PT Time Calculation (min) 45 min   Equipment Utilized During Treatment Gait belt   Activity Tolerance Patient tolerated treatment well   Behavior During Therapy [facility] for tasks assessed/performed      Past Medical History:  Diagnosis Date  . Cervical myelopathy with cervical radiculopathy   . Depression   . Hypertension   . Sleep apnea     Past Surgical History:  Procedure Laterality Date  . EYE SURGERY    . FOOT SURGERY Left   . SPINE SURGERY     ACDF C3-4  . VAGINAL HYSTERECTOMY      There were no vitals filed for this visit.      Subjective Assessment - 02/29/16 1441    Subjective Patient reports no complaints since the previous visit. Patient reports moderate soreness after the previous treatment.    Pertinent History Pt presents today with LLE weakness and impaired sensation which she reports began back in 2014 following her ACDF C3-4.  She received PT at this clinic at the end of last year for ~6 weeks with some improvement.  Pt reports she feels week and has poor control of her LLE from her L knee down to her foot.  Pt not wearing AFO to PT evaluation but pt reports she wears it and uses SPC when ambulating longer distances.  Does not use AD or AFO at home.  Denies any falls in the past 6 months. Pt feels as though she drags her L foot when ambulating.  Pt reports she enjoys walking at the park, can walk ~400 yards before  needing to rest.  Still driving and independent with bathing, cooking.  Pt's goal is to strengthen her LLE and to decrease numbness and tingling in Bil hands (will work mainly with OT on UEs).   Pt with h/o Bil numbness/tingling from shoulder down to fingertips L>R).   Pt still with difficulty putting on earrings, opening cans, and buttoning her shirt (OT notified).  Has her husband assist with these tasks. 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    How long can you walk comfortably? ~400 yards   Diagnostic tests NCS/EMG   Patient Stated Goals to be able to walk without dragging L foot; to strengthen LLE   Currently in Pain? No/denies   Pain Onset More than a month ago      TREATMENT  Ther-ex Nustep Level 2 seat position 7 - 5 min Heel raises on blue half foam with UE support - 2x20 Standing hip abduction on airex pad - 2x 20 B Leg Press with focus on quadriceps activation - 2 x 20;  120#   Neuromuscular Re-education Side  stepping up and over two airex pads  - x 10 B Elongated widened tandem stance ant/post weight shift on 2 airex pads --  x 15 Stepping over half foam with cueing on clearing feet B from double blue airex  - x 15 B Tandem amb forward/backward - 2 x 1ft Backward ambulation EO, EC - 2 x 8ft Heel walking - 2 x 28ft Toe walking - 2 x 42ft       PT Education - 02/29/16 1444    Education provided Yes   Education Details form/technique with exercise performance   Person(s) Educated Patient   Methods Explanation;Demonstration   Comprehension Verbalized understanding;Returned demonstration             PT Long Term Goals - 02/14/16 1022      PT LONG TERM GOAL #1   Title Pt will improve 5xSTS by at least 3 seconds to demonstrate improved BLE strenth and  balance   Baseline 14.17 sec   Time 6   Period Weeks   Status New     PT LONG TERM GOAL #2   Title L DF and hip F strength will improve to 4/5 for decreased risk of falling   Baseline 3+/5 L hip F, 3/5 L DF   Time 8   Period Weeks   Status New     PT LONG TERM GOAL #3   Title Pt will improve Berg Balance Test score to at least 52/56 to demonstrate improved balance and decreased risk of falling   Baseline 45/56   Time 8   Period Weeks   Status New     PT LONG TERM GOAL #4   Title Pt will be able to ambulate in community without use of SPC to demonstrate improved balance and endurance.   Baseline SPC for community distances   Time 8   Period Weeks   Status New     PT LONG TERM GOAL #5   Title Pt will increase 6MWT by at least 57m (185ft) in order to demonstrate clinically significant improvement in cardiopulmonary endurance and community ambulation   Baseline 02/14/16   Time 8   Period Weeks   Status New               Plan - 02/29/16 1515    Clinical Impression Statement Patient demonstrates improvement in balance performance requiring less use of UEs throughout session. However patient demonstrates decreased muscular endurance with increased fatigue after exercise performance. Patient will benefit from further skilled therapy focused on performing strengthening and balance activities to return to prior level of function.    Rehab Potential Fair   Clinical Impairments Affecting Rehab Potential (- and +) already received PT for same symptoms with some improvement.  (-) chronicity of symptoms since 2014.  (+) pt very positive and motivated.   PT Frequency 2x / week   PT Duration 8 weeks   PT Treatment/Interventions ADLs/Self Care Home Management;Aquatic Therapy;Biofeedback;Electrical Stimulation;DME Instruction;Gait training;Stair training;Functional mobility training;Therapeutic activities;Therapeutic exercise;Balance training;Neuromuscular re-education;Patient/family  education;Orthotic Fit/Training;Manual techniques;Passive range of motion;Dry needling;Energy conservation;Taping;Visual/perceptual remediation/compensation   PT Next Visit Plan administer 71mWT; Review HEP and progress balance and strengthening exercises.   PT Home Exercise Plan Seated DF with RTB resist; Bridging, sit to stand without UE support, heel/toe rocking, semitandem/tandem balance   Consulted and Agree with Plan of Care Patient      Patient will benefit from skilled therapeutic intervention in order to improve the following deficits and impairments:  Abnormal gait, Decreased activity  tolerance, Decreased balance, Decreased endurance, Decreased knowledge of use of DME, Decreased range of motion, Decreased safety awareness, Decreased strength, Difficulty walking, Increased fascial restricitons, Impaired perceived functional ability, Impaired sensation, Impaired UE functional use, Impaired vision/preception, Improper body mechanics, Postural dysfunction  Visit Diagnosis: Muscle weakness (generalized)  Other lack of coordination  Unsteadiness on feet     Problem List Patient Active Problem List   Diagnosis Date Noted  . Cervical myelopathy with cervical radiculopathy 01/26/2016  . Lumbosacral radiculopathy 01/26/2016  . S/P cervical spinal fusion 01/26/2016    Blythe Stanford, PT DPT 02/29/2016, 3:18 PM  [place] MAIN [facility] SERVICES [address] [place], [place], [address] Phone: [phone]   Fax:  [phone]  Name: Anna Chambers MRN: [id number] Date of Birth: 1941-01-24

## 2016-03-05 ENCOUNTER — Ambulatory Visit: Payer: Medicare Other

## 2016-03-05 ENCOUNTER — Ambulatory Visit: Payer: Medicare Other | Admitting: Occupational Therapy

## 2016-03-05 DIAGNOSIS — M6281 Muscle weakness (generalized): Secondary | ICD-10-CM

## 2016-03-05 DIAGNOSIS — R278 Other lack of coordination: Secondary | ICD-10-CM

## 2016-03-05 DIAGNOSIS — R2681 Unsteadiness on feet: Secondary | ICD-10-CM

## 2016-03-05 NOTE — Therapy (Signed)
[place] MAIN [facility] SERVICES [address] [place], [place], [address] Phone: [phone]   Fax:  [phone]  Occupational Therapy Treatment  Patient Details  Name: Anna Chambers MRN: [id number] Date of Birth: 12/04/1940 Referring Provider: Dr. Benjamine Sprague  Encounter Date: 03/05/2016      OT End of Session - 03/05/16 1405    Visit Number 7   Number of Visits 24   Date for OT Re-Evaluation 05/01/16   Authorization Type Medicare G-code 7 of 10   Authorization - Number of Visits 24   OT Start Time [id number]   OT Stop Time 1430   OT Time Calculation (min) 45 min   Activity Tolerance Patient tolerated treatment well   Behavior During Therapy [facility] for tasks assessed/performed      Past Medical History:  Diagnosis Date  . Cervical myelopathy with cervical radiculopathy   . Depression   . Hypertension   . Sleep apnea     Past Surgical History:  Procedure Laterality Date  . EYE SURGERY    . FOOT SURGERY Left   . SPINE SURGERY     ACDF C3-4  . VAGINAL HYSTERECTOMY      There were no vitals filed for this visit.      Subjective Assessment - 03/05/16 1358    Subjective  Pt. reports she does not have any theraputty at home.   Pertinent History Pt. presents with numbness and tingling in bilateral hands. Pt. had a cervical Fusion in 2014. Numbness has improved  after surgery, and following therapy in 2016. Pt. has had a worsening of numbness and tingling, and now presents for skilled OT services secondary to numbness interfering with ADL tasks.   Currently in Pain? No/denies                      OT Treatments/Exercises (OP) - 03/05/16 1457      Fine Motor Coordination   Other Fine Motor Exercises Pt. worked on grasping small 1/8" beads, and placing them on a small board using needle nose tweezers. Pt. had difficulty grasping small pegs with      Neurological Re-education Exercises   Other Exercises 1 Pt. performed gross  gripping with grip strengthener. Pt. worked on sustaining grip while grasping pegs and reaching at various heights. Gripper was placed in the 3rd  resistive slot with the white resistive spring. Pt. worked on pink theraputty exercises for bilateral hand strength with gross gripping, gross digit extension, thumb abduction, thumb opposition, lateral pinch, and 3 pt. pinch strengthening.                 OT Education - 03/05/16 1359    Education provided Yes   Education Details [facility] skills, Theraputty.   Person(s) Educated Patient   Methods Explanation;Demonstration   Comprehension Verbalized understanding;Returned demonstration             OT Long Term Goals - 02/28/16 1830      OT LONG TERM GOAL #1   Title Pt. will improve left hand [facility] skills by 3 sec. to be able to handle change efficiently when shopping.   Baseline Right 25 sec., Left 36 sec. Pt. has difficulty   Time 12   Period Weeks   Status On-going     OT LONG TERM GOAL #2   Title Pt. will improve Left grip strength to be able to independently hold and pour from a pitcher.   Baseline Right: 60#,  Left 45#   Time 12   Period Weeks   Status On-going     OT LONG TERM GOAL #3   Title Pt. will improve left pinch strength to be able to cut meat/veggies, and peel potatoes independently and efficiently.   Baseline Pt. has difficulty   Time 12   Period Weeks   Status On-going     OT LONG TERM GOAL #4   Title Pt. will independently and efficiently button shirts, and pants.   Baseline Pt. has difficulty   Time 12   Period Weeks   Status On-going     OT LONG TERM GOAL #5   Title Pt. will be able to independently apply earrings   Baseline Pt. has difficulty   Time 12   Period Weeks   Status On-going               Plan - 03/05/16 1440    Clinical Impression Statement Pt. continues to improve with [facility] skills, and improving his overall ADL functioning. Pt. could benefit from skilled OT services to improve  [facility] skills during ADL/IADL tasks.    Rehab Potential Good   Clinical Impairments Affecting Rehab Potential Positive indicators: motivation, family support, age. Negative Indicators: multiple comorbidities.   OT Frequency 2x / week   OT Duration 2 weeks   OT Treatment/Interventions Self-care/ADL training;Therapeutic exercise;Patient/family education;Therapeutic exercises;Energy conservation   Consulted and Agree with Plan of Care Patient      Patient will benefit from skilled therapeutic intervention in order to improve the following deficits and impairments:  Decreased strength, Impaired UE functional use, Decreased range of motion, Decreased knowledge of use of DME  Visit Diagnosis: Muscle weakness (generalized)  Other lack of coordination    Problem List Patient Active Problem List   Diagnosis Date Noted  . Cervical myelopathy with cervical radiculopathy 01/26/2016  . Lumbosacral radiculopathy 01/26/2016  . S/P cervical spinal fusion 01/26/2016    Anna Carina, MS, OTR/L 03/05/2016, 3:03 PM  [place] MAIN [facility] SERVICES [address] [place], [place], [address] Phone: [phone]   Fax:  [phone]  Name: Anna Chambers MRN: [id number] Date of Birth: 11/30/1940

## 2016-03-05 NOTE — Patient Instructions (Addendum)
OT TREATMENT    Neuro muscular re-education:  Pt. worked on grasping small 1/8" beads, and placing them on a small board using needle nose tweezers. Pt. had difficulty grasping small pegs with  the tweezers.   Therapeutic Exercise:  Pt. performed gross gripping with grip strengthener. Pt. worked on sustaining grip while grasping pegs and reaching at various heights. Gripper was placed in the 3rd  resistive slot with the white resistive spring. Pt. worked on pink theraputty exercises for bilateral hand strength with gross gripping, gross digit extension, thumb abduction, thumb opposition, lateral pinch, and 3 pt. pinch strengthening.

## 2016-03-05 NOTE — Therapy (Signed)
[place] MAIN [facility] SERVICES [address] [place], [place], [address] Phone: [phone]   Fax:  [phone]  Physical Therapy Treatment  Patient Details  Name: Anna Chambers MRN: [id number] Date of Birth: August 15, 1940 Referring Provider: Alda Berthold  Encounter Date: 03/05/2016      PT End of Session - 03/05/16 1346    Visit Number 7   Number of Visits 17   Date for PT Re-Evaluation 04-16-16   Authorization Type G codes   Authorization Time Period 7/10 G Code   PT Start Time [id number]   PT Stop Time 1345   PT Time Calculation (min) 40 min   Equipment Utilized During Treatment Gait belt   Activity Tolerance Patient tolerated treatment well   Behavior During Therapy [facility] for tasks assessed/performed      Past Medical History:  Diagnosis Date  . Cervical myelopathy with cervical radiculopathy   . Depression   . Hypertension   . Sleep apnea     Past Surgical History:  Procedure Laterality Date  . EYE SURGERY    . FOOT SURGERY Left   . SPINE SURGERY     ACDF C3-4  . VAGINAL HYSTERECTOMY      There were no vitals filed for this visit.      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    Pertinent History Pt presents today with LLE weakness and impaired sensation which she reports began back in 2014 following her ACDF C3-4.  She received PT at this clinic at the end of last year for ~6 weeks with some improvement.  Pt reports she feels week and has poor control of her LLE from her L knee down to her foot.  Pt not wearing AFO to PT evaluation but pt reports she wears it and uses SPC when ambulating longer distances.  Does not use AD or AFO at home.  Denies any falls in the past 6 months. Pt feels as though she drags her L foot when ambulating.  Pt reports she enjoys walking at the park, can walk ~400 yards before  needing to rest.  Still driving and independent with bathing, cooking.  Pt's goal is to strengthen her LLE and to decrease numbness and tingling in Bil hands (will work mainly with OT on UEs).   Pt with h/o Bil numbness/tingling from shoulder down to fingertips L>R).   Pt still with difficulty putting on earrings, opening cans, and buttoning her shirt (OT notified).  Has her husband assist with these tasks. 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    How long can you walk comfortably? ~400 yards   Diagnostic tests NCS/EMG   Patient Stated Goals to be able to walk without dragging L foot; to strengthen LLE   Currently in Pain? No/denies   Pain Onset More than a month ago         TREATMENT  Ther-ex Leg Press with focus on quadriceps activation - 2 x 20; 135# Left ankle dorsiflexion against Green theraband - 2 x 20    Neuromuscular Re-education Heel/toe raises on airex pad - 2x 20  Stepping over half foam with cueing on clearing  feet B with cueing on heel strike - 2 x 20 Single leg stance - 3 x 30sec B Amb with walking sticks for UE swing assist - 6 x 81ft both directions Ambulation with cueing on improving arm swing - 4 x 30 ft; ambulation with cueing on improving heel strike and tibialis anterior activation - 4 x 30 ft Hip abduction in standing on airex pad - x20 B        PT Education - 03/05/16 1337    Education provided Yes   Education Details form/technique with exercises   Person(s) Educated Patient   Methods Explanation;Demonstration   Comprehension Verbalized understanding;Returned demonstration             PT Long Term Goals - 02/14/16 1022      PT LONG TERM GOAL #1   Title Pt will improve 5xSTS by at least 3 seconds to demonstrate improved BLE strenth and  balance   Baseline 14.17 sec   Time 6   Period Weeks   Status New     PT LONG TERM GOAL #2   Title L DF and hip F strength will improve to 4/5 for decreased risk of falling   Baseline 3+/5 L hip F, 3/5 L DF   Time 8   Period Weeks   Status New     PT LONG TERM GOAL #3   Title Pt will improve Berg Balance Test score to at least 52/56 to demonstrate improved balance and decreased risk of falling   Baseline 45/56   Time 8   Period Weeks   Status New     PT LONG TERM GOAL #4   Title Pt will be able to ambulate in community without use of SPC to demonstrate improved balance and endurance.   Baseline SPC for community distances   Time 8   Period Weeks   Status New     PT LONG TERM GOAL #5   Title Pt will increase 6MWT by at least 28m (160ft) in order to demonstrate clinically significant improvement in cardiopulmonary endurance and community ambulation   Baseline 02/14/16   Time 8   Period Weeks   Status New               Plan - 03/05/16 1438    Clinical Impression Statement Focused on performing activites to improve drop foot symptoms and patient demonstrates improvement with foot progression after VCs to improve heel strike. Patient continues to demonstrate decreased balance requiring intermittent UE support to perform balance activities. Patient will benefit from further skilled therapy to return to prior level of function.    Rehab Potential Fair   Clinical Impairments Affecting Rehab Potential (- and +) already received PT for same symptoms with some improvement.  (-) chronicity of symptoms since 2014.  (+) pt very positive and motivated.   PT Frequency 2x / week   PT Duration 8 weeks   PT Treatment/Interventions ADLs/Self Care Home Management;Aquatic Therapy;Biofeedback;Electrical Stimulation;DME Instruction;Gait training;Stair training;Functional mobility training;Therapeutic activities;Therapeutic exercise;Balance training;Neuromuscular re-education;Patient/family  education;Orthotic Fit/Training;Manual techniques;Passive range of motion;Dry needling;Energy conservation;Taping;Visual/perceptual remediation/compensation   PT Next Visit Plan administer 76mWT; Review HEP and progress balance and strengthening exercises.   PT Home Exercise Plan Seated DF with RTB resist; Bridging, sit to stand without UE support, heel/toe rocking, semitandem/tandem balance   Consulted and Agree with Plan of Care Patient      Patient will benefit from skilled therapeutic intervention in order to improve the following deficits and impairments:  Abnormal gait,  Decreased activity tolerance, Decreased balance, Decreased endurance, Decreased knowledge of use of DME, Decreased range of motion, Decreased safety awareness, Decreased strength, Difficulty walking, Increased fascial restricitons, Impaired perceived functional ability, Impaired sensation, Impaired UE functional use, Impaired vision/preception, Improper body mechanics, Postural dysfunction  Visit Diagnosis: Muscle weakness (generalized)  Other lack of coordination  Unsteadiness on feet     Problem List Patient Active Problem List   Diagnosis Date Noted  . Cervical myelopathy with cervical radiculopathy 01/26/2016  . Lumbosacral radiculopathy 01/26/2016  . S/P cervical spinal fusion 01/26/2016    Blythe Stanford, PT DPT 03/05/2016, 2:42 PM  [place] MAIN [facility] SERVICES [address] [place], [place], [address] Phone: [phone]   Fax:  [phone]  Name: Anna Chambers MRN: [id number] Date of Birth: 1940/08/31

## 2016-03-07 ENCOUNTER — Ambulatory Visit: Payer: Medicare Other | Admitting: Occupational Therapy

## 2016-03-07 ENCOUNTER — Ambulatory Visit: Payer: Medicare Other

## 2016-03-07 ENCOUNTER — Encounter: Payer: Self-pay | Admitting: Occupational Therapy

## 2016-03-07 DIAGNOSIS — M6281 Muscle weakness (generalized): Secondary | ICD-10-CM

## 2016-03-07 DIAGNOSIS — R278 Other lack of coordination: Secondary | ICD-10-CM

## 2016-03-07 DIAGNOSIS — R2681 Unsteadiness on feet: Secondary | ICD-10-CM

## 2016-03-07 NOTE — Therapy (Signed)
[place] MAIN [facility] SERVICES [address] [place], [place], [address] Phone: [phone]   Fax:  [phone]  Physical Therapy Treatment  Patient Details  Name: Anna Chambers MRN: [id number] Date of Birth: Oct 02, 1940 Referring Provider: Alda Berthold  Encounter Date: 03/07/2016      PT End of Session - 03/07/16 1513    Visit Number 8   Number of Visits 17   Date for PT Re-Evaluation 26-Apr-2016   Authorization Type G codes   Authorization Time Period 8/10 G Code   PT Start Time 1430   PT Stop Time 1513   PT Time Calculation (min) 43 min   Equipment Utilized During Treatment Gait belt   Activity Tolerance Patient tolerated treatment well   Behavior During Therapy [facility] for tasks assessed/performed      Past Medical History:  Diagnosis Date  . Cervical myelopathy with cervical radiculopathy   . Depression   . Hypertension   . Sleep apnea     Past Surgical History:  Procedure Laterality Date  . EYE SURGERY    . FOOT SURGERY Left   . SPINE SURGERY     ACDF C3-4  . VAGINAL HYSTERECTOMY      There were no vitals filed for this visit.      Subjective Assessment - 03/07/16 1447    Subjective Patient reports no major changes since the previous visit. States she continues to feel uncoordinated with her walking.    Pertinent History Pt presents today with LLE weakness and impaired sensation which she reports began back in 2014 following her ACDF C3-4.  She received PT at this clinic at the end of last year for ~6 weeks with some improvement.  Pt reports she feels week and has poor control of her LLE from her L knee down to her foot.  Pt not wearing AFO to PT evaluation but pt reports she wears it and uses SPC when ambulating longer distances.  Does not use AD or AFO at home.  Denies any falls in the past 6 months. Pt feels as though she drags her L foot when ambulating.  Pt reports she enjoys walking at the park, can walk ~400 yards before  needing to rest.  Still driving and independent with bathing, cooking.  Pt's goal is to strengthen her LLE and to decrease numbness and tingling in Bil hands (will work mainly with OT on UEs).   Pt with h/o Bil numbness/tingling from shoulder down to fingertips L>R).   Pt still with difficulty putting on earrings, opening cans, and buttoning her shirt (OT notified).  Has her husband assist with these tasks. 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    How long can you walk comfortably? ~400 yards   Diagnostic tests NCS/EMG   Patient Stated Goals to be able to walk without dragging L foot; to strengthen LLE   Currently in Pain? No/denies   Pain Onset More than a month ago      TREATMENT  Ther-ex Nustep with seat position at 8 - 5 min  Leg Press with focus on quadriceps activation - 2 x 15; 150# Left ankle dorsiflexion against Green theraband - 2 x 20    Neuromuscular Re-education  Heel/toe raises on airex pad - 2x 20  Heel walking - 4 x 33ft Toe walking - 4 x 50ft Ambulation with cueing on improving arm swing and with cueing on improving heel strike and tibialis anterior activation - 4 x 30 ft Tandem ambulation with cueing on foot placement- 4 x 41ft  Bosu ball marches - x 20 B  Single leg stance - 2 x 30sec B        PT Education - 03/07/16 1505    Education provided Yes   Education Details Education of step length and heel strike   Person(s) Educated Patient   Methods Explanation;Demonstration   Comprehension Verbalized understanding;Returned demonstration             PT Long Term Goals - 02/14/16 1022      PT LONG TERM GOAL #1   Title Pt will improve 5xSTS by at least 3 seconds to demonstrate improved BLE strenth and balance   Baseline 14.17 sec   Time 6   Period  Weeks   Status New     PT LONG TERM GOAL #2   Title L DF and hip F strength will improve to 4/5 for decreased risk of falling   Baseline 3+/5 L hip F, 3/5 L DF   Time 8   Period Weeks   Status New     PT LONG TERM GOAL #3   Title Pt will improve Berg Balance Test score to at least 52/56 to demonstrate improved balance and decreased risk of falling   Baseline 45/56   Time 8   Period Weeks   Status New     PT LONG TERM GOAL #4   Title Pt will be able to ambulate in community without use of SPC to demonstrate improved balance and endurance.   Baseline SPC for community distances   Time 8   Period Weeks   Status New     PT LONG TERM GOAL #5   Title Pt will increase 6MWT by at least 21m (151ft) in order to demonstrate clinically significant improvement in cardiopulmonary endurance and community ambulation   Baseline 02/14/16   Time 8   Period Weeks   Status New               Plan - 03/07/16 1513    Clinical Impression Statement Continued to focus on improving gait pattern with emphasis on improving foot clearance during swing phase. Patient demonstrates improvement in arm swing and steppage pattern requiring less cues to perform. Although patient is improving, she continues to demonstrate decreased dynamic balance and coordination with movement; patient will benefit from further skilled therapy to return to prior level of function.     Rehab Potential Fair   Clinical Impairments Affecting Rehab Potential (- and +) already received PT for same symptoms with some improvement.  (-) chronicity of symptoms since 2014.  (+) pt very positive and motivated.   PT Frequency 2x / week   PT Duration 8 weeks   PT Treatment/Interventions ADLs/Self Care Home Management;Aquatic Therapy;Biofeedback;Electrical Stimulation;DME Instruction;Gait training;Stair training;Functional mobility training;Therapeutic activities;Therapeutic exercise;Balance training;Neuromuscular re-education;Patient/family  education;Orthotic Fit/Training;Manual techniques;Passive range of motion;Dry needling;Energy conservation;Taping;Visual/perceptual remediation/compensation   PT Next Visit Plan administer 69mWT; Review HEP and progress balance and strengthening exercises.   PT Home Exercise Plan Seated DF with RTB resist; Bridging, sit to stand without UE support, heel/toe rocking, semitandem/tandem balance   Consulted and Agree with Plan of Care Patient      Patient will benefit from skilled therapeutic intervention in order to improve the following deficits  and impairments:  Abnormal gait, Decreased activity tolerance, Decreased balance, Decreased endurance, Decreased knowledge of use of DME, Decreased range of motion, Decreased safety awareness, Decreased strength, Difficulty walking, Increased fascial restricitons, Impaired perceived functional ability, Impaired sensation, Impaired UE functional use, Impaired vision/preception, Improper body mechanics, Postural dysfunction  Visit Diagnosis: Other lack of coordination  Muscle weakness (generalized)  Unsteadiness on feet     Problem List Patient Active Problem List   Diagnosis Date Noted  . Cervical myelopathy with cervical radiculopathy 01/26/2016  . Lumbosacral radiculopathy 01/26/2016  . S/P cervical spinal fusion 01/26/2016    Blythe Stanford, PT DPT 03/07/2016, 3:17 PM  [place] MAIN [facility] SERVICES [address] [place], [place], [address] Phone: [phone]   Fax:  [phone]  Name: Anna Chambers MRN: [id number] Date of Birth: 09/12/1940

## 2016-03-07 NOTE — Therapy (Signed)
[place] MAIN [facility] SERVICES [address] [place], [place], [address] Phone: [phone]   Fax:  [phone]  Occupational Therapy Treatment  Patient Details  Name: Anna Chambers MRN: [id number] Date of Birth: 1940-11-02 Referring Provider: Dr. Benjamine Sprague  Encounter Date: 03/07/2016      OT End of Session - 03/07/16 1355    Visit Number 8   Number of Visits 24   Date for OT Re-Evaluation 05/01/16   Authorization Type Medicare G-code 8 of 10   OT Start Time [id number]   OT Stop Time 1430   OT Time Calculation (min) 45 min   Activity Tolerance Patient tolerated treatment well   Behavior During Therapy [facility] for tasks assessed/performed      Past Medical History:  Diagnosis Date  . Cervical myelopathy with cervical radiculopathy   . Depression   . Hypertension   . Sleep apnea     Past Surgical History:  Procedure Laterality Date  . EYE SURGERY    . FOOT SURGERY Left   . SPINE SURGERY     ACDF C3-4  . VAGINAL HYSTERECTOMY      There were no vitals filed for this visit.      Subjective Assessment - 03/07/16 1351    Subjective  Pt.  reports the exercises are going well with the putty, but would like to review them.   Currently in Pain? No/denies                      OT Treatments/Exercises (OP) - 03/07/16 1419      Fine Motor Coordination   Other Fine Motor Exercises Pt. performed [place] tasks using the Grooved pegboard. Pt. worked on grasping the grooved pegs from a horizontal position, and moving the pegs to a vertical position in the hand to prepare for placing them in the grooved slot.      Neurological Re-education Exercises   Other Exercises 1 Pt. worked on pink theraputty exercises for gross digit flexion, extension, thumb abduction, thumb opposition, lateral pinch, 2pt. Pinch, 3pt. Pinch, supination, and wrist extension. Pt. Requires verbal cues, and visual demonstration.                OT  Education - 03/07/16 1424    Education provided Yes   Education Details Pink theraputty, and [facility]   Person(s) Educated Patient   Methods Explanation   Comprehension Verbalized understanding;Returned demonstration             OT Long Term Goals - 02/28/16 1830      OT LONG TERM GOAL #1   Title Pt. will improve left hand [facility] skills by 3 sec. to be able to handle change efficiently when shopping.   Baseline Right 25 sec., Left 36 sec. Pt. has difficulty   Time 12   Period Weeks   Status On-going     OT LONG TERM GOAL #2   Title Pt. will improve Left grip strength to be able to independently hold and pour from a pitcher.   Baseline Right: 60#, Left 45#   Time 12   Period Weeks   Status On-going     OT LONG TERM GOAL #3   Title Pt. will improve left pinch strength to be able to cut meat/veggies, and peel potatoes independently and efficiently.   Baseline Pt. has difficulty   Time 12   Period Weeks   Status On-going     OT LONG TERM GOAL #  4   Title Pt. will independently and efficiently button shirts, and pants.   Baseline Pt. has difficulty   Time 12   Period Weeks   Status On-going     OT LONG TERM GOAL #5   Title Pt. will be able to independently apply earrings   Baseline Pt. has difficulty   Time 12   Period Weeks   Status On-going               Plan - 03/07/16 1355    Clinical Impression Statement Pt. continues to improve with bilateral [facility] skills, however continues to present with incoordination when using them during ADL and IADL tasks. Pt. continues to requires verbal, and visual demonstration for proper form during pink theraputty exercises.   Rehab Potential Good   Clinical Impairments Affecting Rehab Potential Positive indicators: motivation, family support, age. Negative Indicators: multiple comorbidities.   OT Frequency 2x / week   OT Duration 2 weeks   OT Treatment/Interventions Self-care/ADL training;Therapeutic exercise;Patient/family  education;Therapeutic exercises;Energy conservation   Consulted and Agree with Plan of Care Patient      Patient will benefit from skilled therapeutic intervention in order to improve the following deficits and impairments:  Decreased strength, Impaired UE functional use, Decreased range of motion, Decreased knowledge of use of DME  Visit Diagnosis: Other lack of coordination  Muscle weakness (generalized)    Problem List Patient Active Problem List   Diagnosis Date Noted  . Cervical myelopathy with cervical radiculopathy 01/26/2016  . Lumbosacral radiculopathy 01/26/2016  . S/P cervical spinal fusion 01/26/2016    [name], MS, OTR/L 03/07/2016, 2:29 PM  [place] MAIN [facility] SERVICES [address] [place], [place], [address] Phone: [phone]   Fax:  [phone]  Name: Anna Chambers MRN: [id number] Date of Birth: Jan 13, 1941

## 2016-03-07 NOTE — Patient Instructions (Signed)
OT TREATMENT      Neuro mus  Pt. performed [facility] tasks using the Grooved pegboard. Pt. worked on grasping the grooved pegs from a horizontal position, and moving the pegs to a vertical position in the hand to prepare for placing them in the grooved slot.    Therapeutic Exercise:   Pt. worked on pink theraputty exercises for gross digit flexion, extension, thumb abduction, thumb opposition, lateral pinch, 2pt. Pinch, 3pt. Pinch, supination, and wrist extension. Pt. Requires verbal cues, and visual demonstration.   zSelfcare:  Manual Therapy:

## 2016-03-13 ENCOUNTER — Ambulatory Visit: Payer: Medicare Other | Admitting: Occupational Therapy

## 2016-03-13 ENCOUNTER — Ambulatory Visit: Payer: Medicare Other | Attending: Neurology

## 2016-03-13 DIAGNOSIS — R278 Other lack of coordination: Secondary | ICD-10-CM | POA: Diagnosis not present

## 2016-03-13 DIAGNOSIS — M6281 Muscle weakness (generalized): Secondary | ICD-10-CM

## 2016-03-13 DIAGNOSIS — R2681 Unsteadiness on feet: Secondary | ICD-10-CM | POA: Diagnosis present

## 2016-03-13 NOTE — Therapy (Signed)
[place] MAIN [facility] SERVICES [address] [place], [place], [address] Phone: [phone]   Fax:  [phone]  Physical Therapy Treatment  Patient Details  Name: Anna Chambers MRN: [id number] Date of Birth: 1940-10-20 Referring Provider: Alda Berthold  Encounter Date: 03/13/2016      PT End of Session - 03/13/16 1029    Visit Number 9   Number of Visits 17   Date for PT Re-Evaluation 2016-04-11   Authorization Type G codes   Authorization Time Period 9/10 G Code   PT Start Time [id number]   PT Stop Time 1100   PT Time Calculation (min) 45 min   Equipment Utilized During Treatment Gait belt   Activity Tolerance Patient tolerated treatment well   Behavior During Therapy [facility] for tasks assessed/performed      Past Medical History:  Diagnosis Date  . Cervical myelopathy with cervical radiculopathy   . Depression   . Hypertension   . Sleep apnea     Past Surgical History:  Procedure Laterality Date  . EYE SURGERY    . FOOT SURGERY Left   . SPINE SURGERY     ACDF C3-4  . VAGINAL HYSTERECTOMY      There were no vitals filed for this visit.      Subjective Assessment - 03/13/16 1019    Subjective Patient reports no significant changes since the previous visit, states she is happy it is not raining.   Pertinent History Pt presents today with LLE weakness and impaired sensation which she reports began back in 2014 following her ACDF C3-4.  She received PT at this clinic at the end of last year for ~6 weeks with some improvement.  Pt reports she feels week and has poor control of her LLE from her L knee down to her foot.  Pt not wearing AFO to PT evaluation but pt reports she wears it and uses SPC when ambulating longer distances.  Does not use AD or AFO at home.  Denies any falls in the past 6 months. Pt feels as though she drags her L foot when ambulating.  Pt reports she enjoys walking at the park, can walk ~400 yards before needing to rest.   Still driving and independent with bathing, cooking.  Pt's goal is to strengthen her LLE and to decrease numbness and tingling in Bil hands (will work mainly with OT on UEs).   Pt with h/o Bil numbness/tingling from shoulder down to fingertips L>R).   Pt still with difficulty putting on earrings, opening cans, and buttoning her shirt (OT notified).  Has her husband assist with these tasks. 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    How long can you walk comfortably? ~400 yards   Diagnostic tests NCS/EMG   Patient Stated Goals to be able to walk without dragging L foot; to strengthen LLE   Currently in Pain? No/denies   Pain Onset More than a month ago        TREATMENT  Ther-ex Nustep with seat position at 8 - 5 min resistance level: 6 Leg Press with focus on quadriceps activation - 2 x 15; 165# Left ankle dorsiflexion against Green theraband - 2 x 20    Neuromuscular Re-education   Heel/toe raises on airex pad -  2x 20  Heel walking - 4 x 8ft Toe walking - 2 x 58ft Ambulation with cueing on improving arm swing and with cueing on improving heel strike and Tibialis anterior activation - 2 x 30 ft Resisted walkouts at [facility] machine 3 laps forward, backward, laterally - 17.5# each direction Sit to stands with raising 4# ball over head - 2 x 10 Hip abduction with UE support - x15       PT Education - 03/13/16 1026    Education provided Yes   Education Details Education on joint positoining with exercise performance from therapy   Person(s) Educated Patient   Methods Explanation;Demonstration   Comprehension Verbalized understanding;Returned demonstration             PT Long Term Goals - 02/14/16 1022      PT LONG TERM GOAL #1   Title Pt will improve 5xSTS by at least 3  seconds to demonstrate improved BLE strenth and balance   Baseline 14.17 sec   Time 6   Period Weeks   Status New     PT LONG TERM GOAL #2   Title L DF and hip F strength will improve to 4/5 for decreased risk of falling   Baseline 3+/5 L hip F, 3/5 L DF   Time 8   Period Weeks   Status New     PT LONG TERM GOAL #3   Title Pt will improve Berg Balance Test score to at least 52/56 to demonstrate improved balance and decreased risk of falling   Baseline 45/56   Time 8   Period Weeks   Status New     PT LONG TERM GOAL #4   Title Pt will be able to ambulate in community without use of SPC to demonstrate improved balance and endurance.   Baseline SPC for community distances   Time 8   Period Weeks   Status New     PT LONG TERM GOAL #5   Title Pt will increase 6MWT by at least 51m (16ft) in order to demonstrate clinically significant improvement in cardiopulmonary endurance and community ambulation   Baseline 02/14/16   Time 8   Period Weeks   Status New               Plan - 03/13/16 1050    Clinical Impression Statement Conitnued to focus on improving balance with gait and improving hip/lumbar stabilization in standing to decrease fall risk. Patient demonstrates less postural sway with movement indicating functional carryover. Patient continues to require intermittent UE support with balance exercises and will benefit from further skilled therapy to return to prior level of function.    Rehab Potential Fair   Clinical Impairments Affecting Rehab Potential (- and +) already received PT for same symptoms with some improvement.  (-) chronicity of symptoms since 2014.  (+) pt very positive and motivated.   PT Frequency 2x / week   PT Duration 8 weeks   PT Treatment/Interventions ADLs/Self Care Home Management;Aquatic Therapy;Biofeedback;Electrical Stimulation;DME Instruction;Gait training;Stair training;Functional mobility training;Therapeutic activities;Therapeutic  exercise;Balance training;Neuromuscular re-education;Patient/family education;Orthotic Fit/Training;Manual techniques;Passive range of motion;Dry needling;Energy conservation;Taping;Visual/perceptual remediation/compensation   PT Next Visit Plan administer 56mWT; Review HEP and progress balance and strengthening exercises.   PT Home Exercise Plan Seated DF with RTB resist; Bridging, sit to stand without UE support, heel/toe rocking, semitandem/tandem balance   Consulted and Agree with Plan of Care Patient      Patient will benefit from skilled therapeutic intervention in order to improve  the following deficits and impairments:  Abnormal gait, Decreased activity tolerance, Decreased balance, Decreased endurance, Decreased knowledge of use of DME, Decreased range of motion, Decreased safety awareness, Decreased strength, Difficulty walking, Increased fascial restricitons, Impaired perceived functional ability, Impaired sensation, Impaired UE functional use, Impaired vision/preception, Improper body mechanics, Postural dysfunction  Visit Diagnosis: Other lack of coordination  Muscle weakness (generalized)  Unsteadiness on feet     Problem List Patient Active Problem List   Diagnosis Date Noted  . Cervical myelopathy with cervical radiculopathy 01/26/2016  . Lumbosacral radiculopathy 01/26/2016  . S/P cervical spinal fusion 01/26/2016    Blythe Stanford, PT DPT 03/13/2016, 11:00 AM  [place] MAIN [facility] SERVICES [address] [place], [place], [address] Phone: [phone]   Fax:  [phone]  Name: MATISON STONEBACK MRN: [id number] Date of Birth: 04/28/1940

## 2016-03-13 NOTE — Patient Instructions (Signed)
OT TREATMENT     Neuro muscular re-education:  Pt. worked on tasks to sustain lateral pinch on resistive tweezers while grasping and moving 2" toothpick sticks from a horizontal flat position to a vertical position in order to place it in the holder. Pt. was able to sustain grasp while positioning and extending the wrist/hand in the necessary alignment needed to place the stick through the top of the holder. Pt. Worked on grasping flipping, turning, and stacking Instructo board pegs. Pt. Worked on controlling hand and finger movements. Verbal cues, and visual demonstration was required. Pt. Worked on grasping 2" sticks and placing them in a board. Pt. Used resistive tweezers to remove the sticks.  Therapeutic Exercise:  Selfcare:  Manual Therapy:

## 2016-03-13 NOTE — Therapy (Signed)
[place] [facility] [address] [place], [place], [address] Phone: [phone]   Fax:  [phone]  Occupational Therapy Treatment  Patient Details  Name: Anna Chambers MRN: [id number] Date of Birth: 1940/10/23 Referring Provider: Dr. Benjamine Sprague  Encounter Date: 03/13/2016      OT End of Session - 03/13/16 1126    Visit Number 9   Number of Visits 24   Date for OT Re-Evaluation 05/01/16   Authorization Type Medicare G-code 9 of 10   Authorization - Number of Visits 24   OT Start Time 1117   Activity Tolerance Patient tolerated treatment well   Behavior During Therapy [facility] for tasks assessed/performed      Past Medical History:  Diagnosis Date  . Cervical myelopathy with cervical radiculopathy   . Depression   . Hypertension   . Sleep apnea     Past Surgical History:  Procedure Laterality Date  . EYE SURGERY    . FOOT SURGERY Left   . SPINE SURGERY     ACDF C3-4  . VAGINAL HYSTERECTOMY      There were no vitals filed for this visit.      Subjective Assessment - 03/13/16 1124    Subjective  Pt. reports her hips are sore after PT.   Pertinent History Pt. presents with numbness and tingling in bilateral hands. Pt. had a cervical Fusion in 2014. Numbness has improved  after surgery, and following therapy in 2016. Pt. has had a worsening of numbness and tingling, and now presents for skilled OT services secondary to numbness interfering with ADL tasks.   Patient Stated Goals I want to try and avoid surgery   Currently in Pain? No/denies                      OT Treatments/Exercises (OP) - 03/13/16 1158      Fine Motor Coordination   Other Fine Motor Exercises Pt. worked on tasks to sustain lateral pinch on resistive tweezers while grasping and moving 2" toothpick sticks from a horizontal flat position to a vertical position in order to place it in the holder. Pt. was able to sustain grasp while  positioning and extending the wrist/hand in the necessary alignment needed to place the stick through the top of the holder. Pt. Worked on grasping flipping, turning, and stacking Instructo board pegs. Pt. Worked on controlling hand and finger movements. Verbal cues, and visual demonstration was required. Pt. Worked on grasping 2" sticks and placing them in a board. Pt. Used resistive tweezers to remove the sticks.                OT Education - 03/13/16 1126    Education provided Yes   Education Details [facility] skills   Person(s) Educated Patient   Methods Explanation;Demonstration   Comprehension Verbalized understanding;Returned demonstration             OT Long Term Goals - 02/28/16 1830      OT LONG TERM GOAL #1   Title Pt. will improve left hand [facility] skills by 3 sec. to be able to handle change efficiently when shopping.   Baseline Right 25 sec., Left 36 sec. Pt. has difficulty   Time 12   Period Weeks   Status On-going     OT LONG TERM GOAL #2   Title Pt. will improve Left grip strength to be able to independently hold and pour from a pitcher.  Baseline Right: 60#, Left 45#   Time 12   Period Weeks   Status On-going     OT LONG TERM GOAL #3   Title Pt. will improve left pinch strength to be able to cut meat/veggies, and peel potatoes independently and efficiently.   Baseline Pt. has difficulty   Time 12   Period Weeks   Status On-going     OT LONG TERM GOAL #4   Title Pt. will independently and efficiently button shirts, and pants.   Baseline Pt. has difficulty   Time 12   Period Weeks   Status On-going     OT LONG TERM GOAL #5   Title Pt. will be able to independently apply earrings   Baseline Pt. has difficulty   Time 12   Period Weeks   Status On-going               Plan - 03/13/16 1127    Clinical Impression Statement Pt. continues to work on improving bilateral hand coordination skills, and bimanual hand coordination, speed, and  dexterity. Pt. Left hand fatigues with prolonged [place] use. Pt. continues to progress overall with UE functioning for use during self care tasks.   Rehab Potential Good   Clinical Impairments Affecting Rehab Potential Positive indicators: motivation, family support, age. Negative Indicators: multiple comorbidities.   OT Frequency 2x / week   OT Duration 2 weeks   OT Treatment/Interventions Self-care/ADL training;Therapeutic exercise;Patient/family education;Therapeutic exercises;Energy conservation   Consulted and Agree with Plan of Care Patient      Patient will benefit from skilled therapeutic intervention in order to improve the following deficits and impairments:  Decreased strength, Impaired UE functional use, Decreased range of motion, Decreased knowledge of use of DME  Visit Diagnosis: Muscle weakness (generalized)  Other lack of coordination    Problem List Patient Active Problem List   Diagnosis Date Noted  . Cervical myelopathy with cervical radiculopathy 01/26/2016  . Lumbosacral radiculopathy 01/26/2016  . S/P cervical spinal fusion 01/26/2016    [name], MS, OTR/L 03/13/2016, 1:18 PM  [place] [facility] SERVICES [address] [place], [place], [address] Phone: [phone]   Fax:  [phone]  Name: Anna Chambers MRN: [id number] Date of Birth: [birth year]

## 2016-03-15 ENCOUNTER — Encounter: Payer: Medicare Other | Admitting: Occupational Therapy

## 2016-03-16 ENCOUNTER — Ambulatory Visit: Payer: Medicare Other

## 2016-03-16 ENCOUNTER — Ambulatory Visit: Payer: Medicare Other | Admitting: Occupational Therapy

## 2016-03-20 ENCOUNTER — Ambulatory Visit: Payer: Medicare Other | Admitting: Occupational Therapy

## 2016-03-20 ENCOUNTER — Ambulatory Visit: Payer: Medicare Other

## 2016-03-22 ENCOUNTER — Ambulatory Visit: Payer: Medicare Other

## 2016-03-26 ENCOUNTER — Ambulatory Visit: Payer: Medicare Other

## 2016-03-27 ENCOUNTER — Encounter: Payer: Medicare Other | Admitting: Occupational Therapy

## 2016-03-29 ENCOUNTER — Encounter: Payer: Medicare Other | Admitting: Occupational Therapy

## 2016-04-03 ENCOUNTER — Encounter: Payer: Medicare Other | Admitting: Occupational Therapy

## 2016-04-05 ENCOUNTER — Encounter: Payer: Medicare Other | Admitting: Occupational Therapy

## 2016-04-11 ENCOUNTER — Ambulatory Visit
Admission: RE | Admit: 2016-04-11 | Discharge: 2016-04-11 | Disposition: A | Discharge status: Home / Self Care | Payer: Medicare Other | Source: Ambulatory Visit | Attending: Internal Medicine | Admitting: Internal Medicine

## 2016-04-11 DIAGNOSIS — Z1231 Encounter for screening mammogram for malignant neoplasm of breast: Secondary | ICD-10-CM | POA: Diagnosis present

## 2016-05-23 ENCOUNTER — Ambulatory Visit (INDEPENDENT_AMBULATORY_CARE_PROVIDER_SITE_OTHER): Payer: Medicare Other | Admitting: Neurology

## 2016-05-23 ENCOUNTER — Encounter: Payer: Self-pay | Admitting: Neurology

## 2016-05-23 VITALS — BP 124/70 | HR 68 | Ht 64.0 in | Wt 178.6 lb

## 2016-05-23 DIAGNOSIS — M5412 Radiculopathy, cervical region: Secondary | ICD-10-CM

## 2016-05-23 DIAGNOSIS — M5417 Radiculopathy, lumbosacral region: Secondary | ICD-10-CM | POA: Diagnosis not present

## 2016-05-23 DIAGNOSIS — M4712 Other spondylosis with myelopathy, cervical region: Secondary | ICD-10-CM

## 2016-05-23 DIAGNOSIS — G959 Disease of spinal cord, unspecified: Secondary | ICD-10-CM

## 2016-05-23 DIAGNOSIS — Z981 Arthrodesis status: Secondary | ICD-10-CM | POA: Diagnosis not present

## 2016-05-23 NOTE — Progress Notes (Signed)
[facility] Follow-up Visit   Date: 05/23/16   History of Present Illness: Anna Chambers is a 76 y.o. right-handed African American female with cervical myelopathy s/p ACDF at C3-C4 (2014), gout, hypertension, and glaucoma returning for evaluation of bilateral hand paresthesias.    In 2014, she began having numbness and tingling over the fingers bilaterally.  She feels that her hands feel rough, like sandpaper.  She had cervical decompression and fusion which helped with her neck pain, but no benefit with hand paresthesias. Symptoms have not worsened since onset, but annoy her because they are persistent.  She denies any hand weakness.  She takes gabapentin 300mg  BID with no improvement.  She also complains of left foot drop which started around the same time.  NCS/EMG of the legs showed L5-S1 radiculopathy and axonal peripheral neuropathy.  MRI lumbar spine did show significant disc disease at L4-L5, L5-S1 specifically left L5 nerve root per radiologist was "swollen", as per Dr. Trena Platt clinic note. She completed PT and did not have any improvement.  She walks with a left AFO and walks with a cane for long distances.  She has not had any falls.  There is no history of diabetes, alcoholism, or thyroid disease.   Starting around December 2016, she began having myalgias of the upper arms bilaterally and has difficulty raising her arms above her shoulders.  She endorses tenderness of the muscles.  No significant improvement with PT.  She has been under the case of Dr. Jennings Books and is here for a second opinion.  Overall, she denies any worsening or new symptoms and states that her hand paresthesias and left foot drop have not improved since onset.   UPDATE 01/26/2016:  Patient was last seen in the clinic in February 2017.  Today, she presents with the ongoing issues of numbness involving both hands.  Symptoms have been persistence since 2014, before she had cervical surgery  and have not improved.  She difficulty with fine motor tasks, specifically feeling objects.  There is no weakness.  She continues to have constant numbness of the left dorsum of the foot and first three toes.  She does not have foot drop any more, but sometimes will notice dragging of the left foot, especially with prolonged walking.  She denies any neck or low back pain.  Electrodiagnostic testing performed in June of the upper and lower extremities shows left C7-8 and left L5 radiculopathy.  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   UPDATE 05/23/2016:  She is here for 32-month follow-up visit.  At her last visit, physical therapy was recommended for cervical and lumbosacral radiculopathy, however she denies any improvement.  She continues to have constant prickly sensation of the finger tips bilaterally and left foot numbness.  She has also noticed increased dragging of the left foot.  Fortunately, she has not had any falls.   Medications:  Outpatient Encounter Prescriptions as of 05/23/2016  Medication Sig Note  . aspirin 81 MG tablet Take 81 mg by mouth daily.   . B Complex Vitamins (VITAMIN-B COMPLEX) TABS Take by mouth. 01/26/2016: Received from: [place]: Take 1 tablet by mouth once daily.  . bisoprolol-hydrochlorothiazide (ZIAC) 2.5-6.25 MG per tablet Take 1 tablet by mouth daily.   . brimonidine-timolol (COMBIGAN) 0.2-0.5 % ophthalmic solution Administer 10 drops to both eyes Two (2) times a day. Frequency:PHARMDIR  Dosage:41ml     Instructions:  Note:Instill one drop in each eye twice daily. Dose: 1 01/26/2016: Received from: [facility]  . brinzolamide (AZOPT) 1 % ophthalmic suspension Administer 1 drop to both eyes Two (2) times a day (at 8am and 5pm). Frequency:PHARMDIR   Dosage:0.0     Instructions:  Note:Instill one drop in each eye twice a day. Dose: 1  01/26/2016: Received from: [facility]  . Calcium Carb-Cholecalciferol 600-800 MG-UNIT TABS Take 600 mg by mouth. 01/26/2016: Received from: [place]: Take 600 mg by mouth daily. Frequency:QD   Dosage:0.0     Instructions:  Note:Dose: 600 MG-400  . colchicine 0.6 MG tablet Take 0.6 mg by mouth daily.   . DOCOSAHEXAENOIC ACID PO Take by mouth. 01/26/2016: Received from: [place]: Take 2 g by mouth daily.  Marland Kitchen gabapentin (NEURONTIN) 300 MG capsule Take 300 mg by mouth 2 (two) times daily.   Marland Kitchen latanoprost (XALATAN) 0.005 % ophthalmic solution Administer 2.5 mL to both eyes nightly. Frequency:QD   Dosage:0.0     Instructions:  Note:Dose: 0.005 % 01/26/2016: Received from: [facility]  . meloxicam (MOBIC) 15 MG tablet Take 15 mg by mouth daily.   Marland Kitchen oxybutynin (DITROPAN-XL) 5 MG 24 hr tablet TK 1 T PO  QD 04/04/2015: Received from: External Pharmacy  . pilocarpine (PILOCAR) 2 % ophthalmic solution 1 drop 4 (four) times daily.   Marland Kitchen ULORIC 40 MG tablet TK 1 T PO ONCE D 04/04/2015: Received from: External Pharmacy   No facility-administered encounter medications on file as of 05/23/2016.      Allergies:  Allergies  Allergen Reactions  . Simvastatin Rash    Review of Systems:  CONSTITUTIONAL: No fevers, chills, night sweats, or weight loss.   EYES: No visual changes or eye pain ENT: No hearing changes.  No history of nose bleeds.   RESPIRATORY: No cough, wheezing and shortness of breath.   CARDIOVASCULAR: Negative for chest pain, and palpitations.   GI: Negative for abdominal discomfort, blood in stools or black stools.  No recent change in bowel habits.   GU:  No history of incontinence.   MUSCLOSKELETAL: No history of joint pain or swelling.  + myalgias.   SKIN: Negative for lesions, rash, and itching.   HEMATOLOGY/ONCOLOGY: Negative for prolonged bleeding, bruising easily, and swollen nodes.  No history of cancer.   ENDOCRINE: Negative for cold or  heat intolerance, polydipsia or goiter.   PSYCH:  No depression or anxiety symptoms.   NEURO: As Above.   Vital Signs:  BP 124/70   Pulse 68   Ht 5\' 4"  (1.626 m)   Wt 178 lb 9 oz (81 kg)   LMP  (LMP Unknown)   SpO2 97%   BMI 30.65 kg/m   Neurological Exam: MENTAL STATUS including orientation to time, place, person, recent and remote memory, attention span and concentration, language, and fund of knowledge is normal.  Speech is not dysarthric.  CRANIAL NERVES: Pupils equal round and reactive to light.  Normal conjugate, extra-ocular eye movements in all directions of gaze.  No nystagmus.  Right ptosis (mild).  No facial weakness.   MOTOR:  Motor strength is 5/5 throughout except 4/5 left foot dorsiflexion and toe extension.  Tone is increased in the legs 0+.    MSRs:  Right  Left brachioradialis 3+  brachioradialis 3+  biceps 3+  biceps 3+  triceps 3+  triceps 3+  patellar 3+  patellar 3+  ankle jerk 2+  ankle jerk 2+  Hoffman no  Hoffman no  plantar response down  plantar response down   SENSORY: Reduced pin prick and temperature over the left dorsum of the foot.  Vibration is reduced at the great toe bilaterally. Sensation reduced to temperature over the fingertips bilaterally.  COORDINATION/GAIT:.  Gait appears unsteady with mild dragging of the left foot.  DATA: NCS/EMG of the upper extremities 12/13/2014:   This is an abnormal electrodiagnostic study consistent with minimal right ulnar neuropathy site unspecified.  NCS/EMG of the lower extremities 12/07/2013:  This is an abnormal electrodiagnostic exam consistent with 1) L5,S1 radiculopathy on the left. 2) a generalized Peripheral polyneuropathy.   MRI cervical spine 03/25/2012:  Multilevel cervical spinal degenerative disease as described above. Severe spinal canal stenosis at C3-4 causing abnormal signal in the cord suggesting myelopathy.   NCS/EMG of the  upper extremities 07/19/2015:   1. 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 2. There is no evidence of carpal tunnel syndrome affecting the upper extremities.  NCS/EMG of the legs 07/12/2015:   1.  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 Absent left superficial peroneal sensory response is most likely due to a proximal location of the dorsal root ganglion which can occur at this level. 2.  There is no evidence of a sensorimotor polyneuropathy or peroneal mononeuropathy.  MRI lumbar spine wo contrast 07/23/2015: No marked change in the appearance of the lumbar spine.  Spondylosis appears worst at L2-3 where there is moderate central canal stenosis with narrowing in the right lateral recess and foramen which could impact the exiting right L2 and/or descending right L3 roots. Moderate left foraminal narrowing is also identified at this level.  Prominent disc bulge extending from the left lateral recess into the left paravertebral space at L5-S1 could irritate the exiting and exited left L5 root. Disc contacts the descending left S1 root without compression or displacement.  IMPRESSION: 1.  Cervical myelopathy s/p ACFD at C3-4 (2014) with residual bilateral hand paresthesias. I explained that the persistent numbness of the hands is most likely permanent neurological deficits from her initial cord compression.   Because she has brisk reflexes and mild spasticity of the legs, I offered to repeat MRI cervical spine to look for structural pathology.  Her NCS/EMG of the upper extremities did not show neuropathy or CTS.  There is mild left C7-8 radiculopathy.    2.  Left foot paresthesias most likely due to L5 radiculopathy which is seen on EDX and imaging.  There is mild weakness of left toe extension and dorsiflexion and did see benefit with physical therapy, so will refer her to neurosurgery for  evaluation.  Return to clinic after testing   The duration of this appointment visit was 25 minutes of face-to-face time with the patient.  Greater than 50% of this time was spent in counseling, explanation of diagnosis, planning of further management, and coordination of care.   Thank you for allowing me to participate in patient's care.  If I can answer any additional questions, I would be pleased to do so.    Sincerely,    Niranjan Rufener K. Posey Pronto, DO

## 2016-05-23 NOTE — Patient Instructions (Signed)
1.  MRI cervical spine wo contrast 2.  Based on the results of your MRI

## 2016-05-23 NOTE — Progress Notes (Signed)
Referral sent 

## 2016-05-24 NOTE — Progress Notes (Signed)
[place] neuro cancelled.   Referral sent to Dr. Lynann Bologna.

## 2016-06-05 ENCOUNTER — Ambulatory Visit (INDEPENDENT_AMBULATORY_CARE_PROVIDER_SITE_OTHER): Payer: Medicare Other

## 2016-06-05 ENCOUNTER — Ambulatory Visit (INDEPENDENT_AMBULATORY_CARE_PROVIDER_SITE_OTHER): Payer: Medicare Other | Admitting: Podiatry

## 2016-06-05 DIAGNOSIS — M25572 Pain in left ankle and joints of left foot: Secondary | ICD-10-CM | POA: Diagnosis not present

## 2016-06-05 DIAGNOSIS — M79672 Pain in left foot: Secondary | ICD-10-CM

## 2016-06-05 DIAGNOSIS — M659 Synovitis and tenosynovitis, unspecified: Secondary | ICD-10-CM

## 2016-06-05 DIAGNOSIS — M7752 Other enthesopathy of left foot: Secondary | ICD-10-CM

## 2016-06-05 DIAGNOSIS — M779 Enthesopathy, unspecified: Secondary | ICD-10-CM

## 2016-06-05 DIAGNOSIS — M778 Other enthesopathies, not elsewhere classified: Secondary | ICD-10-CM

## 2016-06-06 NOTE — Progress Notes (Signed)
   Subjective:  Patient presents today for gradual onset of stabbing pain and tenderness to the left foot and ankle that has been present for the last two years. She reports associated numbness in the left great toe. She states her symptoms are getting progressively worse and she is now experiencing stiffness and swelling of the left ankle. Patient presents for further treatment and evaluation.  Objective / Physical Exam:  General:  The patient is alert and oriented x3 in no acute distress. Dermatology:  Skin is warm, dry and supple bilateral lower extremities. Negative for open lesions or macerations. Vascular:  Palpable pedal pulses bilaterally. No edema or erythema noted. Capillary refill within normal limits. Neurological:  Epicritic and protective threshold grossly intact bilaterally.  Musculoskeletal Exam:  Pain on palpation to the anterior lateral medial aspects of the patient's left ankle and left midfoot. Mild edema noted.  Range of motion within normal limits to all pedal and ankle joints bilateral. Muscle strength 5/5 in all groups bilateral.   Radiographic Exam:  Normal osseous mineralization. Joint spaces preserved. No fracture/dislocation/boney destruction.    Assessment: #1 pain in left ankle #2 synovitis of left ankle #3 capsulitis of left midfoot  Plan of Care:  #1 Patient was evaluated. #2 injection of 0.5 mL Celestone Soluspan injected in the patient's left ankle. #3 injection of 0.5 mL Celestone Soluspan injected in the patient's left midfoot/[place] franc joint #4 compression anklet dispensed #5 patient is to return to clinic in 3 months   Edrick Kins, DPM [facility]  Dr. Edrick Kins, [address]                                        [place], [place] [address]                Office [phone]  Fax [phone]

## 2016-06-07 MED ORDER — BETAMETHASONE SOD PHOS & ACET 6 (3-3) MG/ML IJ SUSP: 3.0000 mg | Freq: Once | INTRAMUSCULAR | Status: DC

## 2016-07-05 ENCOUNTER — Encounter: Payer: Self-pay | Admitting: Podiatry

## 2016-07-05 ENCOUNTER — Ambulatory Visit (INDEPENDENT_AMBULATORY_CARE_PROVIDER_SITE_OTHER): Payer: Medicare Other | Admitting: Podiatry

## 2016-07-05 ENCOUNTER — Ambulatory Visit (INDEPENDENT_AMBULATORY_CARE_PROVIDER_SITE_OTHER): Payer: Medicare Other

## 2016-07-05 DIAGNOSIS — R52 Pain, unspecified: Secondary | ICD-10-CM

## 2016-07-05 DIAGNOSIS — M79673 Pain in unspecified foot: Secondary | ICD-10-CM

## 2016-07-05 DIAGNOSIS — M25572 Pain in left ankle and joints of left foot: Secondary | ICD-10-CM

## 2016-07-05 MED ORDER — MELOXICAM 15 MG PO TABS: 15.0000 mg | ORAL_TABLET | Freq: Every day | ORAL | 0 refills | Status: DC

## 2016-07-05 NOTE — Progress Notes (Signed)
This patient presents the office with chief complaint of pain that has started on the top of her left foot. She says that she was evaluated and treated by Dr. Amalia Hailey for capsulitis of her left midfoot. He treated her with injection therapy and an anklet. She says the pain started to return this past Monday and has worsened through the week. She presents the office for continued evaluation and treatment of her painful left foot.  GENERAL APPEARANCE: Alert, conversant. Appropriately groomed. No acute distress.  VASCULAR: Pedal pulses are  palpable at  [facility] and PT bilateral.  Capillary refill time is immediate to all digits,  Normal temperature gradient.   NEUROLOGIC: sensation is normal to 5.07 monofilament at 5/5 sites bilateral.  Light touch is intact bilateral, Muscle strength normal.  MUSCULOSKELETAL: acceptable muscle strength, tone and stability bilateral.  Intrinsic muscluature intact bilateral.  Rectus appearance of foot and digits noted bilateral.  Bony exostosis left midfoot.  No evidence of redness or swelling or increased pain.  DERMATOLOGIC: skin color, texture, and turgor are within normal limits.  No preulcerative lesions or ulcers  are seen, no interdigital maceration noted.  No open lesions present.  Digital nails are asymptomatic. No drainage noted.  Diagnosis  Capsulitis left foot  DJD  Liz-Frank left foot.  Treatment  IE  Discussed condition with this patient.  Told patient she is experiencing degenerative joint changes on the top of her foot. This is indicated of of osteoarthritis. She was prescribed Mobic to be taken daily for the pain.  We also discussed treatment with power step insoles and better footwear.  RTC prn.  I did offer her a second injection and at this time. She did not want injection   Gardiner Barefoot DPM

## 2016-07-30 ENCOUNTER — Ambulatory Visit: Payer: Medicare Other | Admitting: Podiatry

## 2016-08-01 ENCOUNTER — Other Ambulatory Visit: Payer: Self-pay | Admitting: Podiatry

## 2016-09-07 ENCOUNTER — Ambulatory Visit: Payer: Medicare Other | Admitting: Podiatry

## 2017-01-10 ENCOUNTER — Ambulatory Visit (INDEPENDENT_AMBULATORY_CARE_PROVIDER_SITE_OTHER): Payer: Medicare Other | Admitting: Podiatry

## 2017-01-10 ENCOUNTER — Encounter: Payer: Self-pay | Admitting: Podiatry

## 2017-01-10 DIAGNOSIS — L603 Nail dystrophy: Secondary | ICD-10-CM

## 2017-01-10 NOTE — Progress Notes (Signed)
This patient presents the office with chief complaiof a painful big toenail on her left big toe.  She says that the nail is painful walking and wearing her shoes.  She does not remember injuring or traumatizing her toenail.  The nail is not attached to the nailbed at this time.  She presents the office for an evaluation and treatment of this painful toenail, left foot   Complaint:  Visit Type: Patient returns to my office for continued preventative foot care services. Complaint: Patient states" my nails have grown long and thick and become painful to walk and wear shoes" Patient has been diagnosed with DM with no foot complications. The patient presents for preventative foot care services. No changes to ROS  Podiatric Exam: Vascular: dorsalis pedis and posterior tibial pulses are palpable bilateral. Capillary return is immediate. Temperature gradient is WNL. Skin turgor WNL  Sensorium: Normal Semmes Weinstein monofilament test. Normal tactile sensation bilaterally. Nail Exam: Pt has thick disfigured discolored nails with subungual debris noted bilateral entire nail hallux through fifth toenails Ulcer Exam: There is no evidence of ulcer or pre-ulcerative changes or infection. Orthopedic Exam: Muscle tone and strength are WNL. No limitations in general ROM. No crepitus or effusions noted. Foot type and digits show no abnormalities. DJD midfoot left. Skin: No Porokeratosis. No infection or ulcers  Diagnosis:  Onychomycosis, ,, pain in left toes,  Nail dystrophy left hallux  Treatment & Plan Procedures and Treatment: Consent by patient was obtained for treatment procedures.  Removal nail plate left hallux.  Return Visit-Office Procedure: Patient instructed to return to the office for a follow up visit 3 months for treatment as needed.    Gardiner Barefoot DPM

## 2017-02-25 ENCOUNTER — Encounter (HOSPITAL_COMMUNITY): Payer: Self-pay

## 2017-02-25 ENCOUNTER — Observation Stay (HOSPITAL_COMMUNITY)
Admission: EM | Admit: 2017-02-25 | Discharge: 2017-02-27 | Disposition: A | Discharge status: Home / Self Care | Payer: Medicare Other | Attending: Family Medicine | Admitting: Family Medicine

## 2017-02-25 ENCOUNTER — Emergency Department (HOSPITAL_COMMUNITY): Payer: Medicare Other

## 2017-02-25 ENCOUNTER — Observation Stay (HOSPITAL_COMMUNITY): Payer: Medicare Other

## 2017-02-25 ENCOUNTER — Other Ambulatory Visit: Payer: Self-pay

## 2017-02-25 DIAGNOSIS — M21372 Foot drop, left foot: Secondary | ICD-10-CM | POA: Insufficient documentation

## 2017-02-25 DIAGNOSIS — R569 Unspecified convulsions: Secondary | ICD-10-CM

## 2017-02-25 DIAGNOSIS — N3281 Overactive bladder: Secondary | ICD-10-CM | POA: Diagnosis not present

## 2017-02-25 DIAGNOSIS — M5412 Radiculopathy, cervical region: Secondary | ICD-10-CM | POA: Diagnosis not present

## 2017-02-25 DIAGNOSIS — Z791 Long term (current) use of non-steroidal anti-inflammatories (NSAID): Secondary | ICD-10-CM | POA: Insufficient documentation

## 2017-02-25 DIAGNOSIS — M5417 Radiculopathy, lumbosacral region: Secondary | ICD-10-CM | POA: Insufficient documentation

## 2017-02-25 DIAGNOSIS — Z6831 Body mass index (BMI) 31.0-31.9, adult: Secondary | ICD-10-CM | POA: Insufficient documentation

## 2017-02-25 DIAGNOSIS — R55 Syncope and collapse: Principal | ICD-10-CM

## 2017-02-25 DIAGNOSIS — G959 Disease of spinal cord, unspecified: Secondary | ICD-10-CM | POA: Diagnosis not present

## 2017-02-25 DIAGNOSIS — G459 Transient cerebral ischemic attack, unspecified: Secondary | ICD-10-CM | POA: Diagnosis not present

## 2017-02-25 DIAGNOSIS — Z79899 Other long term (current) drug therapy: Secondary | ICD-10-CM | POA: Insufficient documentation

## 2017-02-25 DIAGNOSIS — Y9259 Other trade areas as the place of occurrence of the external cause: Secondary | ICD-10-CM | POA: Diagnosis not present

## 2017-02-25 DIAGNOSIS — E669 Obesity, unspecified: Secondary | ICD-10-CM | POA: Diagnosis not present

## 2017-02-25 DIAGNOSIS — H409 Unspecified glaucoma: Secondary | ICD-10-CM | POA: Diagnosis not present

## 2017-02-25 DIAGNOSIS — G629 Polyneuropathy, unspecified: Secondary | ICD-10-CM | POA: Insufficient documentation

## 2017-02-25 DIAGNOSIS — I119 Hypertensive heart disease without heart failure: Secondary | ICD-10-CM | POA: Insufficient documentation

## 2017-02-25 DIAGNOSIS — Z981 Arthrodesis status: Secondary | ICD-10-CM | POA: Diagnosis not present

## 2017-02-25 DIAGNOSIS — M5416 Radiculopathy, lumbar region: Secondary | ICD-10-CM | POA: Insufficient documentation

## 2017-02-25 DIAGNOSIS — Z7982 Long term (current) use of aspirin: Secondary | ICD-10-CM | POA: Diagnosis not present

## 2017-02-25 DIAGNOSIS — R531 Weakness: Secondary | ICD-10-CM | POA: Diagnosis present

## 2017-02-25 DIAGNOSIS — Z8249 Family history of ischemic heart disease and other diseases of the circulatory system: Secondary | ICD-10-CM | POA: Insufficient documentation

## 2017-02-25 HISTORY — DX: Syncope and collapse: R55

## 2017-02-25 HISTORY — DX: Unspecified convulsions: R56.9

## 2017-02-25 LAB — DIFFERENTIAL
Basophils Absolute: 0 10*3/uL (ref 0.0–0.1)
Basophils Relative: 0 %
Eosinophils Absolute: 0.2 10*3/uL (ref 0.0–0.7)
Eosinophils Relative: 2 %
Lymphocytes Relative: 31 %
Lymphs Abs: 3.2 10*3/uL (ref 0.7–4.0)
Monocytes Absolute: 0.6 10*3/uL (ref 0.1–1.0)
Monocytes Relative: 6 %
Neutro Abs: 6.2 10*3/uL (ref 1.7–7.7)
Neutrophils Relative %: 61 %

## 2017-02-25 LAB — PROTIME-INR
INR: 0.94
Prothrombin Time: 12.4 seconds (ref 11.4–15.2)

## 2017-02-25 LAB — I-STAT CHEM 8, ED
BUN: 19 mg/dL (ref 6–20)
Calcium, Ion: 1.14 mmol/L — ABNORMAL LOW (ref 1.15–1.40)
Chloride: 107 mmol/L (ref 101–111)
Creatinine, Ser: 1 mg/dL (ref 0.44–1.00)
Glucose, Bld: 83 mg/dL (ref 65–99)
HCT: 42 % (ref 36.0–46.0)
Hemoglobin: 14.3 g/dL (ref 12.0–15.0)
Potassium: 3.8 mmol/L (ref 3.5–5.1)
Sodium: 142 mmol/L (ref 135–145)
TCO2: 22 mmol/L (ref 22–32)

## 2017-02-25 LAB — URINALYSIS, ROUTINE W REFLEX MICROSCOPIC
Bilirubin Urine: NEGATIVE
Glucose, UA: NEGATIVE mg/dL
Hgb urine dipstick: NEGATIVE
Ketones, ur: NEGATIVE mg/dL
Nitrite: NEGATIVE
Protein, ur: NEGATIVE mg/dL
Specific Gravity, Urine: 1.009 (ref 1.005–1.030)
pH: 6 (ref 5.0–8.0)

## 2017-02-25 LAB — COMPREHENSIVE METABOLIC PANEL
ALT: 21 U/L (ref 14–54)
AST: 23 U/L (ref 15–41)
Albumin: 3.5 g/dL (ref 3.5–5.0)
Alkaline Phosphatase: 66 U/L (ref 38–126)
Anion gap: 11 (ref 5–15)
BUN: 17 mg/dL (ref 6–20)
CO2: 22 mmol/L (ref 22–32)
Calcium: 8.9 mg/dL (ref 8.9–10.3)
Chloride: 107 mmol/L (ref 101–111)
Creatinine, Ser: 1.1 mg/dL — ABNORMAL HIGH (ref 0.44–1.00)
GFR calc Af Amer: 55 mL/min — ABNORMAL LOW (ref >60)
GFR calc non Af Amer: 48 mL/min — ABNORMAL LOW (ref >60)
Glucose, Bld: 87 mg/dL (ref 65–99)
Potassium: 3.8 mmol/L (ref 3.5–5.1)
Sodium: 140 mmol/L (ref 135–145)
Total Bilirubin: 0.8 mg/dL (ref 0.3–1.2)
Total Protein: 6.4 g/dL — ABNORMAL LOW (ref 6.5–8.1)

## 2017-02-25 LAB — CBG MONITORING, ED: Glucose-Capillary: 76 mg/dL (ref 65–99)

## 2017-02-25 LAB — RAPID URINE DRUG SCREEN, HOSP PERFORMED
Amphetamines: NOT DETECTED
Barbiturates: NOT DETECTED
Benzodiazepines: NOT DETECTED
Cocaine: NOT DETECTED
Opiates: NOT DETECTED
Tetrahydrocannabinol: NOT DETECTED

## 2017-02-25 LAB — CBC
HCT: 40.8 % (ref 36.0–46.0)
Hemoglobin: 13.4 g/dL (ref 12.0–15.0)
MCH: 27.9 pg (ref 26.0–34.0)
MCHC: 32.8 g/dL (ref 30.0–36.0)
MCV: 85 fL (ref 78.0–100.0)
Platelets: 220 10*3/uL (ref 150–400)
RBC: 4.8 MIL/uL (ref 3.87–5.11)
RDW: 16.2 % — ABNORMAL HIGH (ref 11.5–15.5)
WBC: 10.2 10*3/uL (ref 4.0–10.5)

## 2017-02-25 LAB — TSH: TSH: 1.961 u[IU]/mL (ref 0.350–4.500)

## 2017-02-25 LAB — I-STAT TROPONIN, ED: Troponin i, poc: 0.01 ng/mL (ref 0.00–0.08)

## 2017-02-25 LAB — APTT: aPTT: 27 seconds (ref 24–36)

## 2017-02-25 LAB — ETHANOL: Alcohol, Ethyl (B): <10 mg/dL (ref <10)

## 2017-02-25 MED ORDER — BRINZOLAMIDE 1 % OP SUSP
1.0000 [drp] | Freq: Two times a day (BID) | OPHTHALMIC | Status: DC
  Administered 2017-02-25 – 2017-02-27 (×4): 1 [drp] via OPHTHALMIC
  Filled 2017-02-25: qty 10

## 2017-02-25 MED ORDER — MELOXICAM 7.5 MG PO TABS
15.0000 mg | ORAL_TABLET | Freq: Every day | ORAL | Status: DC
  Administered 2017-02-25 – 2017-02-27 (×3): 15 mg via ORAL
  Filled 2017-02-25 (×3): qty 2

## 2017-02-25 MED ORDER — BISOPROLOL-HYDROCHLOROTHIAZIDE 2.5-6.25 MG PO TABS
1.0000 | ORAL_TABLET | Freq: Every day | ORAL | Status: DC
  Administered 2017-02-26 – 2017-02-27 (×2): 1 via ORAL
  Filled 2017-02-25 (×2): qty 1

## 2017-02-25 MED ORDER — GABAPENTIN 300 MG PO CAPS
300.0000 mg | ORAL_CAPSULE | Freq: Two times a day (BID) | ORAL | Status: DC
  Administered 2017-02-25: 300 mg via ORAL
  Filled 2017-02-25 (×2): qty 1

## 2017-02-25 MED ORDER — PILOCARPINE HCL 2 % OP SOLN
1.0000 [drp] | Freq: Four times a day (QID) | OPHTHALMIC | Status: DC
  Administered 2017-02-25 – 2017-02-27 (×4): 1 [drp] via OPHTHALMIC
  Filled 2017-02-25: qty 15

## 2017-02-25 MED ORDER — ALLOPURINOL 300 MG PO TABS
300.0000 mg | ORAL_TABLET | Freq: Every day | ORAL | Status: DC
  Administered 2017-02-25 – 2017-02-27 (×3): 300 mg via ORAL
  Filled 2017-02-25 (×3): qty 1

## 2017-02-25 MED ORDER — PREDNISOLONE ACETATE 1 % OP SUSP
1.0000 [drp] | Freq: Four times a day (QID) | OPHTHALMIC | Status: DC
  Administered 2017-02-25 – 2017-02-27 (×4): 1 [drp] via OPHTHALMIC
  Filled 2017-02-25: qty 1

## 2017-02-25 MED ORDER — OXYBUTYNIN CHLORIDE ER 5 MG PO TB24
5.0000 mg | ORAL_TABLET | Freq: Every day | ORAL | Status: DC
  Administered 2017-02-25 – 2017-02-26 (×2): 5 mg via ORAL
  Filled 2017-02-25 (×3): qty 1

## 2017-02-25 MED ORDER — COLCHICINE 0.6 MG PO TABS
0.6000 mg | ORAL_TABLET | Freq: Every day | ORAL | Status: DC
  Administered 2017-02-25 – 2017-02-27 (×3): 0.6 mg via ORAL
  Filled 2017-02-25 (×3): qty 1

## 2017-02-25 MED ORDER — CALCIUM CARBONATE-VITAMIN D 500-200 MG-UNIT PO TABS
1.0000 | ORAL_TABLET | Freq: Every day | ORAL | Status: DC
  Administered 2017-02-25 – 2017-02-27 (×3): 1 via ORAL
  Filled 2017-02-25 (×4): qty 1

## 2017-02-25 MED ORDER — ASPIRIN EC 81 MG PO TBEC
81.0000 mg | DELAYED_RELEASE_TABLET | Freq: Every day | ORAL | Status: DC
  Administered 2017-02-25 – 2017-02-27 (×3): 81 mg via ORAL
  Filled 2017-02-25 (×3): qty 1

## 2017-02-25 MED ORDER — LATANOPROST 0.005 % OP SOLN
1.0000 [drp] | Freq: Every day | OPHTHALMIC | Status: DC
  Administered 2017-02-25 – 2017-02-26 (×2): 1 [drp] via OPHTHALMIC
  Filled 2017-02-25 (×2): qty 2.5

## 2017-02-25 MED ORDER — ENOXAPARIN SODIUM 40 MG/0.4ML ~~LOC~~ SOLN
40.0000 mg | SUBCUTANEOUS | Status: DC
  Administered 2017-02-25 – 2017-02-26 (×2): 40 mg via SUBCUTANEOUS
  Filled 2017-02-25 (×2): qty 0.4

## 2017-02-25 NOTE — ED Notes (Signed)
Pt in MRI, will come to F03 when returned.
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
## 2017-02-25 NOTE — ED Notes (Addendum)
Pt taken to MRI  
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
## 2017-02-26 ENCOUNTER — Observation Stay (HOSPITAL_COMMUNITY): Payer: Medicare Other

## 2017-02-26 ENCOUNTER — Ambulatory Visit (HOSPITAL_BASED_OUTPATIENT_CLINIC_OR_DEPARTMENT_OTHER): Payer: Medicare Other

## 2017-02-26 ENCOUNTER — Encounter (HOSPITAL_COMMUNITY): Payer: Self-pay | Admitting: Radiology

## 2017-02-26 ENCOUNTER — Other Ambulatory Visit: Payer: Self-pay | Admitting: Physician Assistant

## 2017-02-26 DIAGNOSIS — R55 Syncope and collapse: Secondary | ICD-10-CM | POA: Diagnosis not present

## 2017-02-26 DIAGNOSIS — M5412 Radiculopathy, cervical region: Secondary | ICD-10-CM | POA: Diagnosis not present

## 2017-02-26 DIAGNOSIS — G959 Disease of spinal cord, unspecified: Secondary | ICD-10-CM | POA: Diagnosis not present

## 2017-02-26 DIAGNOSIS — M5417 Radiculopathy, lumbosacral region: Secondary | ICD-10-CM | POA: Diagnosis not present

## 2017-02-26 LAB — ECHOCARDIOGRAM COMPLETE
Height: 64 in
Weight: 2931.24 oz

## 2017-02-26 MED ORDER — PERFLUTREN LIPID MICROSPHERE
1.0000 mL | INTRAVENOUS | Status: AC | PRN
Start: 2017-02-26 — End: 2017-02-26
  Administered 2017-02-26: 4 mL via INTRAVENOUS
  Filled 2017-02-26 (×2): qty 10

## 2017-02-26 MED ORDER — IOPAMIDOL (ISOVUE-370) INJECTION 76%
INTRAVENOUS | Status: AC
  Administered 2017-02-26: 50 mL
  Filled 2017-02-26: qty 50

## 2017-02-26 MED ORDER — GABAPENTIN 300 MG PO CAPS: 300.0000 mg | ORAL_CAPSULE | Freq: Every day | ORAL | Status: DC

## 2017-02-26 NOTE — Progress Notes (Signed)
EEG completed; results pending.    

## 2017-02-26 NOTE — Discharge Summary (Signed)
[facility] Discharge Summary  Patient name: Anna Chambers record number: [id number] Date of birth: [birth year] Age: 77 y.o. 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  Disposition: Home  Discharge Condition: Stable, improved  Discharge Exam:  General: well nourished, well developed, NAD with non-toxic appearance HEENT: normocephalic, atraumatic, moist mucous membranes, PERRLA, EOMI Neck: supple, non-tender without lymphadenopathy Cardiovascular: regular rate and rhythm without murmurs, rubs, or gallops Lungs: clear to auscultation bilaterally with normal work of breathing Abdomen: soft, non-tender, non-distended, normoactive bowel sounds Skin: warm, dry, no rashes or lesions, cap refill < 2 seconds Extremities: warm and well perfused, normal tone, no edema Neuro: CNII-XII intact, no dysarthria, fine motor intact  Brief Hospital Course:  NIYATI HEINKE is a 77 year old female presenting with syncope while driving to the doctor's office.  Code stroke initially called on presentation due to left-sided weakness though canceled we determine weakness was chronic.  Patient denied prodrome, postictal state, chest pain, shortness of breath, heart palpitations or nausea.  Neurology was consulted in order to pursue seizure workup.  Brain MRI and CTA unremarkable.  EEG findings were concerning for possible seizure and Keppra was initiated.  EtOH and UDS were negative on admission.  Orthostatics negative.  EKG consistent with sinus bradycardia otherwise unremarkable.  Echo showed 60-65% with trivial regurgitation mitral valve, tricuspid valve, and pulmonic valve.   There was consideration for heart arrhythmia triggering possible syncopal episode.  Decision was made for patient to follow-up with cardiology at discharge for 30-day Holter monitor given unremarkable telemetry.  Patient's gabapentin was weaned from 300 mg twice daily to once daily at night.  She was instructed not to drive for 6 months due to concern for seizure.  Issues for Follow Up:  1. Discussed beta-blocker use given persistent bradycardia. 2. Consider alternative statin trial given allergy to simvastatin and possible benefit outweighing risk. 3. Make sure patient has follow-up with cardiology for 30-day Holter monitor. 4. Patient was given 2 months worth of Keppra 500 mg twice daily with instructions to follow-up with neurology in 6 weeks.  Please be sure this appointment is scheduled.  Significant Procedures: None  Significant Labs and Imaging:  Recent Labs  Lab 02/25/17 1142 02/25/17 1147  WBC 10.2  --   HGB 13.4 14.3  HCT 40.8 42.0  PLT 220  --    Recent Labs  Lab 02/25/17 1142 02/25/17 1147  NA 140 142  K 3.8 3.8  CL 107 107  CO2 22  --   GLUCOSE 87 83  BUN 17 19  CREATININE 1.10* 1.00  CALCIUM 8.9  --   ALKPHOS 66  --   AST 23  --   ALT 21  --   ALBUMIN 3.5  --    INR: 0.94 APTT: 27 EtOH: Negative I-STAT troponin: 0.01 UDS: Negative UA: Negative TSH: 1.961 Lipid panel: Cholesterol 196, triglycerides 69, HDL 49, LDL 133 A1c: 5.9 EKG: Sinus bradycardia CTA head: Negative MRI brain: Negative CXR: Cardiomegaly with mild central pulmonary vascular congestion  Results/Tests Pending at Time of Discharge: None  Discharge Medications:  Allergies as of 02/27/2017      Reactions   Simvastatin Rash      Medication List    TAKE these medications  allopurinol 300 MG tablet Commonly known as:  ZYLOPRIM Take 300 mg by mouth daily.   aspirin 81 MG tablet Take 81 mg by mouth daily.   bisoprolol-hydrochlorothiazide 2.5-6.25 MG tablet Commonly known as:   ZIAC Take 1 tablet by mouth daily.   brimonidine-timolol 0.2-0.5 % ophthalmic solution Commonly known as:  COMBIGAN Administer 10 drops to both eyes Two (2) times a day. Frequency:PHARMDIR   Dosage:23ml     Instructions:  Note:Instill one drop in each eye twice daily. Dose: 1   brinzolamide 1 % ophthalmic suspension Commonly known as:  AZOPT Administer 1 drop to both eyes Two (2) times a day (at 8am and 5pm). Frequency:PHARMDIR   Dosage:0.0     Instructions:  Note:Instill one drop in each eye twice a day. Dose: 1   Calcium Carb-Cholecalciferol 600-800 MG-UNIT Tabs Take 600 mg by mouth.   colchicine 0.6 MG tablet Take 0.6 mg by mouth daily.   DOCOSAHEXAENOIC ACID PO Take by mouth.   gabapentin 300 MG capsule Commonly known as:  NEURONTIN Take 1 capsule (300 mg total) by mouth at bedtime. What changed:  when to take this   latanoprost 0.005 % ophthalmic solution Commonly known as:  XALATAN Administer 2.5 mL to both eyes nightly. Frequency:QD   Dosage:0.0     Instructions:  Note:Dose: 0.005 %   levETIRAcetam 500 MG tablet Commonly known as:  KEPPRA Take 1 tablet (500 mg total) by mouth 2 (two) times daily.   meloxicam 15 MG tablet Commonly known as:  MOBIC TAKE 1 TABLET BY MOUTH EVERY DAY   oxybutynin 5 MG 24 hr tablet Commonly known as:  DITROPAN-XL TK 1 T PO  QD   pilocarpine 2 % ophthalmic solution Commonly known as:  PILOCAR 1 drop 4 (four) times daily.   PRED FORTE 1 % ophthalmic suspension Generic drug:  prednisoLONE acetate Place 1 drop into the right eye 4 (four) times daily.   ULORIC 40 MG tablet Generic drug:  febuxostat TK 1 T PO ONCE D   Vitamin-B Complex Tabs Take by mouth.       Discharge Instructions: Please refer to Patient Instructions section of EMR for full details.  Patient was counseled important signs and symptoms that should prompt return to medical care, changes in medications, dietary instructions, activity restrictions, and follow up  appointments.   Follow-Up Appointments:  Follow-up Information    Garvin Fila, MD. Schedule an appointment as soon as possible for a visit in 6 week(s).   Specialties:  Neurology, Radiology Contact information: [address] [place] [address] [phone]        Skeet Latch, MD Follow up.   Specialty:  Cardiology Why:  office will call you for event monitor set up and follow up. Please give [place] a call if you did not heard by Friday Contact information: [address] [place] [address] [phone]        Perrin Maltese, MD. Call.   Specialty:  Internal Medicine Why:  Schedule f/u appt for hospitalization in the next 1-2 weeks. Contact information: [place] [address] [place], DO 02/27/2017, 1:49 PM [place]

## 2017-02-26 NOTE — Procedures (Signed)
ELECTROENCEPHALOGRAM REPORT  Date of Study: 02/26/2017  Patient's Name: Anna Chambers MRN: [id number] Date of Birth: 06/20/1940  Referring Provider: Candise Che, NP  Clinical History: This is a 77 year old woman with expressive aphasia and right-sided weakness  Medications: aspirin Ziac Allopurinol Colchicine Meloxicam  Technical Summary: A multichannel digital EEG recording measured by the international 10-20 system with electrodes applied with paste and impedances below 5000 ohms performed in our laboratory with EKG monitoring in an awake and asleep patient.  Hyperventilation and photic stimulation were not performed.  The digital EEG was referentially recorded, reformatted, and digitally filtered in a variety of bipolar and referential montages for optimal display.    Description: The patient is awake and asleep during the recording.  During maximal wakefulness, there is a symmetric, medium voltage 9 Hz posterior dominant rhythm that attenuates with eye opening.  The record is symmetric.  During drowsiness and sleep, there is an increase in theta slowing of the background.  Vertex waves and symmetric sleep spindles were seen.  Hyperventilation and photic stimulation were not performed. There were occasional sharp transients seen over the left temporal region, some with aftergoing slow wave seen. There were no electrographic seizures seen.    EKG lead was unremarkable.  Impression: This awake and asleep EEG is abnormal due to occasional sharp transients over the left temporal region.  Clinical Correlation of the above findings indicates focal cerebral dysfunction over the left temporal region with possible epileptogenic potential. Clinical correlation is advised.   Ellouise Newer, M.D.

## 2017-02-26 NOTE — Care Management Obs Status (Signed)
[facility] NOTIFICATION   Patient Details  Name: KYRAH SCHIRO MRN: [id number] Date of Birth: [birth year]   Medicare Observation Status Notification Given:  Yes    Erenest Rasher, RN 02/26/2017, 6:49 PM

## 2017-02-26 NOTE — Progress Notes (Signed)
NEUROHOSPITALISTS STROKE TEAM - DAILY PROGRESS NOTE   ADMISSION HISTORY: Anna Chambers is an 77 y.o. female who woke up feeling abnormal, she drove to her PCP and when arrived apparently drove into the building. EMS called and found her expressive aphasia and right sided weakness. On arrival to ED she was found to have resolved right arm and leg weakness and resting right facial droop that is symmetrical when smiling.  She does not have a h/o stroke. Denies any tingling or numbness. Denies any headaches. Denies history of headaches Denied any similar symptoms in the past Denies chest pain shortness of breath nausea vomiting abdominal pain. Denies easy bleeding or bruising. Denies being on any blood thinners at this time.  Date last known well: Unable to determine Time last known well: Unable to determine tPA Given: No: no known last known normal NIHSS1 Modified Rankin: Rankin Score=0  SUBJECTIVE (INTERVAL HISTORY) Husband is at the bedside. Patient is found laying in bed in NAD. Overall she feels her condition is rapidly improving. Voices no new complaints. No new events reported overnight.   OBJECTIVE Lab Results: CBC:  Recent Labs  Lab 02/25/17 1142 02/25/17 1147  WBC 10.2  --   HGB 13.4 14.3  HCT 40.8 42.0  MCV 85.0  --   PLT 220  --    BMP: Recent Labs  Lab 02/25/17 1142 02/25/17 1147  NA 140 142  K 3.8 3.8  CL 107 107  CO2 22  --   GLUCOSE 87 83  BUN 17 19  CREATININE 1.10* 1.00  CALCIUM 8.9  --    Liver Function Tests:  Recent Labs  Lab 02/25/17 1142  AST 23  ALT 21  ALKPHOS 66  BILITOT 0.8  PROT 6.4*  ALBUMIN 3.5   Thyroid Function Studies:  Recent Labs    02/25/17 2204  TSH 1.961   Coagulation Studies:  Recent Labs    02/25/17 1142  APTT 27  INR 0.94   Urinalysis:  Recent Labs  Lab 02/25/17 1230  Equality  LABSPEC 1.009  PHURINE 6.0  GLUCOSEU  NEGATIVE  HGBUR NEGATIVE  BILIRUBINUR NEGATIVE  KETONESUR NEGATIVE  PROTEINUR NEGATIVE  NITRITE NEGATIVE  LEUKOCYTESUR SMALL*   PHYSICAL EXAM Temp:  [98 F (36.7 C)-98.3 F (36.8 C)] 98.2 F (36.8 C) (01/22 1438) Pulse Rate:  [51-71] 60 (01/22 1438) Resp:  [12-22] 18 (01/22 1438) BP: (100-159)/(52-87) 112/61 (01/22 1438) SpO2:  [97 %-100 %] 99 % (01/22 1438) Weight:  [83.1 kg (183 lb 3.2 oz)] 83.1 kg (183 lb 3.2 oz) (01/21 1951) General - Well nourished, well developed, in no apparent distress HEENT-  Normocephalic, Normal external eye/conjunctiva.  Normal external ears. Normal external nose, mucus membranes and septum.   Cardiovascular - Regular rate and rhythm  Respiratory - Lungs clear bilaterally. No wheezing. Abdomen - soft and non-tender, BS normal Extremities- no edema or cyanosis NEURO:  Mental Status: AA&Ox3 Language: speech is clear.  Naming, repetition, fluency, and comprehension intact. Cranial Nerves: PERRL. EOMI, visual fields full, mild right eye ptosis which the patient says is baseline, mild right nasolabial fold flattening unclear new or old,, facial sensation intact, hearing intact, tongue/uvula/soft palate midline, normal sternocleidomastoid and trapezius muscle strength. No evidence of tongue atrophy or fibrillations Motor: 5/5 in all 4 extremities with normal tone normal range of motion. Mild Left hand weakness and Left foot drop at baseline Tone: is normal and bulk is normal Sensation- Intact to light touch bilaterally Coordination:  FTN intact bilaterally, no ataxia in BLE. Gait- deferred  IMAGING: I have personally reviewed the radiological images below and agree with the radiology interpretations. Mr Brain Wo Contrast Result Date: 02/25/2017 IMPRESSION: Normal noncontrast MRI of the head for age. Electronically Signed   By: Elon Alas M.D.   On: 02/25/2017 15:06   Ct Head Code Stroke Wo Contrast IMPRESSION: 1. Stable since 2012 and normal  noncontrast CT appearance of the brain. ASPECTS is 10. 2.  No acute traumatic injury identified. 3. These results were communicated to Dr. Rory Percy at 12:05 pmon 1/21/2019by text page via the [facility] messaging system. Electronically Signed   By: Genevie Ann M.D.   On: 02/25/2017 12:05   Echocardiogram:                                              Study Conclusions - Procedure narrative: Transthoracic echocardiography. Image   quality was suboptimal. The study was technically difficult, as a   result of poor sound wave transmission and body habitus.   Intravenous contrast (Definity) was administered. - Left ventricle: The cavity size was normal. There was moderate   focal basal hypertrophy. Systolic function was normal. The   estimated ejection fraction was in the range of 60% to 65%. Wall   motion was normal; there were no regional wall motion   abnormalities. Left ventricular diastolic function parameters   were normal. - Mitral valve: There was trivial regurgitation. - Tricuspid valve: There was trivial regurgitation. - Pulmonic valve: There was trivial regurgitation. - Pulmonary arteries: Systolic pressure could not be accurately   estimated.  EEG:                                                                   PENDING CTA Head/Neck:                                                PENDING    IMPRESSION: [name] is a 77 y.o. female with PMH of  Cervical Myelopathy, Depression and Hypertension who presents with acute onset transient expressive aphasia and right sided weakness which resolved on arrival.  She remembers the whole episode.  No seizure-like activity noted.  No bowel bladder incontinence. Her symptoms of right-sided weakness along with expressive aphasia localized to the left MCA territory. Not a candidate for TPA due to low stroke scale and unclear last seen normal. No evidence of large vessel occlusion on exam.  Will be evaluated by an MRA head and neck  R/O STROKE vs  Seizure vs Syncope Suspected Etiology: Unknown at this time Resultant Symptoms: expressive aphasia and right sided weakness  Stroke Risk Factors: hypertension Other Stroke Risk Factors: Advanced age, Obesity, Body mass index is 31.45 kg/m.  OSA w/CPAP   Outstanding Stroke Work-up Studies:     EEG:  PENDING CTA Head/Neck:                                                    PENDING  02/26/2017 ASSESSMENT:   Neuro exam stable with no reports of new/worsening weakness. Patient states she has Left hand weakness and Left foot drop at baseline. Patient able to clearly describe yesterdays events. She says she initially felt dizzy with some very mild/brief blurred vision. She admits to a "fainting spell" in 2014 but states this episode was "different".  Will check EEG to rule out seizures and check CTA to complete stroke work up. Stroke labs pending  PLAN  02/26/2017: Continue Aspirin for now Frequent neuro checks Telemetry monitoring PT/OT/SLP Ongoing aggressive stroke risk factor management Patient counseled to be compliant with her antithrombotic medications Patient counseled on Lifestyle modifications including, Diet, Exercise, and Stress Follow up with [facility] in 6 weeks  R/O AFIB: May need Outpatient TEE and Loop Recorder Placement or 30 day event monitor at discharge  R/O SEIZURES: EEG- PENDING No seizure activity reported overnight  HYPERTENSION: Stable, some episodes of hypotension noted overnight Avoid Hypotension and Dehydration Consider lowering dose of Ziac, if hypotension continues Long term BP goal normotensive. May slowly restart home B/P medications Home Meds: Ziac  HYPERLIPIDEMIA: No results found for: CHOL, TRIG, HDL, CHOLHDL, VLDL, LDLCALC - PENDING Home Meds:  NONE LDL  goal < 70 If Statin intolerant - May need PCSK9 Inhibitors - Repatha or Praluent at  discharge  DIABETES: No results found for: HGBA1C - PENDING Recent Labs  Lab 02/25/17 1141  GLUCAP 76  HgbA1c goal < 7.0  OBESITY Obesity, Body mass index is 31.45 kg/m. Greater than/equal to 30  Other Active Problems: Active Problems:   Syncope    Hospital day # 0 VTE prophylaxis: Lovenox  Diet : Diet regular Room service appropriate? Yes; Fluid consistency: Thin   FAMILY UPDATES:  family at bedside  TEAM UPDATES: Dickie La, MD   Prior Home Stroke Medications:  aspirin 81 mg daily  Discharge Stroke Meds:  Please discharge patient on aspirin 81 mg daily for now  Disposition: 01-Home or Self Care Therapy Recs:               HOME  Follow Up:  Follow-up Information    Garvin Fila, MD. Schedule an appointment as soon as possible for a visit in 6 week(s).   Specialties:  Neurology, Radiology Contact information: [address] [place] [address] [phone]          Perrin Maltese, MD -PCP Follow up in 1-2 weeks     Assessment & plan discussed with with attending physician and they are in agreement.    Renie Ora Stroke Neurology Team 02/26/2017 3:34 PM I have personally examined this patient, reviewed notes, independently viewed imaging studies, participated in medical decision making and plan of care.ROS completed by me personally and pertinent positives fully documented  I have made any additions or clarifications directly to the above note. Agree with note above.  She presented with transient episode of aphasia and right hemiparesis possible left hemispheric TIA versus seizure. Recommend check EEG. Continue ongoing stroke workup. Greater than 50% time during this 35 minute visit was spent on counseling and coordination of care about her TIA workup and  answering questions  Antony Contras, MD Medical Director [place] Pager: [phone] 02/26/2017 4:34 PM  To contact Stroke Continuity provider, please refer to  [email or web address] After hours, contact General Neurology

## 2017-02-26 NOTE — Progress Notes (Signed)
Will review echo findings when available. No events on tele overnight - mainly sinus bradycardia and some artifact. Will likely recommend 30 day outpatient monitor after discharge.  Pixie Casino, MD, [facility], [place] Director of the [facility]  Card[facility] Diplomate of the American Board of Clinical Lipidology Attending Cardiologist  Direct Dial: [phone]  Fax: [phone]  Website:  [email or web address]

## 2017-02-26 NOTE — Progress Notes (Signed)
Family Medicine Teaching Service Daily Progress Note Intern Pager: [phone]  Patient name: [name] record number: [id number] Date of birth: 02-Apr-1940 Age: 77 y.o. Gender: female  Primary Care Provider: Perrin Maltese, MD Consultants: Neuro, cardiology Code Status: Full code  Pt Overview and Major Events to Date:  Anna Chambers is a 77 y.o. female presenting with syncope while driving to the doctor's office. 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  Assessment and Plan:  Syncope: Acute. Has remote history of syncope in 2016. Occurred while patient was driving with no prodrome or postictal state. Continues to have no chest pain, shortness of breath, heart palpitations, or nausea.  EKG with sinus rhythm and rate of 62 bmp.  Code stroke initially called in ED due to weakness which was discovered as chronic. Code stroke subsequently called off.  EtOH and UDS negative, BMP without electrolyte abnormalities, CBC wnl.  Differential includes heart arrythmia, vasovagal event, hypotension, seizure, hypoglycemia, medication side effect.  Heart rate has gotten as low as 51 while in the ED, so bradycardia may have contributed to syncope.  Vasovagal unlikely cause since patient not in stressful situation or using the bathroom, orthostatic hypotension less likely since patient was sitting, and orthostatic vitals were negative.  EKG on 1/22 showed sinus bradycardia. - Cardiology consulted, appreciate recommendations - Echo today - orthostatic vital signs - am EKG  Cervical myelopathy and radiculopathy: Patient has had longstanding neuropathy and radiculopathy causing numbness, weakness, and tingling in her bilateral hands.  This is a stable problem for her and is managed outpatient by neurology.  She takes gabapentin 300 mg BID for these symptoms.  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  No acute changes in extremity strength or sensation.   - continue home gabapentin  Hypertension: Has been hypertensive during admission.  Takes bisoprolol-HCTZ at home for this. - continue home bisoprolol-HCTZ - consider switching from beta-blocker given bradycardia before discharge  Overactive bladder: Stable. Patient takes oxybutynin for this at home.  Doubt that this medication could contribute to syncope since it is anti-cholinergic and would decrease vagal tone. - continue home oxybutynin  Glaucoma: Stable.  Patient has chronic right eye droop as a result of an eye surgery per her report.  Patient takes a variety of eye drops for management of her chronic glaucoma. - continue home regimen  FEN/GI: regular diet PPx: lovenox  Disposition: Home  Subjective:  Doing well this morning.  No pain, no palpitations.  Her head does feel funny when she stands up.  Objective: Temp:  [97.6 F (36.4 C)-98.3 F (36.8 C)] 98.3 F (36.8 C) (01/22 0444) Pulse Rate:  [51-71] 54 (01/22 0444) Resp:  [12-22] 18 (01/22 0444) BP: (100-165)/(52-87) 100/52 (01/22 0444) SpO2:  [97 %-100 %] 100 % (01/22 0444) Weight:  [180 lb (81.6 kg)-183 lb 3.2 oz (83.1 kg)] 183 lb 3.2 oz (83.1 kg) (01/21 1951) Physical Exam: General: Well appearing, sitting in bed Cardiovascular: Normal S1 and S2, no murmurs, rubs or gallops Respiratory: Clear to auscultation Extremities: Mild swelling in feet, normal PT and DP pulses Neuro:  Normal strength in ankles.  Laboratory: Recent Labs  Lab 02/25/17 1142 02/25/17 1147  WBC 10.2  --   HGB 13.4 14.3  HCT 40.8 42.0  PLT 220  --    Recent Labs  Lab 02/25/17 1142 02/25/17 1147  NA 140 142  K 3.8 3.8  CL 107 107  CO2 22  --   BUN 17 19  CREATININE 1.10* 1.00  CALCIUM 8.9  --   PROT 6.4*  --   BILITOT 0.8  --   ALKPHOS 66  --   ALT 21  --    AST 23  --   GLUCOSE 87 83    Imaging/Diagnostic Tests: Echo pending  Landry Dyke, Medical Student 02/26/2017, 8:25 AM  [place] Intern pager: [phone], text pages welcome  I have personally seen and examined this patient with Landry Dyke and agree with the above note. The following is my additional documentation.   Physical Exam: General: well nourished, well developed, NAD with non-toxic appearance HEENT: normocephalic, atraumatic, moist mucous membranes Neck: supple, non-tender without lymphadenopathy Cardiovascular: regular rate and rhythm without murmurs, rubs, or gallops Lungs: clear to auscultation bilaterally with normal work of breathing Abdomen: soft, non-tender, non-distended, normoactive bowel sounds Skin: warm, dry, no rashes or lesions, cap refill < 2 seconds Extremities: warm and well perfused, normal tone, no edema  Assessment/Plan Anna Chambers is a 77 y.o. female presenting with syncope.  PMH significant for cervical myelopathy with cervical radiculopathy with left foot drop, HTN, overactive bladder, glaucoma.  Syncope: Acute.  Resolved.  Uncertain etiology.  Negative orthostatics.  Pending echocardiogram.  Suspicious for arrhythmia though absent on telemetry during admission.  Cardiology aware with intentions to place patient on 30-day monitor and discharge. Cervical myelopathy and radiculopathy with left foot drop: Chronic.  Has baseline left-sided hemiparesis.  Unchanged from baseline on presentation.  Continuing home gabapentin 300 mg twice daily. Hypertension: Chronic.  Remains normotensive.  Continuing home bisoprolol-HCTZ. Overactive bladder: Chronic.  Stable.  Continuing home oxybutynin. Glaucoma: Chronic.  Stable.  Continuing home eyedrop regimen.  Dispo: Anticipate discharge likely 1/22 following echocardiogram with plans for cardiology follow-up with 30-day Holter monitor.  Harriet [place], [place],  PGY-2

## 2017-02-26 NOTE — Progress Notes (Signed)
Echo without concerning findings - will arrange for 30 day monitor and follow-up with Dr. Oval Linsey in the office afterwards.  Will sign-off. Call with questions.  Pixie Casino, MD, [facility], [place] Director of the Advanced Lipid Disorders &  Cardiovascular Risk Reduction Clinic Diplomate of the American Board of Clinical Lipidology Attending Cardiologist  Direct Dial: [phone]  Fax: [phone]  Website:  [email or web address]

## 2017-02-26 NOTE — Progress Notes (Signed)
Patient arrived to the unit from the emergency department.  Patient is alert and oriented x 4.  No complaints of pain.  Skin assessment complete . No skin issues.  IV intact to the left antecubital.  Educated the patient on how to reach the staff on the unit.  Informed the patient on the importance of calling the staff prior to getting out of bed. Lowered the bed, placed the call light within reach and activated the bed alarm.  Will continue to monitor the patient and notify as needed.

## 2017-02-26 NOTE — Progress Notes (Signed)
  Echocardiogram 2D Echocardiogram has been performed.  Bobbye Charleston 02/26/2017, 12:32 PM

## 2017-02-27 ENCOUNTER — Encounter (HOSPITAL_COMMUNITY): Payer: Self-pay | Admitting: General Practice

## 2017-02-27 DIAGNOSIS — R55 Syncope and collapse: Secondary | ICD-10-CM | POA: Diagnosis not present

## 2017-02-27 LAB — LIPID PANEL
Cholesterol: 196 mg/dL (ref 0–200)
HDL: 49 mg/dL (ref >40)
LDL Cholesterol: 133 mg/dL — ABNORMAL HIGH (ref 0–99)
Total CHOL/HDL Ratio: 4 RATIO
Triglycerides: 69 mg/dL (ref <150)
VLDL: 14 mg/dL (ref 0–40)

## 2017-02-27 LAB — HEMOGLOBIN A1C
Hgb A1c MFr Bld: 5.9 % — ABNORMAL HIGH (ref 4.8–5.6)
Mean Plasma Glucose: 122.63 mg/dL

## 2017-02-27 MED ORDER — LEVETIRACETAM 500 MG PO TABS: 500.0000 mg | ORAL_TABLET | Freq: Two times a day (BID) | ORAL | 0 refills | Status: DC

## 2017-02-27 MED ORDER — LEVETIRACETAM 500 MG PO TABS
500.0000 mg | ORAL_TABLET | Freq: Two times a day (BID) | ORAL | Status: DC
  Administered 2017-02-27: 500 mg via ORAL
  Filled 2017-02-27: qty 1

## 2017-02-27 MED ORDER — GABAPENTIN 300 MG PO CAPS: 300.0000 mg | ORAL_CAPSULE | Freq: Every day | ORAL | 0 refills | Status: DC

## 2017-02-27 NOTE — Progress Notes (Signed)
NEUROHOSPITALISTS STROKE TEAM - DAILY PROGRESS NOTE   ADMISSION HISTORY: Anna Chambers is an 77 y.o. female who woke up feeling abnormal, she drove to her PCP and when arrived apparently drove into the building. EMS called and found her expressive aphasia and right sided weakness. On arrival to ED she was found to have resolved right arm and leg weakness and resting right facial droop that is symmetrical when smiling.  She does not have a h/o stroke. Denies any tingling or numbness. Denies any headaches. Denies history of headaches Denied any similar symptoms in the past Denies chest pain shortness of breath nausea vomiting abdominal pain. Denies easy bleeding or bruising. Denies being on any blood thinners at this time.  Date last known well: Unable to determine Time last known well: Unable to determine tPA Given: No: no known last known normal NIHSS1 Modified Rankin: Rankin Score=0  SUBJECTIVE (INTERVAL HISTORY) Daughter is at the bedside. Patient is found laying in bed in NAD. Overall she feels her condition is rapidly improving. Voices no new complaints. No new events reported overnight.EEg shows left temporal focal cerebral dysfunction over the left temporal region with possible epileptogenic potential. CTA of brain and neck are unremarkable but CT scan of the neck shows severe C3-4 and C5-6 neural foraminal narrowing and prior changes of surgery     OBJECTIVE Lab Results: CBC:  Recent Labs  Lab 02/25/17 1142 02/25/17 1147  WBC 10.2  --   HGB 13.4 14.3  HCT 40.8 42.0  MCV 85.0  --   PLT 220  --    BMP: Recent Labs  Lab 02/25/17 1142 02/25/17 1147  NA 140 142  K 3.8 3.8  CL 107 107  CO2 22  --   GLUCOSE 87 83  BUN 17 19  CREATININE 1.10* 1.00  CALCIUM 8.9  --    Liver Function Tests:  Recent Labs  Lab 02/25/17 1142  AST 23  ALT 21  ALKPHOS 66  BILITOT 0.8  PROT 6.4*  ALBUMIN 3.5   Thyroid Function  Studies:  Recent Labs    02/25/17 2204  TSH 1.961   Coagulation Studies:  Recent Labs    02/25/17 1142  APTT 27  INR 0.94   Urinalysis:  Recent Labs  Lab 02/25/17 1230  COLORURINE STRAW*  APPEARANCEUR CLEAR  LABSPEC 1.009  PHURINE 6.0  GLUCOSEU NEGATIVE  HGBUR NEGATIVE  BILIRUBINUR NEGATIVE  KETONESUR NEGATIVE  PROTEINUR NEGATIVE  NITRITE NEGATIVE  LEUKOCYTESUR SMALL*   PHYSICAL EXAM Temp:  [97.9 F (36.6 C)-98.2 F (36.8 C)] 98.2 F (36.8 C) (01/23 1356) Pulse Rate:  [50-74] 74 (01/23 1356) Resp:  [18-19] 19 (01/23 1356) BP: (101-134)/(55-66) 101/55 (01/23 1356) SpO2:  [97 %-98 %] 98 % (01/23 1356) General - Well nourished, well developed, in no apparent distress HEENT-  Normocephalic, Normal external eye/conjunctiva.  Normal external ears. Normal external nose, mucus membranes and septum.   Cardiovascular - Regular rate and rhythm  Respiratory - Lungs clear bilaterally. No wheezing. Abdomen - soft and non-tender, BS normal Extremities- no edema or cyanosis NEURO:  Mental Status: AA&Ox3 Language: speech is clear.  Naming, repetition, fluency, and comprehension intact. Cranial Nerves: PERRL. EOMI, visual fields full, mild right eye ptosis which the patient says is baseline, mild right nasolabial fold flattening unclear new or old,, facial sensation intact, hearing intact, tongue/uvula/soft palate midline, normal sternocleidomastoid and trapezius muscle strength. No evidence of tongue atrophy or fibrillations Motor: 5/5 in all 4 extremities with normal tone normal  range of motion. Mild Left hand weakness and Left foot drop at baseline Tone: is normal and bulk is normal Sensation- Intact to light touch bilaterally Coordination: FTN intact bilaterally, no ataxia in BLE. Gait- deferred  IMAGING: I have personally reviewed the radiological images below and agree with the radiology interpretations. Mr Brain Wo Contrast Result Date: 02/25/2017 IMPRESSION: Normal  noncontrast MRI of the head for age. Electronically Signed   By: Elon Alas M.D.   On: 02/25/2017 15:06   Ct Head Code Stroke Wo Contrast IMPRESSION: 1. Stable since 2012 and normal noncontrast CT appearance of the brain. ASPECTS is 10. 2.  No acute traumatic injury identified. 3. These results were communicated to Dr. Rory Percy at 12:05 pmon 1/21/2019by text page via the [facility] messaging system. Electronically Signed   By: Genevie Ann M.D.   On: 02/25/2017 12:05   Echocardiogram:                                              Study Conclusions - Procedure narrative: Transthoracic echocardiography. Image   quality was suboptimal. The study was technically difficult, as a   result of poor sound wave transmission and body habitus.   Intravenous contrast (Definity) was administered. - Left ventricle: The cavity size was normal. There was moderate   focal basal hypertrophy. Systolic function was normal. The   estimated ejection fraction was in the range of 60% to 65%. Wall   motion was normal; there were no regional wall motion   abnormalities. Left ventricular diastolic function parameters   were normal. - Mitral valve: There was trivial regurgitation. - Tricuspid valve: There was trivial regurgitation. - Pulmonic valve: There was trivial regurgitation. - Pulmonary arteries: Systolic pressure could not be accurately   estimated.  EEG:                                                                   PENDING CTA Head/Neck:                                                PENDING    IMPRESSION: Anna Chambers is a 77 y.o. female with PMH of  Cervical Myelopathy, Depression and Hypertension who presents with acute onset transient expressive aphasia and right sided weakness which resolved on arrival.  She remembers the whole episode.  No seizure-like activity noted.  No bowel bladder incontinence. Her symptoms of right-sided weakness along with expressive aphasia localized to the left MCA  territory. Not a candidate for TPA due to low stroke scale and unclear last seen normal. No evidence of large vessel occlusion on exam.  Will be evaluated by an MRA head and neck  R/O STROKE vs Seizure vs Syncope Suspected Etiology: probable seizure given abnormal EEG and negative imaging fir stroke Resultant Symptoms: expressive aphasia and right sided weakness  Stroke Risk Factors: hypertension Other Stroke Risk Factors: Advanced age, Obesity, Body mass index is 31.45 kg/m.  OSA w/CPAP      02/27/2017  ASSESSMENT:   Neuro exam stable with no reports of new/worsening weakness. Patient states she has Left hand weakness and Left foot drop at baseline. Patient able to clearly describe yesterdays events. She says she initially felt dizzy with some very mild/brief blurred vision. She admits to a "fainting spell" in 2014 but states this episode was "different".  Will check EEG to rule out seizures and check CTA to complete stroke work up. Stroke labs pending  PLAN  02/27/2017: Continue Aspirin for now Frequent neuro checks Telemetry monitoring PT/OT/SLP Ongoing aggressive stroke risk factor management Patient counseled to be compliant with her antithrombotic medications Patient counseled on Lifestyle modifications including, Diet, Exercise, and Stress Follow up with [facility] in 6 weeks  R/O AFIB: May need Outpatient TEE and Loop Recorder Placement or 30 day event monitor at discharge  R/O SEIZURES: EEG- left temporal focal cerebral dysfunction over the left temporal region with possible epileptogenic potential   No seizure activity reported overnight  HYPERTENSION: Stable, some episodes of hypotension noted overnight Avoid Hypotension and Dehydration Consider lowering dose of Ziac, if hypotension continues Long term BP goal normotensive. May slowly restart home B/P medications Home Meds: Ziac  HYPERLIPIDEMIA:    Component Value Date/Time   CHOL 196 02/27/2017  0237   TRIG 69 02/27/2017 0237   HDL 49 02/27/2017 0237   CHOLHDL 4.0 02/27/2017 0237   VLDL 14 02/27/2017 0237   LDLCALC 133 (H) 02/27/2017 0237   - PENDING Home Meds:  NONE LDL  goal < 70 If Statin intolerant - May need PCSK9 Inhibitors - Repatha or Praluent at discharge  DIABETES: Lab Results  Component Value Date   HGBA1C 5.9 (H) 02/27/2017   - PENDING Recent Labs  Lab 02/25/17 1141  GLUCAP 76  HgbA1c goal < 7.0  OBESITY Obesity, Body mass index is 31.45 kg/m. Greater than/equal to 30  Other Active Problems: Active Problems:   Syncope    Hospital day # 0 VTE prophylaxis: Lovenox  Diet : Diet regular Room service appropriate? Yes; Fluid consistency: Thin Diet - low sodium heart healthy   FAMILY UPDATES:  family at bedside  TEAM UPDATES: Dickie La, MD   Prior Home Stroke Medications:  aspirin 81 mg daily  Discharge Stroke Meds:  Please discharge patient on aspirin 81 mg daily for now  Disposition: 01-Home or Self Care Therapy Recs:               HOME  Follow Up:  Follow-up Information    Garvin Fila, MD. Schedule an appointment as soon as possible for a visit in 6 week(s).   Specialties:  Neurology, Radiology Contact information: [address] [place]  [address] [phone]        Skeet Latch, MD Follow up.   Specialty:  Cardiology Why:  office will call you for event monitor set up and follow up. Please give [place] a call if you did not heard by Friday Contact information: [address] [place] [address] [phone]        Perrin Maltese, MD. Call.   Specialty:  Internal Medicine Why:  Schedule f/u appt for hospitalization in the next 1-2 weeks. Contact information: [place] [address] [phone]          Perrin Maltese, MD -PCP Follow up in 1-2 weeks       I have personally examined this patient, reviewed notes, independently viewed imaging studies, participated in  medical  decision making and plan of care.ROS completed by me personally and pertinent positives fully documented  I have made any additions or clarifications directly to the above note. Agree with note above.  She presented with transient episode of aphasia and right hemiparesis possible left hemispheric TIA versus seizure. Recommend  Start keppra 500 mg twice daily for seizure prophylaxis. D/w daughter and Do not drive for 6 months  Greater than 50% time during this 25 minute visit was spent on counseling and coordination of care about her TIA workup and answering questions.F/U in stroke clinic in 6 weeksStroke team will sign off. Call for questions Antony Contras, MD Medical Director [place] Pager: [phone] 02/27/2017 3:23 PM  To contact Stroke Continuity provider, please refer to [email or web address] After hours, contact General Neurology

## 2017-02-27 NOTE — Progress Notes (Signed)
Anna Chambers to be D/C'd Home per MD order.  Discussed with the patient and all questions fully answered.  VSS, Skin clean, dry and intact without evidence of skin break down, no evidence of skin tears noted. IV catheter discontinued intact. Site without signs and symptoms of complications. Dressing and pressure applied.  An After Visit Summary was printed and given to the patient. Patient received prescription.  D/c education completed with patient/family including follow up instructions, medication list, d/c activities limitations if indicated, with other d/c instructions as indicated by MD - patient able to verbalize understanding, all questions fully answered.   Patient instructed to return to ED, call 911, or call MD for any changes in condition.   Patient escorted via [place], and D/C home via private auto. Allergies as of 02/27/2017      Reactions   Simvastatin Rash      Medication List    TAKE these medications   allopurinol 300 MG tablet Commonly known as:  ZYLOPRIM Take 300 mg by mouth daily.   aspirin 81 MG tablet Take 81 mg by mouth daily.   bisoprolol-hydrochlorothiazide 2.5-6.25 MG tablet Commonly known as:  ZIAC Take 1 tablet by mouth daily.   brimonidine-timolol 0.2-0.5 % ophthalmic solution Commonly known as:  COMBIGAN Administer 10 drops to both eyes Two (2) times a day. Frequency:PHARMDIR   Dosage:75ml     Instructions:  Note:Instill one drop in each eye twice daily. Dose: 1   brinzolamide 1 % ophthalmic suspension Commonly known as:  AZOPT Administer 1 drop to both eyes Two (2) times a day (at 8am and 5pm). Frequency:PHARMDIR   Dosage:0.0     Instructions:  Note:Instill one drop in each eye twice a day. Dose: 1   Calcium Carb-Cholecalciferol 600-800 MG-UNIT Tabs Take 600 mg by mouth.   colchicine 0.6 MG tablet Take 0.6 mg by mouth daily.   DOCOSAHEXAENOIC ACID PO Take by mouth.   gabapentin 300 MG capsule Commonly known as:  NEURONTIN Take 1 capsule  (300 mg total) by mouth at bedtime. What changed:  when to take this   latanoprost 0.005 % ophthalmic solution Commonly known as:  XALATAN Administer 2.5 mL to both eyes nightly. Frequency:QD   Dosage:0.0     Instructions:  Note:Dose: 0.005 %   levETIRAcetam 500 MG tablet Commonly known as:  KEPPRA Take 1 tablet (500 mg total) by mouth 2 (two) times daily.   meloxicam 15 MG tablet Commonly known as:  MOBIC TAKE 1 TABLET BY MOUTH EVERY DAY   oxybutynin 5 MG 24 hr tablet Commonly known as:  DITROPAN-XL TK 1 T PO  QD   pilocarpine 2 % ophthalmic solution Commonly known as:  PILOCAR 1 drop 4 (four) times daily.   PRED FORTE 1 % ophthalmic suspension Generic drug:  prednisoLONE acetate Place 1 drop into the right eye 4 (four) times daily.   ULORIC 40 MG tablet Generic drug:  febuxostat TK 1 T PO ONCE D   Vitamin-B Complex Tabs Take by mouth.      Anna Chambers 02/27/2017 4:21 PM

## 2017-02-27 NOTE — Discharge Instructions (Signed)
You were hospitalized for a syncopal episode.  Cardiology and Neurology saw you.  Possible causes of this episode are a seizure and a heart arrhythmia.   - You will follow up with cardiology for placement of 30 day monitor of your heart - You were started on a medication for seizures, Keppra.  You will continue to take 500 mg twice a day.  - You will follow up with Neurology in 6 weeks.  - We decreased your dose of gabapentin.  Take 300 mg once a day in the evening.   - Do no drive a vehicle for 6 months.

## 2017-03-08 ENCOUNTER — Ambulatory Visit (INDEPENDENT_AMBULATORY_CARE_PROVIDER_SITE_OTHER): Payer: Medicare Other

## 2017-03-08 DIAGNOSIS — R55 Syncope and collapse: Secondary | ICD-10-CM | POA: Diagnosis not present

## 2017-04-10 NOTE — Progress Notes (Signed)
Cardiology Office Note   Date:  04/15/2017   ID:  Anna Chambers, DOB 1940/11/09, MRN [id number]  PCP:  Perrin Maltese, MD  Cardiologist:  Dr. Oval Linsey  Chief Complaint  Patient presents with  . Follow-up     History of Present Illness: Anna Chambers is a 77 y.o. female who presents for ongoing assessment and management of hypertension, who was seen on consultation episode of syncope while driving. The patient was ruled out for CVA, she has chronic left-sided weakness. ECG was concerning for possible seizure activity.  Echocardiogram showed 60%/-65%, trivial mitral valve and tricuspid valve regurgitation. The patient had negative orthostatics. The patient was to have a 30 day cardiac monitor to evaluate for arrhythmias causing episode. During hospitalization the patient's gabapentin was 300 mg twice a day to once daily at night. She was instructed not to drive for 6 months due to concern for recurrent seizures.  She is without symptoms today. She is without dizziness, near syncope or syncopal episodes. She has gone back to ADL's except for driving. She states that she has not driven at night for several years.   Past Medical History:  Diagnosis Date  . Cervical myelopathy with cervical radiculopathy   . Depression   . Hypertension   . Sleep apnea   . Syncope and collapse 02/25/2017    Past Surgical History:  Procedure Laterality Date  . CATARACT EXTRACTION    . EYE SURGERY    . FOOT SURGERY Left   . SPINE SURGERY     ACDF C3-4  . VAGINAL HYSTERECTOMY       Current Outpatient Medications  Medication Sig Dispense Refill  . allopurinol (ZYLOPRIM) 300 MG tablet Take 300 mg by mouth daily.    Marland Kitchen aspirin 81 MG tablet Take 81 mg by mouth daily.    . bisoprolol-hydrochlorothiazide (ZIAC) 2.5-6.25 MG per tablet Take 1 tablet by mouth daily.    . brimonidine-timolol (COMBIGAN) 0.2-0.5 % ophthalmic solution Administer 10 drops to both eyes Two (2) times a day. Frequency:PHARMDIR    Dosage:31ml     Instructions:  Note:Instill one drop in each eye twice daily. Dose: 1    . brinzolamide (AZOPT) 1 % ophthalmic suspension Administer 1 drop to both eyes Two (2) times a day (at 8am and 5pm). Frequency:PHARMDIR   Dosage:0.0     Instructions:  Note:Instill one drop in each eye twice a day. Dose: 1    . Cholecalciferol (VITAMIN D3) 50000 units CAPS Take 1 capsule by mouth once a week.    . gabapentin (NEURONTIN) 300 MG capsule Take 1 capsule (300 mg total) by mouth at bedtime. 30 capsule 0  . latanoprost (XALATAN) 0.005 % ophthalmic solution Administer 2.5 mL to both eyes nightly. Frequency:QD   Dosage:0.0     Instructions:  Note:Dose: 0.005 %    . levETIRAcetam (KEPPRA) 500 MG tablet Take 1 tablet (500 mg total) by mouth 2 (two) times daily. 120 tablet 0  . oxybutynin (DITROPAN-XL) 5 MG 24 hr tablet TK 1 T PO  QD  1  . pilocarpine (PILOCAR) 2 % ophthalmic solution 1 drop 4 (four) times daily.    . prednisoLONE acetate (PRED FORTE) 1 % ophthalmic suspension Place 1 drop into the right eye 4 (four) times daily.     Current Facility-Administered Medications  Medication Dose Route Frequency Provider Last Rate Last Dose  . betamethasone acetate-betamethasone sodium phosphate (CELESTONE) injection 3 mg  3 mg Intramuscular Once Edrick Kins, DPM  Allergies:   Simvastatin    Social History:  The patient  reports that  has never smoked. she has never used smokeless tobacco. She reports that she does not drink alcohol or use drugs.   Family History:  The patient's family history includes Cancer in her father; Healthy in her son; Heart disease in her mother; Hypertension in her father, mother, sister, and sister; Kidney failure in her mother.    ROS: All other systems are reviewed and negative. Unless otherwise mentioned in H&P    PHYSICAL EXAM: VS:  BP 120/72   Pulse 68   Ht 5\' 4"  (1.626 m)   Wt 185 lb 6.4 oz (84.1 kg)   LMP  (LMP Unknown)   BMI 31.82 kg/m  , BMI Body  mass index is 31.82 kg/m. GEN: Well nourished, well developed, in no acute distress  HEENT: normal  Neck: no JVD, carotid bruits, or masses Cardiac: RRR; no murmurs, rubs, or gallops,no edema  Respiratory:  clear to auscultation bilaterally, normal work of breathing GI: soft, nontender, nondistended, + BS MS: no deformity or atrophy  Skin: warm and dry, no rash Neuro:  Strength and sensation are intact Psych: euthymic mood, full affect     Recent Labs: 02/25/2017: ALT 21; BUN 19; Creatinine, Ser 1.00; Hemoglobin 14.3; Platelets 220; Potassium 3.8; Sodium 142; TSH 1.961    Lipid Panel    Component Value Date/Time   CHOL 196 02/27/2017 0237   TRIG 69 02/27/2017 0237   HDL 49 02/27/2017 0237   CHOLHDL 4.0 02/27/2017 0237   VLDL 14 02/27/2017 0237   LDLCALC 133 (H) 02/27/2017 0237      Wt Readings from Last 3 Encounters:  04/15/17 185 lb 6.4 oz (84.1 kg)  02/25/17 183 lb 3.2 oz (83.1 kg)  05/23/16 178 lb 9 oz (81 kg)    Other studies Reviewed: Echocardiogram March 08, 2017 Procedure narrative: Transthoracic echocardiography. Image   quality was suboptimal. The study was technically difficult, as a   result of poor sound wave transmission and body habitus.   Intravenous contrast (Definity) was administered. - Left ventricle: The cavity size was normal. There was moderate   focal basal hypertrophy. Systolic function was normal. The   estimated ejection fraction was in the range of 60% to 65%. Wall   motion was normal; there were no regional wall motion   abnormalities. Left ventricular diastolic function parameters   were normal. - Mitral valve: There was trivial regurgitation. - Tricuspid valve: There was trivial regurgitation. - Pulmonic valve: There was trivial regurgitation. - Pulmonary arteries: Systolic pressure could not be accurately   estimated.  ASSESSMENT AND PLAN:  1.    Current medicines are reviewed at length with the patient today.    Labs/ tests  ordered today include: none Phill Myron. West Pugh, ANP, AACC   04/15/2017 12:28 PM    [facility] [address], [place], [place] [address] Phone: [phone]; Fax: [phone]

## 2017-04-15 ENCOUNTER — Ambulatory Visit (INDEPENDENT_AMBULATORY_CARE_PROVIDER_SITE_OTHER): Payer: Medicare Other | Admitting: Adult Health

## 2017-04-15 ENCOUNTER — Encounter: Payer: Self-pay | Admitting: Adult Health

## 2017-04-15 VITALS — BP 120/72 | HR 68 | Ht 64.0 in | Wt 185.4 lb

## 2017-04-15 DIAGNOSIS — R55 Syncope and collapse: Secondary | ICD-10-CM | POA: Diagnosis not present

## 2017-04-15 DIAGNOSIS — I10 Essential (primary) hypertension: Secondary | ICD-10-CM | POA: Diagnosis not present

## 2017-04-15 NOTE — Patient Instructions (Signed)
Medication Instructions:  NO CHANGES- Your physician recommends that you continue on your current medications as directed. Please refer to the Current Medication list given to you today.  If you need a refill on your cardiac medications before your next appointment, please call your pharmacy.  Follow-Up: Your physician wants you to follow-up in: [place]. You should receive a reminder letter in the mail two months in advance. If you do not receive a letter, please call our office 02-2018 to schedule the 04-2018 follow-up appointment.   Thank you for choosing [facility] at [facility]!!

## 2017-04-25 NOTE — Progress Notes (Signed)
Pt has been made aware of normal result and verbalized understanding.  jw 04/25/17

## 2017-04-29 ENCOUNTER — Other Ambulatory Visit: Payer: Self-pay | Admitting: Family Medicine

## 2017-05-02 ENCOUNTER — Encounter: Payer: Self-pay | Admitting: Neurology

## 2017-05-02 ENCOUNTER — Ambulatory Visit (INDEPENDENT_AMBULATORY_CARE_PROVIDER_SITE_OTHER): Payer: Medicare Other | Admitting: Neurology

## 2017-05-02 ENCOUNTER — Other Ambulatory Visit: Payer: Self-pay

## 2017-05-02 VITALS — BP 148/79 | HR 51 | Wt 183.4 lb

## 2017-05-02 DIAGNOSIS — R9401 Abnormal electroencephalogram [EEG]: Secondary | ICD-10-CM

## 2017-05-02 DIAGNOSIS — R55 Syncope and collapse: Secondary | ICD-10-CM | POA: Diagnosis not present

## 2017-05-02 DIAGNOSIS — G459 Transient cerebral ischemic attack, unspecified: Secondary | ICD-10-CM | POA: Diagnosis not present

## 2017-05-02 MED ORDER — LEVETIRACETAM 500 MG PO TABS: 500.0000 mg | ORAL_TABLET | Freq: Two times a day (BID) | ORAL | 0 refills | Status: DC
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
## 2017-05-02 NOTE — Patient Instructions (Addendum)
I had a long discussion with the patient and her granddaughter regarding her recent presentation to the hospital. There appears to be discrepancy between patient's description versus the one provided by EMS. She  presented to  [place] as  possible TIA but had negative neurovascular workup. Abnormal EEG with focal irritability of the left temporal lobe raised possibility of complex partial seizure hence she was started on Keppra which he seems to tolerating well. I recommend she follow-up with her neurologist Dr. Posey Pronto for further decisions about continuing or stopping Keppra. I advised her not to drive as per [facility] and to get driving clearance from Dr. Posey Pronto. No further neurological follow-up visit with me is necessary.

## 2017-05-10 ENCOUNTER — Other Ambulatory Visit: Payer: Self-pay | Admitting: Internal Medicine

## 2017-05-10 DIAGNOSIS — Z1231 Encounter for screening mammogram for malignant neoplasm of breast: Secondary | ICD-10-CM

## 2017-05-29 ENCOUNTER — Ambulatory Visit
Admission: RE | Admit: 2017-05-29 | Discharge: 2017-05-29 | Disposition: A | Discharge status: Home / Self Care | Payer: Medicare Other | Source: Ambulatory Visit | Attending: Internal Medicine | Admitting: Internal Medicine

## 2017-05-29 DIAGNOSIS — Z1231 Encounter for screening mammogram for malignant neoplasm of breast: Secondary | ICD-10-CM

## 2017-06-04 ENCOUNTER — Ambulatory Visit (INDEPENDENT_AMBULATORY_CARE_PROVIDER_SITE_OTHER): Payer: Medicare Other | Admitting: Neurology

## 2017-06-04 ENCOUNTER — Encounter: Payer: Self-pay | Admitting: Neurology

## 2017-06-04 VITALS — BP 150/90 | HR 65 | Ht 64.0 in | Wt 181.2 lb

## 2017-06-04 DIAGNOSIS — G40909 Epilepsy, unspecified, not intractable, without status epilepticus: Secondary | ICD-10-CM

## 2017-06-04 DIAGNOSIS — Z981 Arthrodesis status: Secondary | ICD-10-CM | POA: Diagnosis not present

## 2017-06-04 NOTE — Patient Instructions (Signed)
Continue Keppra 500mg  twice daily  Return to clinic in June 26th at [facility]
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
## 2017-07-31 ENCOUNTER — Encounter

## 2017-07-31 ENCOUNTER — Encounter: Payer: Self-pay | Admitting: Neurology

## 2017-07-31 ENCOUNTER — Ambulatory Visit (INDEPENDENT_AMBULATORY_CARE_PROVIDER_SITE_OTHER): Payer: Medicare Other | Admitting: Neurology

## 2017-07-31 VITALS — BP 130/80 | HR 61 | Ht 64.0 in | Wt 181.5 lb

## 2017-07-31 DIAGNOSIS — R261 Paralytic gait: Secondary | ICD-10-CM | POA: Diagnosis not present

## 2017-07-31 DIAGNOSIS — G40909 Epilepsy, unspecified, not intractable, without status epilepticus: Secondary | ICD-10-CM

## 2017-07-31 DIAGNOSIS — R292 Abnormal reflex: Secondary | ICD-10-CM

## 2017-07-31 DIAGNOSIS — Z981 Arthrodesis status: Secondary | ICD-10-CM

## 2017-07-31 DIAGNOSIS — M5417 Radiculopathy, lumbosacral region: Secondary | ICD-10-CM

## 2017-07-31 MED ORDER — LEVETIRACETAM 500 MG PO TABS: 500.0000 mg | ORAL_TABLET | Freq: Two times a day (BID) | ORAL | 11 refills | Status: DC

## 2017-07-31 NOTE — Patient Instructions (Signed)
1.  MRI lumbar spine without contrast 2.  Continue keppra 500mg  twice daily and gabapentin 300mg  at bedtime 3.  If your tingling gets worse, please call my office and we can increase gabapentin 4.  You are cleared to resume driving  Return to clinic in 6 months
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
## 2017-08-20 ENCOUNTER — Ambulatory Visit
Admission: RE | Admit: 2017-08-20 | Discharge: 2017-08-20 | Disposition: A | Discharge status: Home / Self Care | Payer: Medicare Other | Source: Ambulatory Visit | Attending: Neurology | Admitting: Neurology

## 2017-08-20 DIAGNOSIS — M5417 Radiculopathy, lumbosacral region: Secondary | ICD-10-CM

## 2017-08-20 DIAGNOSIS — R261 Paralytic gait: Secondary | ICD-10-CM

## 2017-08-20 DIAGNOSIS — R292 Abnormal reflex: Secondary | ICD-10-CM

## 2017-08-21 ENCOUNTER — Encounter: Payer: Self-pay | Admitting: *Deleted

## 2017-08-21 ENCOUNTER — Ambulatory Visit: Payer: Medicare Other | Admitting: Podiatry

## 2017-08-21 ENCOUNTER — Telehealth: Payer: Self-pay | Admitting: Neurology

## 2017-08-21 NOTE — Telephone Encounter (Signed)
I spoke with patient and informed her that I will be sending the referral to [facility] and that I will fax the letter to [facility] after Dr. Posey Pronto signs it.

## 2017-08-21 NOTE — Telephone Encounter (Signed)
Called and discussed results of MRI lumbar spine which show worsening multilevel spinal canal and foraminal stenosis, which is causing her left foot weakness and spastic gait.  I will refer her to spine surgery for further evaluation.    MRI lumbar spine 08/20/2017:  L1-2: New right paracentral disc herniation. Mild narrowing of both lateral recesses but without definite neural compression.  L2-3: Right-sided predominant endplate osteophytes, disc protrusion and facet arthropathy. Multifactorial stenosis with crowding of the nerve roots. Potential for focal neural compression in the right lateral recess and intervertebral foramen on the right. Findings have worsened slightly.  L3-4: Multifactorial stenosis and foraminal stenosis slightly worsened since the previous exam.  L4-5: Left lateral recess and foraminal stenosis worsened since the previous exam.  L5-S1: Left-sided prominent disc degeneration and facet degeneration with left foraminal stenosis, similar to the previous exam.   Anna K. Posey Pronto, DO

## 2017-08-22 ENCOUNTER — Encounter: Payer: Self-pay | Admitting: Neurology

## 2017-09-03 ENCOUNTER — Telehealth: Payer: Self-pay | Admitting: *Deleted

## 2017-09-03 NOTE — Telephone Encounter (Signed)
[facility] to check on her appointment.  Left message for someone to call me back.

## 2017-09-03 NOTE — Telephone Encounter (Signed)
-----   Message from Alda Berthold, DO sent at 09/03/2017  2:18 PM EDT ----- Please follow-up on her neurosurgery referral. Thanks.   ----- Message ----- From: Interface, Rad Results In Sent: 08/20/2017   4:16 PM To: Alda Berthold, DO

## 2017-09-03 NOTE — Telephone Encounter (Signed)
[name] from [facility] called and left message that they have tried to contact patient x 2 but she has not called them back.  I called patient and gave her their number so that she can call to schedule the appointment.

## 2017-09-25 ENCOUNTER — Ambulatory Visit: Payer: Medicare Other | Admitting: Neurology

## 2017-09-25 ENCOUNTER — Encounter

## 2018-01-02 IMAGING — MG MM DIAG BREAST TOMO UNI LEFT
6 series · 6 of 14 positions shown · non-contrast
Comparison: Previous exam(s).

CLINICAL DATA: Patient was called back from screening mammogram for
possible mass in the left breast.

EXAM:
DIGITAL DIAGNOSTIC LEFT MAMMOGRAM WITH 3D TOMOSYNTHESIS AND CAD

[L MLO]
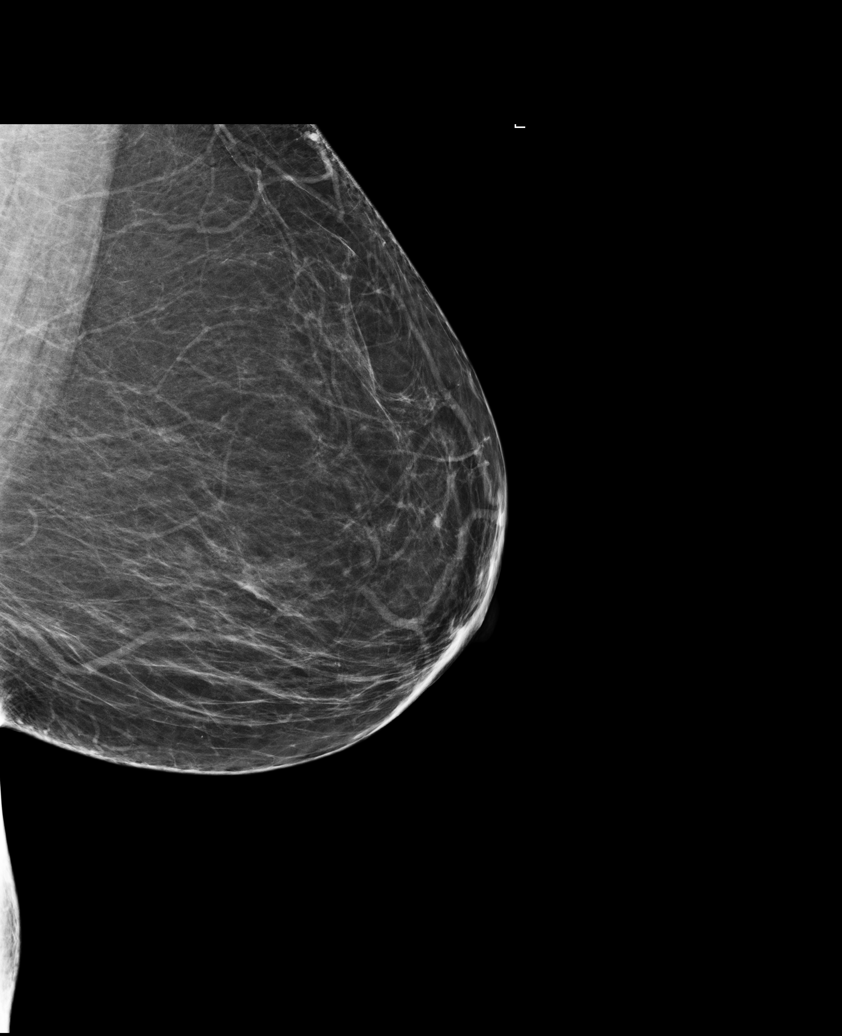

[L CC]
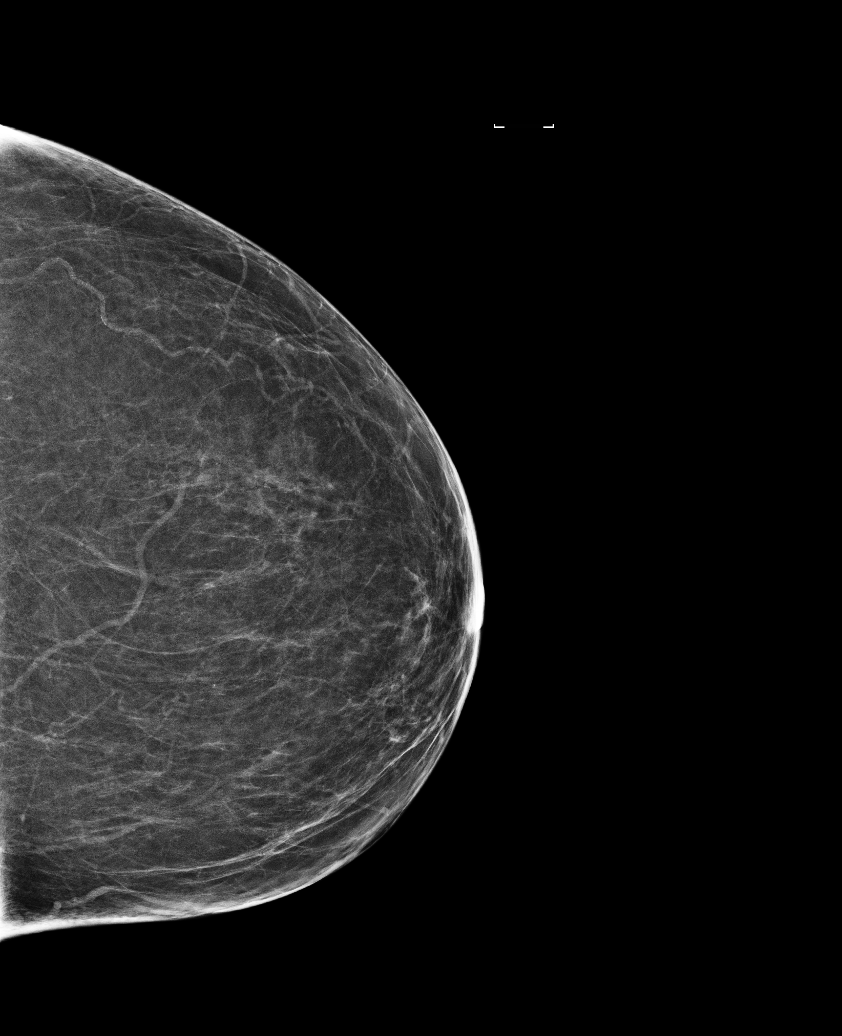

[L CC synth-2D]
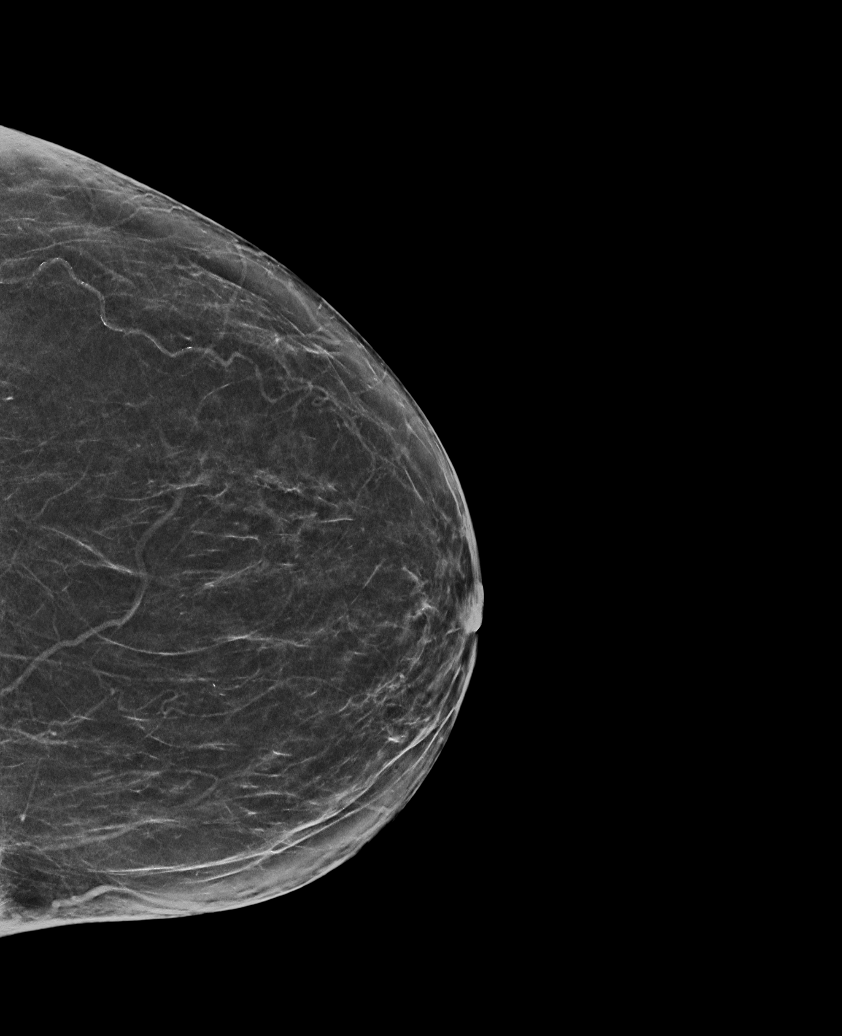

[L MLO synth-2D]
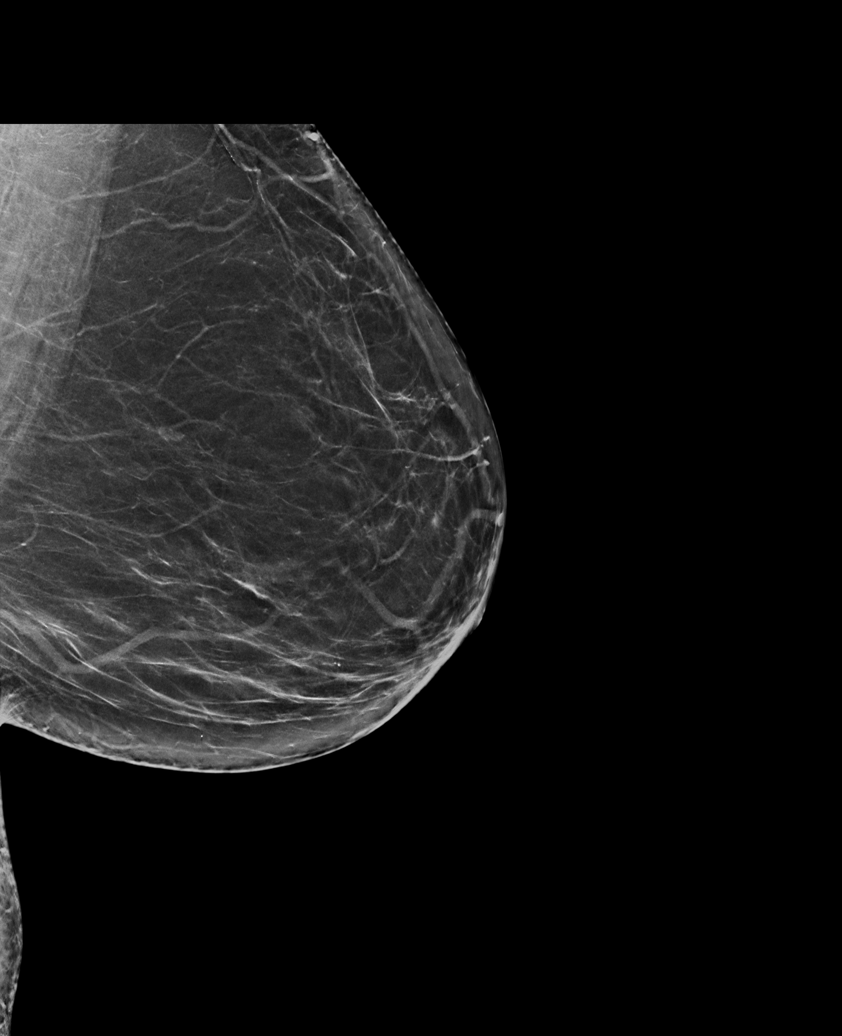

[L MLO tomo · tomo slice 37/74.0]
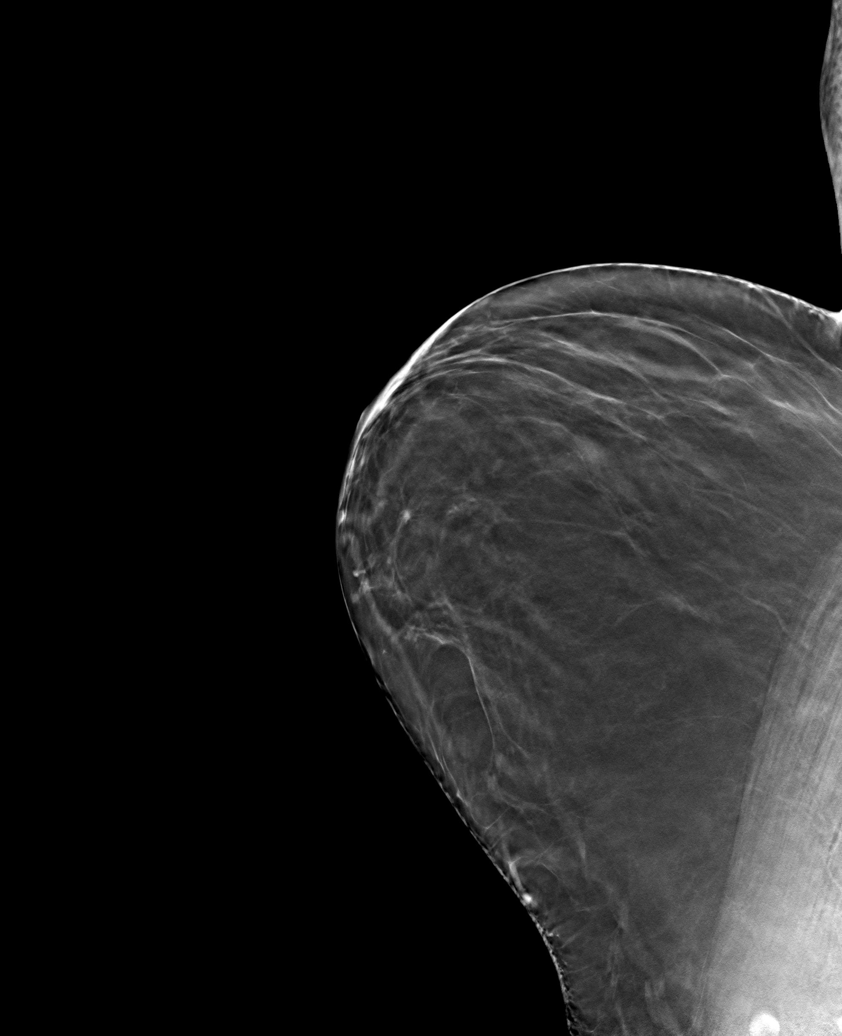

[L CC tomo · tomo slice 33/64.0]
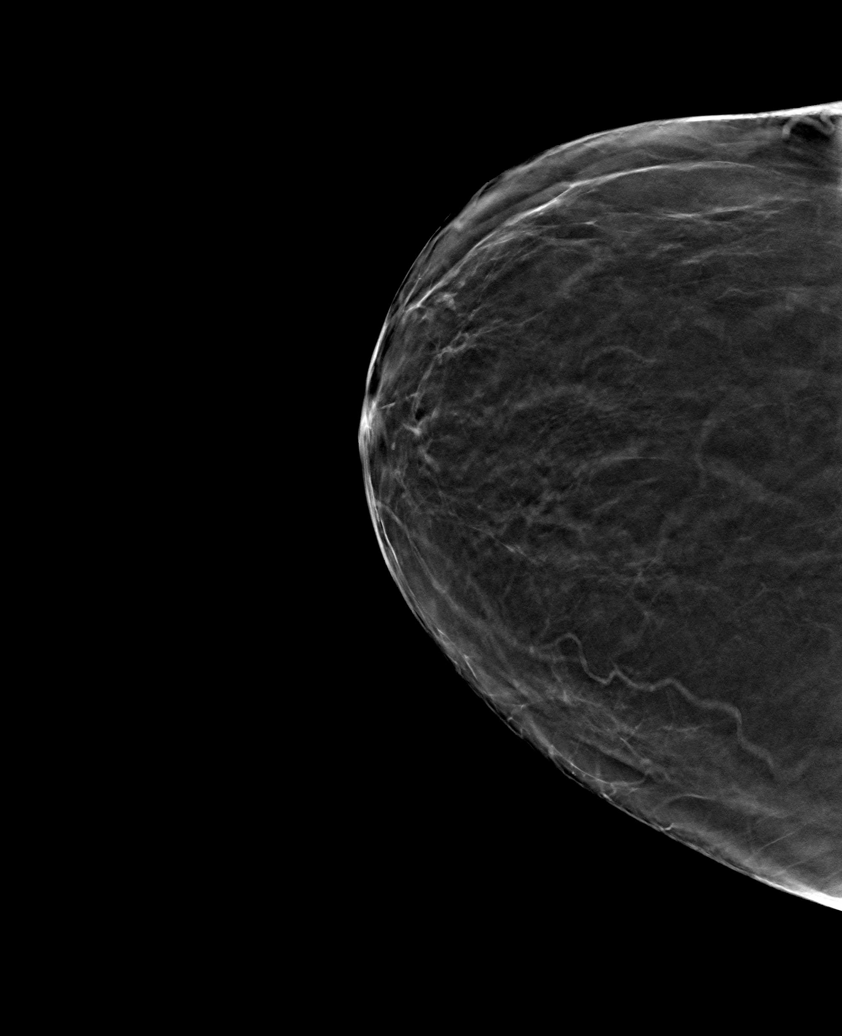

[6 of 14 positions shown; findings below may reference images not displayed]

ACR Breast Density Category b: There are scattered areas of
fibroglandular density.
FINDINGS: No suspicious mass, malignant type microcalcifications or distortion
detected on the additional imaging.

Mammographic images were processed with CAD.
IMPRESSION: No evidence of malignancy in the left breast.

RECOMMENDATION:
Bilateral screening mammogram in 1 year is recommended.

I have discussed the findings and recommendations with the patient.
Results were also provided in writing at the conclusion of the
visit. If applicable, a reminder letter will be sent to the patient
regarding the next appointment.

BI-RADS CATEGORY  1: Negative.

## 2018-01-20 ENCOUNTER — Ambulatory Visit: Payer: Medicare Other | Admitting: Neurology

## 2018-03-13 ENCOUNTER — Encounter

## 2018-03-13 ENCOUNTER — Ambulatory Visit: Payer: Medicare Other | Admitting: Neurology
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
## 2018-04-11 ENCOUNTER — Ambulatory Visit (INDEPENDENT_AMBULATORY_CARE_PROVIDER_SITE_OTHER): Payer: Medicare Other | Admitting: Neurology

## 2018-04-11 ENCOUNTER — Encounter: Payer: Self-pay | Admitting: Neurology

## 2018-04-11 ENCOUNTER — Ambulatory Visit: Payer: Medicare Other | Admitting: Neurology

## 2018-04-11 VITALS — BP 120/80 | HR 59 | Ht 64.0 in | Wt 180.0 lb

## 2018-04-11 DIAGNOSIS — G40909 Epilepsy, unspecified, not intractable, without status epilepticus: Secondary | ICD-10-CM

## 2018-04-11 DIAGNOSIS — M5417 Radiculopathy, lumbosacral region: Secondary | ICD-10-CM | POA: Diagnosis not present

## 2018-04-11 DIAGNOSIS — M21372 Foot drop, left foot: Secondary | ICD-10-CM | POA: Diagnosis not present

## 2018-04-11 NOTE — Patient Instructions (Signed)
Referral to [facility] for left ankle-foot-orthotic  Continue Keppra 500mg  twice daily  Return to clinic in 6 months

## 2018-07-01 ENCOUNTER — Other Ambulatory Visit: Payer: Self-pay

## 2018-07-01 DIAGNOSIS — G40909 Epilepsy, unspecified, not intractable, without status epilepticus: Secondary | ICD-10-CM

## 2018-07-01 MED ORDER — LEVETIRACETAM 500 MG PO TABS: 500.0000 mg | ORAL_TABLET | Freq: Two times a day (BID) | ORAL | 3 refills | Status: DC

## 2018-07-08 ENCOUNTER — Other Ambulatory Visit: Payer: Self-pay | Admitting: Internal Medicine

## 2018-07-08 DIAGNOSIS — Z1231 Encounter for screening mammogram for malignant neoplasm of breast: Secondary | ICD-10-CM

## 2018-08-12 ENCOUNTER — Other Ambulatory Visit: Payer: Self-pay | Admitting: Neurology

## 2018-08-12 DIAGNOSIS — G40909 Epilepsy, unspecified, not intractable, without status epilepticus: Secondary | ICD-10-CM

## 2018-08-20 ENCOUNTER — Other Ambulatory Visit: Payer: Self-pay

## 2018-08-20 ENCOUNTER — Ambulatory Visit
Admission: RE | Admit: 2018-08-20 | Discharge: 2018-08-20 | Disposition: A | Discharge status: Home / Self Care | Payer: Medicare Other | Source: Ambulatory Visit | Attending: Internal Medicine | Admitting: Internal Medicine

## 2018-08-20 DIAGNOSIS — Z1231 Encounter for screening mammogram for malignant neoplasm of breast: Secondary | ICD-10-CM | POA: Insufficient documentation

## 2018-09-02 DIAGNOSIS — E538 Deficiency of other specified B group vitamins: Secondary | ICD-10-CM | POA: Insufficient documentation

## 2018-09-16 ENCOUNTER — Encounter: Payer: Self-pay | Admitting: Neurology

## 2018-09-17 ENCOUNTER — Other Ambulatory Visit: Payer: Self-pay

## 2018-09-17 ENCOUNTER — Telehealth (INDEPENDENT_AMBULATORY_CARE_PROVIDER_SITE_OTHER): Payer: Medicare Other | Admitting: Neurology

## 2018-09-17 VITALS — Ht 64.0 in | Wt 180.0 lb

## 2018-09-17 DIAGNOSIS — G40909 Epilepsy, unspecified, not intractable, without status epilepticus: Secondary | ICD-10-CM

## 2018-09-17 NOTE — Progress Notes (Signed)
    Virtual Visit via Telephone Note The purpose of this virtual visit is to provide medical care while limiting exposure to the novel coronavirus.    Consent was obtained for phone visit:  Yes.   Answered questions that patient had about telehealth interaction:  Yes.   I discussed the limitations, risks, security and privacy concerns of performing an evaluation and management service by telephone. I also discussed with the patient that there may be a patient responsible charge related to this service. The patient expressed understanding and agreed to proceed.  Pt location: Home Physician Location: office Name of referring provider:  Perrin Maltese, MD I connected with Anna Chambers at patients initiation/request on 09/17/2018 at 10:10 AM EDT by telephone and verified that I am speaking with the correct person using two identifiers.  Pt MRN:  [id number] Pt DOB:  12-27-1940   History of Present Illness: This is a 78 year-old female here for follow-up of seizure disorder.  She has not had any interval seizures and has been compliant with Keppra 500mg  BID.  Last seizure was in January 2019 and due to presence of occasional sharp transients over the left temporal region, it was decided to keep her on AED.  She continues to see Dr. Maryjean Ka for [facility] which she gets adequate relief of.  She does not have any new neurological concerns or complaints. She has received DMV medical clearance form and is requesting her providers complete it, so she can continue to drive.  Assessment and Plan:   Seizure disorder, well-controlled.  Her last seizure was in January 2019.    - Continue Keppra 500mg  twice daily  - Patient to provide [facility] form for me to complete. She is neurologically cleared to drive.  Follow Up Instructions:   I discussed the assessment and treatment plan with the patient. The patient was provided an opportunity to ask questions and all were answered. The patient agreed with the plan and  demonstrated an understanding of the instructions.   The patient was advised to call back or seek an in-person evaluation if the symptoms worsen or if the condition fails to improve as anticipated.  Return to clinic in 6 months  Total Time spent in visit with the patient was:  15 min, of which 100% of the time was spent in counseling and/or coordinating care.  Alda Berthold, DO

## 2018-09-24 DIAGNOSIS — Z86010 Personal history of colonic polyps: Secondary | ICD-10-CM | POA: Insufficient documentation

## 2018-09-24 DIAGNOSIS — Z8612 Personal history of poliomyelitis: Secondary | ICD-10-CM | POA: Insufficient documentation
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
## 2018-10-09 ENCOUNTER — Ambulatory Visit: Payer: Medicare Other | Admitting: Cardiovascular Disease

## 2018-10-16 ENCOUNTER — Encounter: Payer: Self-pay | Admitting: Neurology

## 2018-10-20 ENCOUNTER — Ambulatory Visit (INDEPENDENT_AMBULATORY_CARE_PROVIDER_SITE_OTHER): Payer: Medicare Other | Admitting: Neurology

## 2018-10-20 DIAGNOSIS — G40909 Epilepsy, unspecified, not intractable, without status epilepticus: Secondary | ICD-10-CM

## 2018-10-21 NOTE — Progress Notes (Signed)
Rescheduled

## 2018-10-22 ENCOUNTER — Ambulatory Visit: Payer: Medicare Other | Admitting: Neurology

## 2018-11-14 ENCOUNTER — Encounter: Payer: Self-pay | Admitting: Neurology

## 2018-11-19 ENCOUNTER — Other Ambulatory Visit: Payer: Self-pay

## 2018-11-19 DIAGNOSIS — Z20822 Contact with and (suspected) exposure to covid-19: Secondary | ICD-10-CM

## 2018-11-21 LAB — NOVEL CORONAVIRUS, NAA: SARS-CoV-2, NAA: NOT DETECTED

## 2018-11-24 ENCOUNTER — Telehealth: Payer: Self-pay | Admitting: Internal Medicine

## 2018-11-24 NOTE — Telephone Encounter (Signed)
Patient called and received her covid test result

## 2018-12-01 ENCOUNTER — Other Ambulatory Visit
Admission: RE | Admit: 2018-12-01 | Discharge: 2018-12-01 | Disposition: A | Discharge status: Home / Self Care | Payer: Medicare Other | Source: Ambulatory Visit | Attending: Internal Medicine | Admitting: Internal Medicine

## 2018-12-01 ENCOUNTER — Other Ambulatory Visit: Payer: Self-pay

## 2018-12-01 DIAGNOSIS — Z01812 Encounter for preprocedural laboratory examination: Secondary | ICD-10-CM | POA: Diagnosis present

## 2018-12-01 DIAGNOSIS — Z20828 Contact with and (suspected) exposure to other viral communicable diseases: Secondary | ICD-10-CM | POA: Diagnosis not present

## 2018-12-02 LAB — SARS CORONAVIRUS 2 (TAT 6-24 HRS): SARS Coronavirus 2: NEGATIVE

## 2018-12-04 ENCOUNTER — Ambulatory Visit: Payer: Medicare Other | Admitting: Certified Registered"

## 2018-12-04 ENCOUNTER — Other Ambulatory Visit: Payer: Self-pay

## 2018-12-04 ENCOUNTER — Encounter
Admission: RE | Disposition: A | Discharge status: Home / Self Care | Payer: Self-pay | Source: Home / Self Care | Attending: Internal Medicine

## 2018-12-04 ENCOUNTER — Ambulatory Visit
Admission: RE | Admit: 2018-12-04 | Discharge: 2018-12-04 | Disposition: A | Discharge status: Home / Self Care | Payer: Medicare Other | Attending: Internal Medicine | Admitting: Internal Medicine

## 2018-12-04 DIAGNOSIS — Z79899 Other long term (current) drug therapy: Secondary | ICD-10-CM | POA: Insufficient documentation

## 2018-12-04 DIAGNOSIS — D122 Benign neoplasm of ascending colon: Secondary | ICD-10-CM | POA: Insufficient documentation

## 2018-12-04 DIAGNOSIS — Z90710 Acquired absence of both cervix and uterus: Secondary | ICD-10-CM | POA: Insufficient documentation

## 2018-12-04 DIAGNOSIS — R569 Unspecified convulsions: Secondary | ICD-10-CM | POA: Insufficient documentation

## 2018-12-04 DIAGNOSIS — I10 Essential (primary) hypertension: Secondary | ICD-10-CM | POA: Diagnosis not present

## 2018-12-04 DIAGNOSIS — Z7982 Long term (current) use of aspirin: Secondary | ICD-10-CM | POA: Insufficient documentation

## 2018-12-04 DIAGNOSIS — Z888 Allergy status to other drugs, medicaments and biological substances status: Secondary | ICD-10-CM | POA: Insufficient documentation

## 2018-12-04 DIAGNOSIS — D120 Benign neoplasm of cecum: Secondary | ICD-10-CM | POA: Insufficient documentation

## 2018-12-04 DIAGNOSIS — Z86010 Personal history of colonic polyps: Secondary | ICD-10-CM | POA: Insufficient documentation

## 2018-12-04 DIAGNOSIS — Z1211 Encounter for screening for malignant neoplasm of colon: Secondary | ICD-10-CM | POA: Insufficient documentation

## 2018-12-04 DIAGNOSIS — F329 Major depressive disorder, single episode, unspecified: Secondary | ICD-10-CM | POA: Insufficient documentation

## 2018-12-04 DIAGNOSIS — Z791 Long term (current) use of non-steroidal anti-inflammatories (NSAID): Secondary | ICD-10-CM | POA: Insufficient documentation

## 2018-12-04 DIAGNOSIS — K640 First degree hemorrhoids: Secondary | ICD-10-CM | POA: Diagnosis not present

## 2018-12-04 DIAGNOSIS — M5412 Radiculopathy, cervical region: Secondary | ICD-10-CM | POA: Diagnosis not present

## 2018-12-04 DIAGNOSIS — K5730 Diverticulosis of large intestine without perforation or abscess without bleeding: Secondary | ICD-10-CM | POA: Diagnosis not present

## 2018-12-04 DIAGNOSIS — G4730 Sleep apnea, unspecified: Secondary | ICD-10-CM | POA: Diagnosis not present

## 2018-12-04 HISTORY — PX: COLONOSCOPY WITH PROPOFOL: SHX5780

## 2018-12-04 HISTORY — DX: Unspecified convulsions: R56.9

## 2018-12-04 SURGERY — COLONOSCOPY WITH PROPOFOL
Anesthesia: General

## 2018-12-04 MED ORDER — SODIUM CHLORIDE 0.9 % IV SOLN
INTRAVENOUS | Status: DC
  Administered 2018-12-04: 10:00:00 via INTRAVENOUS

## 2018-12-04 MED ORDER — PROPOFOL 10 MG/ML IV BOLUS
INTRAVENOUS | Status: AC
  Filled 2018-12-04: qty 20

## 2018-12-04 MED ORDER — PROPOFOL 10 MG/ML IV BOLUS
INTRAVENOUS | Status: DC | PRN
  Administered 2018-12-04: 40 mg via INTRAVENOUS

## 2018-12-04 MED ORDER — PROPOFOL 500 MG/50ML IV EMUL
INTRAVENOUS | Status: DC | PRN
  Administered 2018-12-04: 150 ug/kg/min via INTRAVENOUS

## 2018-12-04 NOTE — Anesthesia Preprocedure Evaluation (Addendum)
Anesthesia Evaluation  Patient identified by MRN, date of birth, ID band Patient awake    Reviewed: Allergy & Precautions, H&P , NPO status , Patient's Chart, lab work & pertinent test results  Airway Mallampati: II  TM Distance: >3 FB     Dental  (+) Teeth Intact, Implants   Pulmonary sleep apnea ,           Cardiovascular hypertension,   H/o syncope and collapse - normal echo Jan 2019, normal 30-day Holter monitoring Feb 2019.  Suspected seizure.   Neuro/Psych Seizures - (suspected seizure causing syncopal event Jan 2019, on AEDs),  PSYCHIATRIC DISORDERS Depression Cervical radiculopathy    GI/Hepatic negative GI ROS, Neg liver ROS,   Endo/Other  negative endocrine ROS  Renal/GU negative Renal ROS  negative genitourinary   Musculoskeletal   Abdominal   Peds  Hematology negative hematology ROS (+)   Anesthesia Other Findings Past Medical History: No date: Cervical myelopathy with cervical radiculopathy No date: Depression No date: Hypertension No date: Seizure [facility]) No date: Sleep apnea 02/25/2017: Syncope and collapse  Past Surgical History: No date: CATARACT EXTRACTION No date: COLONOSCOPY No date: EYE SURGERY No date: FOOT SURGERY; Left No date: NECK SURGERY No date: SPINE SURGERY     Comment:  ACDF C3-4 No date: VAGINAL HYSTERECTOMY  BMI    Body Mass Index: 30.90 kg/m      Reproductive/Obstetrics negative OB ROS                            Anesthesia Physical Anesthesia Plan  ASA: II  Anesthesia Plan: General   Post-op Pain Management:    Induction:   PONV Risk Score and Plan: Propofol infusion and TIVA  Airway Management Planned:   Additional Equipment:   Intra-op Plan:   Post-operative Plan:   Informed Consent: I have reviewed the patients History and Physical, chart, labs and discussed the procedure including the risks, benefits and alternatives for the  proposed anesthesia with the patient or authorized representative who has indicated his/her understanding and acceptance.     Dental Advisory Given  Plan Discussed with: Anesthesiologist, CRNA and Surgeon  Anesthesia Plan Comments:         Anesthesia Quick Evaluation

## 2018-12-04 NOTE — Op Note (Signed)
[facility] Gastroenterology Patient Name: Anna Chambers Procedure Date: 12/04/2018 10:17 AM MRN: [id number] Account #: [id number] Date of Birth: 1940-04-05 Admit Type: Outpatient Age: 78 Room: [facility] ENDO ROOM 2 Gender: Female Note Status: Finalized Procedure:            Colonoscopy Indications:          Surveillance: Personal history of adenomatous polyps on                        last colonoscopy > 5 years ago Providers:            Lorie Apley K. Toledo MD, MD Medicines:            Propofol per Anesthesia Complications:        No immediate complications. Procedure:            Pre-Anesthesia Assessment:                       - The risks and benefits of the procedure and the                        sedation options and risks were discussed with the                        patient. All questions were answered and informed                        consent was obtained.                       - Patient identification and proposed procedure were                        verified prior to the procedure by the nurse. The                        procedure was verified in the procedure room.                       - ASA Grade Assessment: III - A patient with severe                        systemic disease.                       - After reviewing the risks and benefits, the patient                        was deemed in satisfactory condition to undergo the                        procedure.                       After obtaining informed consent, the colonoscope was                        passed under direct vision. Throughout the procedure,                        the patient's blood pressure, pulse, and oxygen  saturations were monitored continuously. The                        Colonoscope was introduced through the anus and                        advanced to the the cecum, identified by appendiceal                        orifice and ileocecal valve. The colonoscopy  was                        performed without difficulty. The patient tolerated the                        procedure well. The quality of the bowel preparation                        was good. The ileocecal valve, appendiceal orifice, and                        rectum were photographed. Findings:      The perianal and digital rectal examinations were normal. Pertinent       negatives include normal sphincter tone and no palpable rectal lesions.      Many small-mouthed diverticula were found in the sigmoid colon.      Two sessile polyps were found in the ascending colon and cecum. The       polyps were 4 to 5 mm in size. These polyps were removed with a jumbo       cold forceps. Resection and retrieval were complete.      Non-bleeding internal hemorrhoids were found during retroflexion. The       hemorrhoids were Grade I (internal hemorrhoids that do not prolapse).      The exam was otherwise without abnormality. Impression:           - Diverticulosis in the sigmoid colon.                       - Two 4 to 5 mm polyps in the ascending colon and in                        the cecum, removed with a jumbo cold forceps. Resected                        and retrieved.                       - Non-bleeding internal hemorrhoids.                       - The examination was otherwise normal. Recommendation:       - Patient has a contact number available for                        emergencies. The signs and symptoms of potential                        delayed complications were discussed with the patient.  Return to normal activities tomorrow. Written discharge                        instructions were provided to the patient.                       - Resume previous diet.                       - Continue present medications.                       - Await pathology results.                       - If polyps are benign or adenomatous without                        dysplasia, I will  advise NO further colonoscopy due to                        advanced age and/or severe comorbidity.                       - The findings and recommendations were discussed with                        the patient. Procedure Code(s):    --- Professional ---                       [phone], Colonoscopy, flexible; with biopsy, single or                        multiple Diagnosis Code(s):    --- Professional ---                       K57.30, Diverticulosis of large intestine without                        perforation or abscess without bleeding                       K63.5, Polyp of colon                       K64.0, First degree hemorrhoids                       Z86.010, Personal history of colonic polyps CPT copyright 2019 American Medical Association. All rights reserved. The codes documented in this report are preliminary and upon coder review may  be revised to meet current compliance requirements. Efrain Sella MD, MD 12/04/2018 10:54:27 AM This report has been signed electronically. Number of Addenda: 0 Note Initiated On: 12/04/2018 10:17 AM Scope Withdrawal Time: 0 hours 7 minutes 11 seconds  Total Procedure Duration: 0 hours 11 minutes 8 seconds  Estimated Blood Loss: Estimated blood loss: none.      [facility]

## 2018-12-04 NOTE — Anesthesia Post-op Follow-up Note (Signed)
Anesthesia QCDR form completed.        

## 2018-12-04 NOTE — H&P (Signed)
Outpatient short stay form Pre-procedure 12/04/2018 10:26 AM Treena Cosman K. Alice Reichert, M.D.  Primary Physician: Lamonte Sakai, M.D.  Reason for visit:  Personal hx of adenomatous colon polyps x 1 in 2015.  History of present illness:                            Patient presents for colonoscopy for a personal hx of colon polyps. The patient denies abdominal pain, abnormal weight loss or rectal bleeding.     Current Facility-Administered Medications:  .  0.9 %  sodium chloride infusion, , Intravenous, Continuous, Kennedy, Benay Pike, MD, Last Rate: 20 mL/hr at 12/04/18 1008  Facility-Administered Medications Prior to Admission  Medication Dose Route Frequency Provider Last Rate Last Dose  . betamethasone acetate-betamethasone sodium phosphate (CELESTONE) injection 3 mg  3 mg Intramuscular Once Edrick Kins, DPM       Medications Prior to Admission  Medication Sig Dispense Refill Last Dose  . allopurinol (ZYLOPRIM) 100 MG tablet    12/04/2018 at Unknown time  . aspirin 81 MG tablet Take 81 mg by mouth daily.   Past Month at Unknown time  . b complex vitamins tablet Take 1 tablet by mouth daily.   Past Week at Unknown time  . bisoprolol-hydrochlorothiazide (ZIAC) 2.5-6.25 MG per tablet Take 1 tablet by mouth daily.   12/04/2018 at Unknown time  . brimonidine-timolol (COMBIGAN) 0.2-0.5 % ophthalmic solution Administer 10 drops to both eyes Two (2) times a day. Frequency:PHARMDIR   Dosage:12ml     Instructions:  Note:Instill one drop in each eye twice daily. Dose: 1   12/04/2018 at Unknown time  . brinzolamide (AZOPT) 1 % ophthalmic suspension Administer 1 drop to both eyes Two (2) times a day (at 8am and 5pm). Frequency:PHARMDIR   Dosage:0.0     Instructions:  Note:Instill one drop in each eye twice a day. Dose: 1   12/04/2018 at Unknown time  . CHOLECALCIFEROL PO Take 1,000 Units by mouth.   Past Week at Unknown time  . gabapentin (NEURONTIN) 300 MG capsule Take 1 capsule (300 mg total) by mouth at  bedtime. 30 capsule 0 12/03/2018 at Unknown time  . ketorolac (ACULAR) 0.5 % ophthalmic solution INT 1 GTT IN OD QID   12/03/2018 at Unknown time  . latanoprost (XALATAN) 0.005 % ophthalmic solution Administer 2.5 mL to both eyes nightly. Frequency:QD   Dosage:0.0     Instructions:  Note:Dose: 0.005 %   12/03/2018 at Unknown time  . levETIRAcetam (KEPPRA) 500 MG tablet TAKE 1 TABLET BY MOUTH TWICE A DAY 180 tablet 1 12/04/2018 at Unknown time  . meloxicam (MOBIC) 15 MG tablet Take 15 mg by mouth daily.   12/03/2018 at Unknown time  . Omega-3 Fatty Acids (OMEGA-3 FISH OIL) 300 MG CAPS Take by mouth.   Past Week at Unknown time  . oxybutynin (DITROPAN-XL) 5 MG 24 hr tablet TK 1 T PO  QD  1 12/04/2018 at Unknown time  . pilocarpine (PILOCAR) 1 % ophthalmic solution 1 drop 4 (four) times daily.   12/03/2018 at Unknown time  . prednisoLONE acetate (PRED FORTE) 1 % ophthalmic suspension Place 1 drop into the right eye 4 (four) times daily.   12/03/2018 at Unknown time  . TRILYTE 420 g solution    12/03/2018 at Unknown time  . allopurinol (ZYLOPRIM) 300 MG tablet Take 300 mg by mouth daily.        Allergies  Allergen Reactions  .  Simvastatin Rash     Past Medical History:  Diagnosis Date  . Cervical myelopathy with cervical radiculopathy   . Depression   . Hypertension   . Seizure ([place])   . Sleep apnea   . Syncope and collapse 02/25/2017    Review of systems:  Otherwise negative.    Physical Exam  Gen: Alert, oriented. Appears stated age.  HEENT: [place]/AT. PERRLA. Lungs: CTA, no wheezes. CV: RR nl S1, S2. Abd: soft, benign, no masses. BS+ Ext: No edema. Pulses 2+    Planned procedures: Proceed with colonoscopy. The patient understands the nature of the planned procedure, indications, risks, alternatives and potential complications including but not limited to bleeding, infection, perforation, damage to internal organs and possible oversedation/side effects from anesthesia. The  patient agrees and gives consent to proceed.  Please refer to procedure notes for findings, recommendations and patient disposition/instructions.     Litsy Epting K. Alice Reichert, M.D. Gastroenterology 12/04/2018  10:26 AM

## 2018-12-04 NOTE — Anesthesia Postprocedure Evaluation (Signed)
Anesthesia Post Note  Patient: Anna Chambers  Procedure(s) Performed: COLONOSCOPY WITH PROPOFOL (N/A )  Patient location during evaluation: PACU Anesthesia Type: General Level of consciousness: awake and alert Pain management: pain level controlled Vital Signs Assessment: post-procedure vital signs reviewed and stable Respiratory status: spontaneous breathing, nonlabored ventilation and respiratory function stable Cardiovascular status: blood pressure returned to baseline and stable Postop Assessment: no apparent nausea or vomiting Anesthetic complications: no     Last Vitals:  Vitals:   12/04/18 1105 12/04/18 1115  BP: 106/63 (!) 151/70  Pulse: (!) 55 (!) 56  Resp: 15 15  Temp:    SpO2: 98% 100%    Last Pain:  Vitals:   12/04/18 1115  TempSrc:   PainSc: 0-No pain                 Durenda Hurt

## 2018-12-04 NOTE — Transfer of Care (Signed)
Immediate Anesthesia Transfer of Care Note  Patient: Anna Chambers  Procedure(s) Performed: COLONOSCOPY WITH PROPOFOL (N/A )  Patient Location: Endoscopy Unit  Anesthesia Type:General  Level of Consciousness: awake, alert  and oriented  Airway & Oxygen Therapy: Patient Spontanous Breathing and Patient connected to nasal cannula oxygen  Post-op Assessment: Report given to RN and Post -op Vital signs reviewed and stable  Post vital signs: Reviewed and stable  Last Vitals:  Vitals Value Taken Time  BP    Temp    Pulse    Resp    SpO2      Last Pain:  Vitals:   12/04/18 0955  TempSrc: Oral  PainSc: 0-No pain         Complications: No apparent anesthesia complications

## 2018-12-04 NOTE — Interval H&P Note (Signed)
History and Physical Interval Note:  12/04/2018 10:27 AM  Anna Chambers  has presented today for surgery, with the diagnosis of PERSONAL HX.OF COLON POLYPS.  The various methods of treatment have been discussed with the patient and family. After consideration of risks, benefits and other options for treatment, the patient has consented to  Procedure(s): COLONOSCOPY WITH PROPOFOL (N/A) as a surgical intervention.  The patient's history has been reviewed, patient examined, no change in status, stable for surgery.  I have reviewed the patient's chart and labs.  Questions were answered to the patient's satisfaction.     [place], [place]

## 2018-12-05 ENCOUNTER — Encounter: Payer: Self-pay | Admitting: Internal Medicine

## 2018-12-05 LAB — SURGICAL PATHOLOGY

## 2019-01-15 ENCOUNTER — Other Ambulatory Visit: Payer: Self-pay

## 2019-01-15 DIAGNOSIS — Z20822 Contact with and (suspected) exposure to covid-19: Secondary | ICD-10-CM

## 2019-01-17 LAB — NOVEL CORONAVIRUS, NAA: SARS-CoV-2, NAA: NOT DETECTED

## 2019-01-22 DIAGNOSIS — N189 Chronic kidney disease, unspecified: Secondary | ICD-10-CM | POA: Insufficient documentation

## 2019-01-22 DIAGNOSIS — I10 Essential (primary) hypertension: Secondary | ICD-10-CM | POA: Insufficient documentation

## 2019-01-22 DIAGNOSIS — G4730 Sleep apnea, unspecified: Secondary | ICD-10-CM | POA: Insufficient documentation

## 2019-01-22 HISTORY — DX: Chronic kidney disease, unspecified: N18.9

## 2019-03-19 ENCOUNTER — Encounter: Payer: Self-pay | Admitting: Neurology

## 2019-03-25 ENCOUNTER — Ambulatory Visit: Payer: Medicare Other | Admitting: Neurology

## 2019-03-26 ENCOUNTER — Ambulatory Visit: Payer: Medicare Other | Admitting: Neurology

## 2019-04-16 ENCOUNTER — Telehealth (INDEPENDENT_AMBULATORY_CARE_PROVIDER_SITE_OTHER): Payer: Medicare Other | Admitting: Cardiology

## 2019-04-16 ENCOUNTER — Encounter: Payer: Self-pay | Admitting: Cardiology

## 2019-04-16 VITALS — BP 121/73 | HR 66 | Ht 64.0 in | Wt 171.0 lb

## 2019-04-16 DIAGNOSIS — R55 Syncope and collapse: Secondary | ICD-10-CM | POA: Diagnosis not present

## 2019-04-16 NOTE — Patient Instructions (Signed)
Medication Instructions:  Your physician recommends that you continue on your current medications as directed. Please refer to the Current Medication list given to you today.  *If you need a refill on your cardiac medications before your next appointment, please call your pharmacy*   Follow-Up: At [facility], you and your health needs are our priority.  As part of our continuing mission to provide you with exceptional heart care, we have created designated Provider Care Teams.  These Care Teams include your primary Cardiologist (physician) and Advanced Practice Providers (APPs -  Physician Assistants and Nurse Practitioners) who all work together to provide you with the care you need, when you need it.  We recommend signing up for the patient portal called "MyChart".  Sign up information is provided on this After Visit Summary.  MyChart is used to connect with patients for Virtual Visits (Telemedicine).  Patients are able to view lab/test results, encounter notes, upcoming appointments, etc.  Non-urgent messages can be sent to your provider as well.   To learn more about what you can do with MyChart, go to [email or web address].    Your next appointment:   Please follow up with [place] as needed.

## 2019-04-16 NOTE — Progress Notes (Signed)
Virtual Visit via Telephone Note   This visit type was conducted due to national recommendations for restrictions regarding the COVID-19 Pandemic (e.g. social distancing) in an effort to limit this patient's exposure and mitigate transmission in our community.  Due to her co-morbid illnesses, this patient is at least at moderate risk for complications without adequate follow up.  This format is felt to be most appropriate for this patient at this time.  The patient did not have access to video technology/had technical difficulties with video requiring transitioning to audio format only (telephone).  All issues noted in this document were discussed and addressed.  No physical exam could be performed with this format.  Please refer to the patient's chart for her  consent to telehealth for [facility].   The patient was identified using 2 identifiers.  Date:  04/16/2019   ID:  Anna Chambers, DOB August 17, 1940, MRN [id number]  Patient Location: Home Provider Location: Home  PCP:  Perrin Maltese, MD  Cardiologist:  Dr Debara Pickett Electrophysiologist:  None   Evaluation Performed:  Follow-Up Visit  Chief Complaint:  none  History of Present Illness:    Anna Chambers is a 79 y.o. female with a history of syncope in 2019.  Echocardiogram showed normal LV function.  Ultimately was felt that her syncope was secondary to seizures.  She is on medications for this now.  She was contacted for routine follow-up.  She is done well since we last saw her, she has not had recurrent syncope.  She follows regularly with her PCP.  She did have COVID in January of this year but did not require hospitalization.  The patient does not have symptoms concerning for COVID-19 infection (fever, chills, cough, or new shortness of breath).    Past Medical History:  Diagnosis Date  . Cervical myelopathy with cervical radiculopathy   . Depression   . Hypertension   . Seizure ([place])   . Sleep apnea   . Syncope and collapse  02/25/2017   Past Surgical History:  Procedure Laterality Date  . CATARACT EXTRACTION    . COLONOSCOPY    . COLONOSCOPY WITH PROPOFOL N/A 12/04/2018   Procedure: COLONOSCOPY WITH PROPOFOL;  Surgeon: Toledo, Benay Pike, MD;  Location: [facility] ENDOSCOPY;  Service: Gastroenterology;  Laterality: N/A;  . EYE SURGERY    . FOOT SURGERY Left   . NECK SURGERY    . SPINE SURGERY     ACDF C3-4  . VAGINAL HYSTERECTOMY       Current Meds  Medication Sig  . allopurinol (ZYLOPRIM) 100 MG tablet   . allopurinol (ZYLOPRIM) 300 MG tablet Take 300 mg by mouth daily.  Marland Kitchen aspirin 81 MG tablet Take 81 mg by mouth daily.  Marland Kitchen b complex vitamins tablet Take 1 tablet by mouth daily.  . bisoprolol-hydrochlorothiazide (ZIAC) 2.5-6.25 MG per tablet Take 1 tablet by mouth daily.  . brimonidine-timolol (COMBIGAN) 0.2-0.5 % ophthalmic solution Administer 10 drops to both eyes Two (2) times a day. Frequency:PHARMDIR   Dosage:75ml     Instructions:  Note:Instill one drop in each eye twice daily. Dose: 1  . brinzolamide (AZOPT) 1 % ophthalmic suspension Administer 1 drop to both eyes Two (2) times a day (at 8am and 5pm). Frequency:PHARMDIR   Dosage:0.0     Instructions:  Note:Instill one drop in each eye twice a day. Dose: 1  . CHOLECALCIFEROL PO Take 1,000 Units by mouth.  . gabapentin (NEURONTIN) 300 MG capsule Take 1 capsule (300 mg  total) by mouth at bedtime.  Marland Kitchen ketorolac (ACULAR) 0.5 % ophthalmic solution INT 1 GTT IN OD QID  . latanoprost (XALATAN) 0.005 % ophthalmic solution Administer 2.5 mL to both eyes nightly. Frequency:QD   Dosage:0.0     Instructions:  Note:Dose: 0.005 %  . levETIRAcetam (KEPPRA) 500 MG tablet TAKE 1 TABLET BY MOUTH TWICE A DAY  . meloxicam (MOBIC) 15 MG tablet Take 15 mg by mouth daily.  . Omega-3 Fatty Acids (OMEGA-3 FISH OIL) 300 MG CAPS Take by mouth.  . oxybutynin (DITROPAN-XL) 5 MG 24 hr tablet TK 1 T PO  QD  . pilocarpine (PILOCAR) 1 % ophthalmic solution 1 drop 4 (four) times daily.   . prednisoLONE acetate (PRED FORTE) 1 % ophthalmic suspension Place 1 drop into the right eye 4 (four) times daily.  . TRILYTE 420 g solution    Current Facility-Administered Medications for the 04/16/19 encounter (Telemedicine) with Erlene Quan, PA-C  Medication  . betamethasone acetate-betamethasone sodium phosphate (CELESTONE) injection 3 mg     Allergies:   Simvastatin   Social History   Tobacco Use  . Smoking status: Never Smoker  . Smokeless tobacco: Never Used  Substance Use Topics  . Alcohol use: No    Alcohol/week: 0.0 standard drinks  . Drug use: No     Family Hx: The patient's family history includes Cancer in her father; Healthy in her son; Heart disease in her mother; Hypertension in her father, mother, sister, and sister; Kidney failure in her mother. There is no history of Breast cancer.  ROS:   Please see the history of present illness.    All other systems reviewed and are negative.   Prior CV studies:   The following studies were reviewed today: Echo 2019  Labs/Other Tests and Data Reviewed:    EKG:  An ECG dated 02/26/2017 was personally reviewed today and demonstrated:  NSR- HR 50  Recent Labs: No results found for requested labs within last 8760 hours.   Recent Lipid Panel Lab Results  Component Value Date/Time   CHOL 196 02/27/2017 02:37 AM   TRIG 69 02/27/2017 02:37 AM   HDL 49 02/27/2017 02:37 AM   CHOLHDL 4.0 02/27/2017 02:37 AM   LDLCALC 133 (H) 02/27/2017 02:37 AM    Wt Readings from Last 3 Encounters:  04/16/19 171 lb (77.6 kg)  12/04/18 180 lb (81.6 kg)  09/16/18 180 lb (81.6 kg)     Objective:    Vital Signs:  BP 121/73   Pulse 66   Ht 5\' 4"  (1.626 m)   Wt 171 lb (77.6 kg)   LMP  (LMP Unknown)   BMI 29.35 kg/m    VITAL SIGNS:  reviewed  ASSESSMENT & PLAN:    H/O Syncope- Felt to be secondary to seizures  H/O COVID- Jan 2021- did not require hospitalization  Bradycardia- On low dose Ziac- apparently  asymptomatic  Plan: cardiology f/u PRN   COVID-19 Education: The signs and symptoms of COVID-19 were discussed with the patient and how to seek care for testing (follow up with PCP or arrange E-visit).  The importance of social distancing was discussed today.  Time:   Today, I have spent 10 minutes with the patient with telehealth technology discussing the above problems.     Medication Adjustments/Labs and Tests Ordered: Current medicines are reviewed at length with the patient today.  Concerns regarding medicines are outlined above.   Tests Ordered: No orders of the defined types were placed in this  encounter.   Medication Changes: No orders of the defined types were placed in this encounter.   Follow Up:  Either In Person or Virtual prn  Signed, Kerin Ransom, PA-C  04/16/2019 10:27 AM    [place]

## 2019-04-17 ENCOUNTER — Encounter: Payer: Self-pay | Admitting: Neurology

## 2019-04-17 DIAGNOSIS — F329 Major depressive disorder, single episode, unspecified: Secondary | ICD-10-CM | POA: Insufficient documentation

## 2019-04-17 DIAGNOSIS — H409 Unspecified glaucoma: Secondary | ICD-10-CM | POA: Insufficient documentation

## 2019-04-17 DIAGNOSIS — M810 Age-related osteoporosis without current pathological fracture: Secondary | ICD-10-CM | POA: Insufficient documentation

## 2019-04-17 DIAGNOSIS — E785 Hyperlipidemia, unspecified: Secondary | ICD-10-CM | POA: Insufficient documentation

## 2019-04-17 DIAGNOSIS — M5106 Intervertebral disc disorders with myelopathy, lumbar region: Secondary | ICD-10-CM | POA: Insufficient documentation

## 2019-04-17 DIAGNOSIS — M5126 Other intervertebral disc displacement, lumbar region: Secondary | ICD-10-CM | POA: Insufficient documentation

## 2019-04-17 DIAGNOSIS — F32A Depression, unspecified: Secondary | ICD-10-CM | POA: Insufficient documentation

## 2019-04-20 ENCOUNTER — Other Ambulatory Visit: Payer: Self-pay

## 2019-04-20 ENCOUNTER — Telehealth (INDEPENDENT_AMBULATORY_CARE_PROVIDER_SITE_OTHER): Payer: Medicare Other | Admitting: Neurology

## 2019-04-20 VITALS — Ht 64.0 in | Wt 177.0 lb

## 2019-04-20 DIAGNOSIS — Z981 Arthrodesis status: Secondary | ICD-10-CM

## 2019-04-20 DIAGNOSIS — G40909 Epilepsy, unspecified, not intractable, without status epilepticus: Secondary | ICD-10-CM | POA: Diagnosis not present

## 2019-04-20 DIAGNOSIS — M5417 Radiculopathy, lumbosacral region: Secondary | ICD-10-CM

## 2019-04-20 NOTE — Progress Notes (Signed)
   Due to the COVID-19 crisis, this virtual check-in visit was done via telephone from my office and it was initiated and consent given by this patient and or family.   Telephone (Audio) Visit The purpose of this telephone visit is to provide medical care while limiting exposure to the novel coronavirus.    Consent was obtained for telephone visit and initiated by pt/family:  Yes.   Answered questions that patient had about telehealth interaction:  Yes.   I discussed the limitations, risks, security and privacy concerns of performing an evaluation and management service by telephone. I also discussed with the patient that there may be a patient responsible charge related to this service. The patient expressed understanding and agreed to proceed.  Pt location: Home Physician Location: office Name of referring provider:  Perrin Maltese, MD I connected with .Anna Chambers at patients initiation/request on 04/20/2019 at 10:50 AM EDT by telephone and verified that I am speaking with the correct person using two identifiers.  Pt MRN:  [id number] Pt DOB:  1940-07-22   History of Present Illness: This is a 78 year-old female with history of cervical myelopathy s/p ACDF C3-4 (2014) and lumbar spondylosis with left foot drop returning for follow-up of seizure disorder.  She has been doing well from a neurological standpoint and reports no interval seizures.  She is compliant on Keppra 500mg  BID and tolerating it well.  She continues to have numbness/tingling of the fingers, which has been ongoing since 2014 stemming from cervical canal stenosis.  She also has left foot drop from L5 radiculopathy and has advanced lumbar spondylosis.  She is followed by Dr. Maryjean Ka for low back pain.   She had COVID in January and recovered well.     Assessment and Plan:   1.  Seizure disorder, stable and seizure-free since 2019.  - Well-controlled on Keppra 500mg  BID  - She had questions whether Keppra would be lifelong  and I mentioned that with her left temporal irritability on EEG, prior to making that decision, repeat EEG would be indicated.  Only if normal, would we consider tapering medication.  During this time, she cannot drive for 6 months.    - At this time, I recommend that she stay on therapy and she is in agreemennt  2.  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   - She is followed by Dr. Maryjean Ka for pain management  3.  Cervical myelopathy s/p ACDF at C3-4 with ongoing hand paresthesias.  It was explained that this will be permanent sensory deficits.   She remains on gabapentin 300mg  at bedtime.    Follow Up Instructions:   I discussed the assessment and treatment plan with the patient. The patient was provided an opportunity to ask questions and all were answered. The patient agreed with the plan and demonstrated an understanding of the instructions.   The patient was advised to call back or seek an in-person evaluation if the symptoms worsen or if the condition fails to improve as anticipated.  Return to clinic in 9 months  Total Time spent in visit with the patient was:  9 min, of which 100% of the time was spent in counseling and/or coordinating care.   Pt understands and agrees with the plan of care outlined.     Alda Berthold, DO

## 2019-05-11 ENCOUNTER — Other Ambulatory Visit: Payer: Self-pay | Admitting: Neurology

## 2019-05-11 DIAGNOSIS — G40909 Epilepsy, unspecified, not intractable, without status epilepticus: Secondary | ICD-10-CM

## 2019-05-21 ENCOUNTER — Ambulatory Visit: Payer: Medicare Other | Attending: Internal Medicine

## 2019-05-21 DIAGNOSIS — Z23 Encounter for immunization: Secondary | ICD-10-CM

## 2019-05-21 NOTE — Progress Notes (Signed)
   Covid-19 Vaccination Clinic  Name:  MANDA SHOUSE    MRN: [id number] DOB: 18-Nov-1940  05/21/2019  Ms. Bellina was observed post Covid-19 immunization for 15 minutes without incident. She was provided with Vaccine Information Sheet and instruction to access the V-Safe system.   Ms. Danese was instructed to call 911 with any severe reactions post vaccine: Marland Kitchen Difficulty breathing  . Swelling of face and throat  . A fast heartbeat  . A bad rash all over body  . Dizziness and weakness   Immunizations Administered    Name Date Dose VIS Date Route   Pfizer COVID-19 Vaccine 05/21/2019  9:12 AM 0.3 mL 01/16/2019 Intramuscular   Manufacturer: [employer], [employer]   Lot: [id number]   [place]: [id number]

## 2019-06-16 ENCOUNTER — Ambulatory Visit: Payer: Medicare Other

## 2019-06-16 ENCOUNTER — Ambulatory Visit: Payer: Medicare Other | Attending: Internal Medicine

## 2019-06-16 DIAGNOSIS — Z23 Encounter for immunization: Secondary | ICD-10-CM

## 2019-06-16 NOTE — Progress Notes (Signed)
   Covid-19 Vaccination Clinic  Name:  SAMMYJO MITTAG    MRN: [id number] DOB: 1941/01/06  06/16/2019  Ms. Tackett was observed post Covid-19 immunization for 15 minutes without incident. She was provided with Vaccine Information Sheet and instruction to access the V-Safe system.   Ms. Caramanica was instructed to call 911 with any severe reactions post vaccine: Marland Kitchen Difficulty breathing  . Swelling of face and throat  . A fast heartbeat  . A bad rash all over body  . Dizziness and weakness   Immunizations Administered    Name Date Dose VIS Date Route   Pfizer COVID-19 Vaccine 06/16/2019  9:22 AM 0.3 mL 04/01/2018 Intramuscular   Manufacturer: [place]   Lot: [id number]   [place]: [id number]

## 2019-10-05 ENCOUNTER — Other Ambulatory Visit: Payer: Self-pay | Admitting: Internal Medicine

## 2019-10-05 DIAGNOSIS — Z1231 Encounter for screening mammogram for malignant neoplasm of breast: Secondary | ICD-10-CM

## 2019-10-29 ENCOUNTER — Other Ambulatory Visit: Payer: Self-pay

## 2019-10-29 ENCOUNTER — Ambulatory Visit
Admission: RE | Admit: 2019-10-29 | Discharge: 2019-10-29 | Disposition: A | Discharge status: Home / Self Care | Payer: Medicare Other | Source: Ambulatory Visit | Attending: Internal Medicine | Admitting: Internal Medicine

## 2019-10-29 DIAGNOSIS — Z1231 Encounter for screening mammogram for malignant neoplasm of breast: Secondary | ICD-10-CM | POA: Insufficient documentation

## 2019-12-16 ENCOUNTER — Ambulatory Visit (INDEPENDENT_AMBULATORY_CARE_PROVIDER_SITE_OTHER): Payer: Medicare Other

## 2019-12-16 ENCOUNTER — Other Ambulatory Visit: Payer: Self-pay

## 2019-12-16 ENCOUNTER — Encounter: Payer: Self-pay | Admitting: Podiatry

## 2019-12-16 ENCOUNTER — Ambulatory Visit (INDEPENDENT_AMBULATORY_CARE_PROVIDER_SITE_OTHER): Payer: Medicare Other | Admitting: Podiatry

## 2019-12-16 DIAGNOSIS — M79674 Pain in right toe(s): Secondary | ICD-10-CM

## 2019-12-16 DIAGNOSIS — B351 Tinea unguium: Secondary | ICD-10-CM

## 2019-12-16 DIAGNOSIS — M7750 Other enthesopathy of unspecified foot: Secondary | ICD-10-CM

## 2019-12-16 DIAGNOSIS — M19072 Primary osteoarthritis, left ankle and foot: Secondary | ICD-10-CM

## 2019-12-16 DIAGNOSIS — M79675 Pain in left toe(s): Secondary | ICD-10-CM | POA: Diagnosis not present

## 2019-12-16 DIAGNOSIS — M7752 Other enthesopathy of left foot: Secondary | ICD-10-CM

## 2019-12-16 DIAGNOSIS — M2042 Other hammer toe(s) (acquired), left foot: Secondary | ICD-10-CM

## 2019-12-19 NOTE — Progress Notes (Signed)
  Subjective:  Patient ID: Anna Chambers, female    DOB: 12-25-1940,  MRN: [id number]  Chief Complaint  Patient presents with  . Foot Pain    Patient presents today for left ankle/foot pain x 1 year off and on.  She also c/o hammertoes left 2nd and 3rd and callous lesion bottom of left forefoot.  She states "when I walk, my toes want to curl under and I feel like Im gonna fall"  . Nail Problem    She also request a nail trim today    79 y.o. female presents with the above complaint. History confirmed with patient.   Objective:  Physical Exam: warm, good capillary refill, no trophic changes or ulcerative lesions, normal DP and PT pulses and normal sensory exam. Onychomycosis x10. On the left foot she has semireducible hammertoe contracture of the second and third toes. She has tenderness to palpation over the talonavicular and naviculocuneiform joints with dorsal spurring palpable   Radiographs: X-ray of the left foot: Pes planus noted with degenerative changes throughout the midtarsal joint noted with dorsal spurring of the talonavicular joint and naviculocuneiform joints Assessment:   1. 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 Onychomycosis   3. Pain due to onychomycosis of toenails of both feet   4. Hammertoe of left foot      Plan:  Patient was evaluated and treated and all questions answered.  Discussed the etiology and treatment options for the condition in detail with the patient. Educated patient on the topical and oral treatment options for mycotic nails. Recommended debridement of the nails today. Sharp and mechanical debridement performed of all painful and mycotic nails today. Nails debrided in length and thickness using a nail nipper and a mechanical burr to level of comfort. Discussed treatment options including appropriate shoe gear. Follow up as needed for painful nails.   Discussed with her the etiology and treatment options for midfoot arthritis in detail  including surgical and nonsurgical treatment. Discussed with her that the only definitive treatment would be surgical treatment. I recommended she try stiffen shoe gears and use Voltaren gel as needed for pain relief. She prefer to avoid surgery and I think this would be a good idea for her. Silicone toe pads dispensed for the hammertoe pain  No follow-ups on file.

## 2020-01-25 ENCOUNTER — Encounter: Payer: Self-pay | Admitting: Neurology

## 2020-01-25 ENCOUNTER — Ambulatory Visit (INDEPENDENT_AMBULATORY_CARE_PROVIDER_SITE_OTHER): Payer: Medicare Other | Admitting: Neurology

## 2020-01-25 ENCOUNTER — Other Ambulatory Visit: Payer: Self-pay

## 2020-01-25 VITALS — BP 132/80 | HR 65 | Ht 64.0 in | Wt 174.0 lb

## 2020-01-25 DIAGNOSIS — M5417 Radiculopathy, lumbosacral region: Secondary | ICD-10-CM | POA: Diagnosis not present

## 2020-01-25 DIAGNOSIS — M21372 Foot drop, left foot: Secondary | ICD-10-CM | POA: Diagnosis not present

## 2020-01-25 DIAGNOSIS — Z981 Arthrodesis status: Secondary | ICD-10-CM | POA: Diagnosis not present

## 2020-01-25 DIAGNOSIS — G40909 Epilepsy, unspecified, not intractable, without status epilepticus: Secondary | ICD-10-CM

## 2020-01-25 NOTE — Patient Instructions (Signed)
Return to clinic in 1 year.
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
## 2020-01-26 ENCOUNTER — Telehealth: Payer: Self-pay

## 2020-01-26 DIAGNOSIS — M5417 Radiculopathy, lumbosacral region: Secondary | ICD-10-CM

## 2020-01-26 DIAGNOSIS — M5106 Intervertebral disc disorders with myelopathy, lumbar region: Secondary | ICD-10-CM

## 2020-01-26 NOTE — Telephone Encounter (Signed)
-----   Message from Alda Berthold, DO sent at 01/25/2020 12:43 PM EST ----- Please refer pt for out-pt PT for left lumbar radiculopathy. She prefers something near [place]. Thanks.

## 2020-02-19 ENCOUNTER — Ambulatory Visit
Admission: EM | Admit: 2020-02-19 | Discharge: 2020-02-19 | Disposition: A | Discharge status: Home / Self Care | Payer: Medicare Other | Attending: Emergency Medicine | Admitting: Emergency Medicine

## 2020-02-19 ENCOUNTER — Encounter: Payer: Self-pay | Admitting: Emergency Medicine

## 2020-02-19 ENCOUNTER — Other Ambulatory Visit: Payer: Self-pay

## 2020-02-19 ENCOUNTER — Ambulatory Visit (INDEPENDENT_AMBULATORY_CARE_PROVIDER_SITE_OTHER): Payer: Medicare Other

## 2020-02-19 DIAGNOSIS — R519 Headache, unspecified: Secondary | ICD-10-CM | POA: Diagnosis not present

## 2020-02-19 DIAGNOSIS — R52 Pain, unspecified: Secondary | ICD-10-CM | POA: Diagnosis not present

## 2020-02-19 DIAGNOSIS — J029 Acute pharyngitis, unspecified: Secondary | ICD-10-CM | POA: Diagnosis not present

## 2020-02-19 DIAGNOSIS — R059 Cough, unspecified: Secondary | ICD-10-CM | POA: Diagnosis not present

## 2020-02-19 DIAGNOSIS — J069 Acute upper respiratory infection, unspecified: Secondary | ICD-10-CM | POA: Diagnosis not present

## 2020-02-19 MED ORDER — BENZONATATE 100 MG PO CAPS: 200.0000 mg | ORAL_CAPSULE | Freq: Three times a day (TID) | ORAL | 0 refills | Status: DC

## 2020-02-19 MED ORDER — PROMETHAZINE-DM 6.25-15 MG/5ML PO SYRP: 5.0000 mL | ORAL_SOLUTION | Freq: Four times a day (QID) | ORAL | 0 refills | Status: DC | PRN

## 2020-02-19 NOTE — ED Triage Notes (Signed)
Pt states that she has a sore throat, cough, HA, and body aches. Pt states that her sx started last night and was tested for Covid yesterday.

## 2020-02-19 NOTE — ED Provider Notes (Signed)
[facility]    CSN: [id number] Arrival date & time: 02/19/20  1106      History   Chief Complaint Chief Complaint  Patient presents with  . Sore Throat  . Cough  . Headache    HPI Anna Chambers is a 80 y.o. female.   HPI   80 year old female here for evaluation of sore throat, headache, body aches, and productive cough that started yesterday evening.  Patient reports that her husband has similar symptoms.  She is also complaining of fatigue and decreased appetite.  Patient denies fever, runny nose or nasal congestion, shortness of breath or wheezing, or GI complaints.  Patient has had her COVID-vaccine and booster, as well as her flu vaccine.  Was tested for COVID yesterday but does not have her results yet.  Past Medical History:  Diagnosis Date  . 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HCC)   . Depression   . Hypertension   . Seizure ([place])   . Sleep apnea   . Syncope and collapse 02/25/2017    Patient Active Problem List   Diagnosis Date Noted  . Depression 04/17/2019  . Displacement of lumbar intervertebral disc without myelopathy 04/17/2019  . Glaucoma (increased eye pressure) 04/17/2019  . Hyperlipemia 04/17/2019  . Lumbar disc disorder with myelopathy 04/17/2019  . 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 Chronic kidney disease 01/22/2019  . Hypertension 01/22/2019  . Sleep apnea 01/22/2019  . History of poliomyelitis 09/24/2018  . Hx of adenomatous colonic polyps 09/24/2018  . Vitamin B12 deficiency (non anemic) 09/02/2018  . Syncope 02/25/2017  . 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HCC) 01/26/2016  . Lumbosacral radiculopathy 01/26/2016  . S/P cervical spinal fusion 01/26/2016  . 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  . Obesity (BMI 30-39.9) 12/07/2014  . Thoracic or lumbosacral neuritis or radiculitis 12/03/2011  . Cervical spondylosis with myelopathy 12/03/2011  . Shortness of breath 05/08/2011  . DDD (degenerative  disc disease), cervical 01/17/2011  . Low back pain 01/09/2011  . Pain in joints 10/23/2010    Past Surgical History:  Procedure Laterality Date  . CATARACT EXTRACTION    . COLONOSCOPY    . COLONOSCOPY WITH PROPOFOL N/A 12/04/2018   Procedure: COLONOSCOPY WITH PROPOFOL;  Surgeon: Toledo, Benay Pike, MD;  Location: [facility] ENDOSCOPY;  Service: Gastroenterology;  Laterality: N/A;  . EYE SURGERY    . FOOT SURGERY Left   . NECK SURGERY    . SPINE SURGERY     ACDF C3-4  . VAGINAL HYSTERECTOMY      OB History   No obstetric history on file.      Home Medications    Prior to Admission medications   Medication Sig Start Date End Date Taking? Authorizing Provider  benzonatate (TESSALON) 100 MG capsule Take 2 capsules (200 mg total) by mouth every 8 (eight) hours. 02/19/20  Yes Margarette Canada, NP  promethazine-dextromethorphan (PROMETHAZINE-DM) 6.25-15 MG/5ML syrup Take 5 mLs by mouth 4 (four) times daily as needed. 02/19/20  Yes Margarette Canada, NP  allopurinol (ZYLOPRIM) 100 MG tablet  09/02/18   [provider]  aspirin 81 MG tablet Take 81 mg by mouth daily.    [provider]  b complex vitamins tablet Take 1 tablet by mouth daily.    [provider]  bisoprolol-hydrochlorothiazide (ZIAC) 2.5-6.25 MG per tablet Take 1 tablet by mouth daily.    [provider]  brimonidine-timolol (COMBIGAN) 0.2-0.5 % ophthalmic solution Administer 10 drops to both eyes Two (2) times a day. Frequency:PHARMDIR  Dosage:57ml     Instructions:  Note:Instill one drop in each eye twice daily. Dose: 1 05/03/09   [provider]  brinzolamide (AZOPT) 1 % ophthalmic suspension Administer 1 drop to both eyes Two (2) times a day (at 8am and 5pm). Frequency:PHARMDIR   Dosage:0.0     Instructions:  Note:Instill one drop in each eye twice a day. Dose: 1 05/03/09   [provider]  CHOLECALCIFEROL PO Take 1,000 Units by mouth. Patient not taking: Reported on 01/25/2020     [provider]  DUREZOL 0.05 % EMUL Place 1 drop into the right eye 4 (four) times daily. 10/19/19   [provider]  fluticasone (FLONASE) 50 MCG/ACT nasal spray fluticasone propionate 50 mcg/actuation nasal spray,suspension  Fluticasone Propionate 50 MCG/ACT Nasal Suspension QTY: 1 gram Days: 30 Refills: 5  Written: 04/03/19 Patient Instructions: use as directed  2 sprays each nostril qd 04/03/19   [provider]  gabapentin (NEURONTIN) 100 MG capsule Take 300 mg by mouth 2 (two) times daily. 09/30/19   [provider]  ketorolac (ACULAR) 0.5 % ophthalmic solution INT 1 GTT IN OD QID Patient not taking: Reported on 01/25/2020 09/25/18   [provider]  latanoprost (XALATAN) 0.005 % ophthalmic solution Administer 2.5 mL to both eyes nightly. Frequency:QD   Dosage:0.0     Instructions:  Note:Dose: 0.005 % 11/06/11   [provider]  levETIRAcetam (KEPPRA) 500 MG tablet TAKE 1 TABLET BY MOUTH TWICE A DAY 05/11/19   Patel, Donika K, DO  Omega-3 Fatty Acids (OMEGA-3 FISH OIL) 300 MG CAPS Take by mouth.    [provider]  oxybutynin (DITROPAN-XL) 5 MG 24 hr tablet TK 1 T PO  QD 04/01/15   [provider]  prednisoLONE acetate (PRED FORTE) 1 % ophthalmic suspension Place 1 drop into the right eye 4 (four) times daily. Patient not taking: Reported on 01/25/2020    [provider]    Family History Family History  Problem Relation Age of Onset  . Hypertension Mother   . Kidney failure Mother   . Heart disease Mother   . Hypertension Father   . Cancer Father   . Hypertension Sister   . Hypertension Sister   . Healthy Son   . Breast cancer Neg Hx     Social History Social History   Tobacco Use  . Smoking status: Never Smoker  . Smokeless tobacco: Never Used  Vaping Use  . Vaping Use: Never used  Substance Use Topics  . Alcohol use: No    Alcohol/week: 0.0 standard drinks  . Drug use: No     Allergies    Simvastatin   Review of Systems Review of Systems  Constitutional: Positive for appetite change and fatigue. Negative for fever.  HENT: Positive for sore throat. Negative for congestion and rhinorrhea.   Respiratory: Positive for cough. Negative for shortness of breath and wheezing.   Gastrointestinal: Negative for diarrhea and vomiting.  Musculoskeletal: Positive for arthralgias and myalgias.  Skin: Negative for rash.  Neurological: Positive for headaches.  Hematological: Negative.   Psychiatric/Behavioral: Negative.      Physical Exam Triage Vital Signs ED Triage Vitals  Enc Vitals Group     BP 02/19/20 1225 111/69     Pulse Rate 02/19/20 1225 80     Resp 02/19/20 1225 17     Temp 02/19/20 1225 98.3 F (36.8 C)     Temp Source 02/19/20 1225 Oral     SpO2 02/19/20  1225 97 %     Weight --      Height --      Head Circumference --      Peak Flow --      Pain Score 02/19/20 1224 5     Pain Loc --      Pain Edu? --      Excl. in [place]? --    No data found.  Updated Vital Signs BP 111/69 (BP Location: Left Arm)   Pulse 80   Temp 98.3 F (36.8 C) (Oral)   Resp 17   LMP  (LMP Unknown)   SpO2 97%   Visual Acuity Right Eye Distance:   Left Eye Distance:   Bilateral Distance:    Right Eye Near:   Left Eye Near:    Bilateral Near:     Physical Exam Vitals and nursing note reviewed.  Constitutional:      General: She is not in acute distress.    Appearance: She is well-developed. She is not toxic-appearing.  HENT:     Head: Normocephalic and atraumatic.     Right Ear: Tympanic membrane, ear canal and external ear normal. Tympanic membrane is not erythematous.     Left Ear: Tympanic membrane, ear canal and external ear normal. Tympanic membrane is not erythematous.     Nose: Nose normal. No congestion or rhinorrhea.     Mouth/Throat:     Mouth: Mucous membranes are moist.     Pharynx: Oropharynx is clear. Posterior oropharyngeal erythema present.     Comments:  Patient has mild posterior oropharyngeal erythema with clear postnasal drip. Cardiovascular:     Rate and Rhythm: Normal rate and regular rhythm.     Pulses: Normal pulses.     Heart sounds: Normal heart sounds. No murmur heard. No gallop.   Pulmonary:     Effort: Pulmonary effort is normal.     Breath sounds: Normal breath sounds. No rhonchi or rales.  Musculoskeletal:     Cervical back: Normal range of motion and neck supple.  Lymphadenopathy:     Cervical: No cervical adenopathy.  Skin:    General: Skin is warm and dry.     Capillary Refill: Capillary refill takes less than 2 seconds.     Findings: No erythema or rash.  Neurological:     General: No focal deficit present.     Mental Status: She is alert and oriented to person, place, and time.  Psychiatric:        Mood and Affect: Mood normal.        Behavior: Behavior normal.        Thought Content: Thought content normal.        Judgment: Judgment normal.      UC Treatments / Results  Labs (all labs ordered are listed, but only abnormal results are displayed) Labs Reviewed - No data to display  EKG   Radiology DG Chest 2 View  Result Date: 02/19/2020 CLINICAL DATA:  Cough, headache, body aches and sore throat. EXAM: CHEST - 2 VIEW COMPARISON:  Single-view of the chest 02/25/2017. PA and lateral chest 07/11/2014. FINDINGS: The lungs are clear. Heart size is normal. Aortic atherosclerosis. No pneumothorax or pleural fluid. No acute or focal bony abnormality. IMPRESSION: No acute disease. Aortic Atherosclerosis (ICD10-I70.0). Electronically Signed   By: Inge Rise M.D.   On: 02/19/2020 13:23    Procedures Procedures (including critical care time)  Medications Ordered in UC Medications - No data to display  Initial Impression / Assessment and Plan / UC Course  I have reviewed the triage vital signs and the nursing notes.  Pertinent labs & imaging results that were available during my care of the patient were  reviewed by me and considered in my medical decision making (see chart for details).   Evaluation of cough, sore throat, body aches, and headache that started last night.  Patient has been fully vaccinated against COVID including her booster and she is unaware of any COVID exposure.  Patient reports that her husband has the same symptoms and he is also fully vaccinated.  Patient is nontoxic-appearing physical exam reveals mild posterior oropharyngeal erythema with some clear postnasal drip.  Patient does have fine crackles on the left lower lobe posteriorly.  Remainder the lung fields are clear.  Despite the patient not having any fever will obtain chest x-ray due to the productive cough for yellow sputum to rule out pneumonia.  Radiology interpretation of chest x-ray is negative for acute intrathoracic process.  Will discharge patient home to isolate pending the results of her COVID test.  We will have her use Tylenol and ibuprofen as needed for sore throat and body aches, Tessalon Perles and Promethazine DM for cough and congestion, and give will give ER precautions.  Final Clinical Impressions(s) / UC Diagnoses   Final diagnoses:  Viral URI with cough     Discharge Instructions     Your chest x-ray did not show any signs of pneumonia.  Isolate at home until the results of your COVID test are back.  If your test is positive then you will need to quarantine for 5 days from when your symptoms started.  After 5 days you can break quarantine if your symptoms have improved and you have not had a fever for 24 hours.  Use Tylenol and ibuprofen as needed for fever and body aches.  Use the Tessalon Perles during the day for cough and the Promethazine DM cough syrup at bedtime as it will make you drowsy.  If you develop shortness of breath-especially at rest, you cannot speak in full sentences, or is a late sign your lips are turning blue you need to go to the ER for evaluation.    ED  Prescriptions    Medication Sig Dispense Auth. Provider   benzonatate (TESSALON) 100 MG capsule Take 2 capsules (200 mg total) by mouth every 8 (eight) hours. 21 capsule Margarette Canada, NP   promethazine-dextromethorphan (PROMETHAZINE-DM) 6.25-15 MG/5ML syrup Take 5 mLs by mouth 4 (four) times daily as needed. 118 mL Margarette Canada, NP     PDMP not reviewed this encounter.   Margarette Canada, NP 02/19/20 1345

## 2020-02-19 NOTE — Discharge Instructions (Addendum)
Your chest x-ray did not show any signs of pneumonia.  Isolate at home until the results of your COVID test are back.  If your test is positive then you will need to quarantine for 5 days from when your symptoms started.  After 5 days you can break quarantine if your symptoms have improved and you have not had a fever for 24 hours.  Use Tylenol and ibuprofen as needed for fever and body aches.  Use the Tessalon Perles during the day for cough and the Promethazine DM cough syrup at bedtime as it will make you drowsy.  If you develop shortness of breath-especially at rest, you cannot speak in full sentences, or is a late sign your lips are turning blue you need to go to the ER for evaluation.

## 2020-03-15 ENCOUNTER — Other Ambulatory Visit: Payer: Self-pay | Admitting: Neurology

## 2020-03-15 DIAGNOSIS — G40909 Epilepsy, unspecified, not intractable, without status epilepticus: Secondary | ICD-10-CM

## 2020-05-16 ENCOUNTER — Other Ambulatory Visit: Payer: Self-pay

## 2020-05-16 ENCOUNTER — Ambulatory Visit (INDEPENDENT_AMBULATORY_CARE_PROVIDER_SITE_OTHER): Payer: Medicare Other

## 2020-05-16 ENCOUNTER — Ambulatory Visit
Admission: EM | Admit: 2020-05-16 | Discharge: 2020-05-16 | Disposition: A | Discharge status: Home / Self Care | Payer: Medicare Other | Attending: Internal Medicine | Admitting: Internal Medicine

## 2020-05-16 ENCOUNTER — Encounter: Payer: Self-pay | Admitting: Emergency Medicine

## 2020-05-16 DIAGNOSIS — M25462 Effusion, left knee: Secondary | ICD-10-CM | POA: Diagnosis not present

## 2020-05-16 DIAGNOSIS — M1712 Unilateral primary osteoarthritis, left knee: Secondary | ICD-10-CM | POA: Diagnosis not present

## 2020-05-16 MED ORDER — DICLOFENAC SODIUM 1 % EX GEL: 2.0000 g | Freq: Four times a day (QID) | CUTANEOUS | 0 refills | Status: DC

## 2020-05-16 NOTE — ED Triage Notes (Signed)
Pt is present with left knee swelling. Pt states this has been an ongoing issue but since Saturday pt noticed that the swelling was worst. Pt denies any fever, falls, or injury.

## 2020-05-16 NOTE — Discharge Instructions (Signed)
Your xray shows moderate arthritis of ou left knee Follow up with [facility] in the next few days

## 2020-05-16 NOTE — ED Provider Notes (Signed)
[facility]    CSN: [id number] Arrival date & time: 05/16/20  1406      History   Chief Complaint Chief Complaint  Patient presents with  . Joint Swelling    HPI Anna Chambers is a 80 y.o. female who presents with L knee swelling x 2 days. Has been aching and thinks she has OA. The pain radiates from mid knee to lateral mid calf     Past Medical History:  Diagnosis Date  . 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HCC)   . Depression   . Hypertension   . Seizure ([place])   . Sleep apnea   . Syncope and collapse 02/25/2017    Patient Active Problem List   Diagnosis Date Noted  . Depression 04/17/2019  . Displacement of lumbar intervertebral disc without myelopathy 04/17/2019  . Glaucoma (increased eye pressure) 04/17/2019  . Hyperlipemia 04/17/2019  . Lumbar disc disorder with myelopathy 04/17/2019  . 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 Chronic kidney disease 01/22/2019  . Hypertension 01/22/2019  . Sleep apnea 01/22/2019  . History of poliomyelitis 09/24/2018  . Hx of adenomatous colonic polyps 09/24/2018  . Vitamin B12 deficiency (non anemic) 09/02/2018  . Syncope 02/25/2017  . 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HCC) 01/26/2016  . Lumbosacral radiculopathy 01/26/2016  . S/P cervical spinal fusion 01/26/2016  . 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  . Obesity (BMI 30-39.9) 12/07/2014  . Thoracic or lumbosacral neuritis or radiculitis 12/03/2011  . Cervical spondylosis with myelopathy 12/03/2011  . Shortness of breath 05/08/2011  . DDD (degenerative disc disease), cervical 01/17/2011  . Low back pain 01/09/2011  . Pain in joints 10/23/2010    Past Surgical History:  Procedure Laterality Date  . CATARACT EXTRACTION    . COLONOSCOPY    . COLONOSCOPY WITH PROPOFOL N/A 12/04/2018   Procedure: COLONOSCOPY WITH PROPOFOL;  Surgeon: Toledo, Benay Pike, MD;  Location: [facility] END[facility]COPY;  Service: Gastroenterology;  Laterality: N/A;  .  EYE SURGERY    . FOOT SURGERY Left   . NECK SURGERY    . SPINE SURGERY     ACDF C3-4  . VAGINAL HYSTERECTOMY      OB History   No obstetric history on file.      Home Medications    Prior to Admission medications   Medication Sig Start Date End Date Taking? Authorizing Provider  diclofenac Sodium (VOLTAREN) 1 % GEL Apply 2 g topically 4 (four) times daily. 05/16/20  Yes Rodriguez-Southworth, Sunday Spillers, PA-C  allopurinol (ZYLOPRIM) 100 MG tablet  09/02/18   [provider]  aspirin 81 MG tablet Take 81 mg by mouth daily.    [provider]  b complex vitamins tablet Take 1 tablet by mouth daily.    [provider]  bisoprolol-hydrochlorothiazide (ZIAC) 2.5-6.25 MG per tablet Take 1 tablet by mouth daily.    [provider]  brimonidine-timolol (COMBIGAN) 0.2-0.5 % ophthalmic solution Administer 10 drops to both eyes Two (2) times a day. Frequency:PHARMDIR   Dosage:48ml     Instructions:  Note:Instill one drop in each eye twice daily. Dose: 1 05/03/09   [provider]  brinzolamide (AZOPT) 1 % ophthalmic suspension Administer 1 drop to both eyes Two (2) times a day (at 8am and 5pm). Frequency:PHARMDIR   Dosage:0.0     Instructions:  Note:Instill one drop in each eye twice a day. Dose: 1 05/03/09   [provider]  CHOLECALCIFEROL PO Take 1,000 Units by mouth. Patient not taking: Reported on 01/25/2020  [provider]  DUREZOL 0.05 % EMUL Place 1 drop into the right eye 4 (four) times daily. 10/19/19   [provider]  fluticasone (FLONASE) 50 MCG/ACT nasal spray fluticasone propionate 50 mcg/actuation nasal spray,suspension  Fluticasone Propionate 50 MCG/ACT Nasal Suspension QTY: 1 gram Days: 30 Refills: 5  Written: 04/03/19 Patient Instructions: use as directed  2 sprays each nostril qd 04/03/19   [provider]  ketorolac (ACULAR) 0.5 % ophthalmic solution INT 1 GTT IN OD QID Patient not taking: Reported on  01/25/2020 09/25/18   [provider]  latanoprost (XALATAN) 0.005 % ophthalmic solution Administer 2.5 mL to both eyes nightly. Frequency:QD   Dosage:0.0     Instructions:  Note:Dose: 0.005 % 11/06/11   [provider]  levETIRAcetam (KEPPRA) 500 MG tablet TAKE 1 TABLET BY MOUTH TWICE A DAY 03/15/20   Patel, Donika K, DO  Omega-3 Fatty Acids (OMEGA-3 FISH OIL) 300 MG CAPS Take by mouth.    [provider]  oxybutynin (DITROPAN-XL) 5 MG 24 hr tablet TK 1 T PO  QD 04/01/15   [provider]  gabapentin (NEURONTIN) 100 MG capsule Take 300 mg by mouth 2 (two) times daily. 09/30/19 05/16/20  [provider]    Family History Family History  Problem Relation Age of Onset  . Hypertension Mother   . Kidney failure Mother   . Heart disease Mother   . Hypertension Father   . Cancer Father   . Hypertension Sister   . Hypertension Sister   . Healthy Son   . Breast cancer Neg Hx     Social History Social History   Tobacco Use  . Smoking status: Never Smoker  . Smokeless tobacco: Never Used  Vaping Use  . Vaping Use: Never used  Substance Use Topics  . Alcohol use: No    Alcohol/week: 0.0 standard drinks  . Drug use: No     Allergies   Simvastatin   Review of Systems Review of Systems  Musculoskeletal: Positive for arthralgias, gait problem and joint swelling.  Skin: Negative for color change, rash and wound.  Neurological: Negative for numbness.     Physical Exam Triage Vital Signs ED Triage Vitals  Enc Vitals Group     BP 05/16/20 1438 139/65     Pulse Rate 05/16/20 1438 64     Resp 05/16/20 1438 18     Temp 05/16/20 1438 98.3 F (36.8 C)     Temp Source 05/16/20 1438 Oral     SpO2 05/16/20 1438 96 %     Weight --      Height --      Head Circumference --      Peak Flow --      Pain Score 05/16/20 1437 0     Pain Loc --      Pain Edu? --      Excl. in [place]? --    No data found.  Updated Vital Signs BP 139/65 (BP  Location: Left Arm)   Pulse 64   Temp 98.3 F (36.8 C) (Oral)   Resp 18   LMP  (LMP Unknown)   SpO2 96%   Visual Acuity Right Eye Distance:   Left Eye Distance:   Bilateral Distance:    Right Eye Near:   Left Eye Near:    Bilateral Near:     Physical Exam Vitals and nursing note reviewed.  Constitutional:      Appearance: She is obese.  Comments: Uses a cane  HENT:     Right Ear: External ear normal.     Left Ear: External ear normal.  Eyes:     General: No scleral icterus.    Conjunctiva/sclera: Conjunctivae normal.  Pulmonary:     Effort: Pulmonary effort is normal.  Musculoskeletal:     Cervical back: Neck supple.     Comments: L KNEE- with mild swelling over suprapatellar region. Has joint line tenderness more so on the lateral area. ROM is normal   Skin:    General: Skin is warm and dry.  Neurological:     Mental Status: She is alert and oriented to person, place, and time.     Gait: Gait normal.  Psychiatric:        Mood and Affect: Mood normal.        Behavior: Behavior normal.        Thought Content: Thought content normal.        Judgment: Judgment normal.      UC Treatments / Results  Labs (all labs ordered are listed, but only abnormal results are displayed) Labs Reviewed - No data to display  EKG   Radiology DG Knee AP/LAT W/Sunrise Left  Result Date: 05/16/2020 CLINICAL DATA:  Left knee swelling.  No known injury. EXAM: LEFT KNEE 3 VIEWS COMPARISON:  None. FINDINGS: Upright AP with lateral and patellar sunrise views of the left knee obtained. Moderate lateral tibiofemoral joint space narrowing. Patella normally situated in the trochlear groove. Moderate tricompartmental peripheral spurs. There is a patellar tendon enthesophyte. Trace joint effusion. Mild soft tissue edema anteriorly. No erosion or bony destruction. Vascular calcifications are seen. IMPRESSION: Moderate tricompartmental osteoarthritis, most prominent in the lateral tibiofemoral  compartment. Trace joint effusion. Electronically Signed   By: Keith Rake M.D.   On: 05/16/2020 15:36    Procedures Procedures (including critical care time)  Medications Ordered in UC Medications - No data to display  Initial Impression / Assessment and Plan / UC Course  I have reviewed the triage vital signs and the nursing notes. Pertinent imaging results that were available during my care of the patient were reviewed by me and considered in my medical decision making (see chart for details). L knee OA. I placed her on Voltaren get topically as noted. Needs to Fu with ortho.   Final Clinical Impressions(s) / UC Diagnoses   Final diagnoses:  Primary osteoarthritis of left knee     Discharge Instructions     Your xray shows moderate arthritis of ou left knee Follow up with [facility] in the next few days    ED Prescriptions    Medication Sig Dispense Auth. Provider   diclofenac Sodium (VOLTAREN) 1 % GEL Apply 2 g topically 4 (four) times daily. 350 g Rodriguez-Southworth, Sunday Spillers, PA-C     PDMP not reviewed this encounter.   Shelby Mattocks, [place] 05/16/20 1549

## 2020-05-23 ENCOUNTER — Telehealth: Payer: Self-pay | Admitting: Cardiovascular Disease

## 2020-05-23 NOTE — Telephone Encounter (Signed)
Patient states she does not want to go to [facility] and requests to switch from Dr. Oval Linsey to Dr. Margaretann Loveless.

## 2020-05-23 NOTE — Telephone Encounter (Signed)
Ok with me 

## 2020-09-14 ENCOUNTER — Encounter: Payer: Self-pay | Admitting: Emergency Medicine

## 2020-09-14 ENCOUNTER — Other Ambulatory Visit: Payer: Self-pay

## 2020-09-14 ENCOUNTER — Ambulatory Visit
Admission: EM | Admit: 2020-09-14 | Discharge: 2020-09-14 | Disposition: A | Discharge status: Home / Self Care | Payer: Medicare Other | Attending: Family Medicine | Admitting: Family Medicine

## 2020-09-14 DIAGNOSIS — M545 Low back pain, unspecified: Secondary | ICD-10-CM | POA: Diagnosis not present

## 2020-09-14 MED ORDER — PREDNISONE 10 MG PO TABS: ORAL_TABLET | ORAL | 0 refills | Status: DC

## 2020-09-14 NOTE — ED Provider Notes (Signed)
[facility]    CSN: [id number] Arrival date & time: 09/14/20  1000      History   Chief Complaint Chief Complaint  Patient presents with   Hip Pain   HPI  80 year old female presents with the above complaint.  Patient reports that her pain started yesterday.  She reports that it is "hip pain".  Localizes the pain to the right side of the low back.  She rates her pain as 10/10 in severity.  She has longstanding history of lumbar disc disease.  No relieving factors.  No fall, trauma, injury.  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   Chronic kidney disease 01/22/2019   Hypertension 01/22/2019   Sleep apnea 01/22/2019   History of poliomyelitis 09/24/2018   Hx of adenomatous colonic polyps 09/24/2018   Vitamin B12 deficiency (non anemic) 09/02/2018   Syncope 02/25/2017   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[place]) 01/26/2016   Lumbosacral radiculopathy 01/26/2016   S/P cervical spinal fusion 01/26/2016   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   Obesity (BMI 30-39.9) 12/07/2014   Thoracic or lumbosacral neuritis or radiculitis 12/03/2011   Cervical spondylosis with myelopathy 12/03/2011   Shortness of breath 05/08/2011   DDD (degenerative disc disease), cervical 01/17/2011   Low back pain 01/09/2011   Pain in joints 10/23/2010    Past Surgical History:  Procedure Laterality Date   CATARACT EXTRACTION     COLONOSCOPY     COLONOSCOPY WITH PROPOFOL N/A 12/04/2018   Procedure:  COLONOSCOPY WITH PROPOFOL;  Surgeon: Toledo, Benay Pike, MD;  Location: [facility] ENDOSCOPY;  Service: Gastroenterology;  Laterality: N/A;   EYE SURGERY     FOOT SURGERY Left    NECK SURGERY     SPINE SURGERY     ACDF C3-4   VAGINAL HYSTERECTOMY      OB History   No obstetric history on file.      Home Medications    Prior to Admission medications   Medication Sig Start Date End Date Taking? Authorizing Provider  allopurinol (ZYLOPRIM) 100 MG tablet  09/02/18  Yes [provider]  b complex vitamins tablet Take 1 tablet by mouth daily.   Yes [provider]  bisoprolol-hydrochlorothiazide (ZIAC) 2.5-6.25 MG per tablet Take 1 tablet by mouth daily.   Yes [provider]  brimonidine-timolol (COMBIGAN) 0.2-0.5 % ophthalmic solution Administer 10 drops to both eyes Two (2) times a day. Frequency:PHARMDIR   Dosage:23m     Instructions:  Note:Instill one drop in each eye twice daily. Dose: 1 05/03/09  Yes [provider]  brinzolamide (AZOPT) 1 % ophthalmic suspension Administer 1 drop to both eyes Two (2) times a day (at 8am and 5pm). Frequency:PHARMDIR   Dosage:0.0     Instructions:  Note:Instill one drop in each eye twice a day. Dose: 1 05/03/09  Yes [provider]  CHOLECALCIFEROL PO Take 1,000 Units by mouth.   Yes [provider]  DUREZOL 0.05 % EMUL Place 1 drop into the right eye 4 (four) times daily.  10/19/19  Yes [provider]  ketorolac (ACULAR) 0.5 % ophthalmic solution  09/25/18  Yes [provider]  latanoprost (XALATAN) 0.005 % ophthalmic solution Administer 2.5 mL to both eyes nightly. Frequency:QD   Dosage:0.0     Instructions:  Note:Dose: 0.005 % 11/06/11  Yes [provider]  levETIRAcetam (KEPPRA) 500 MG tablet TAKE 1 TABLET BY MOUTH TWICE A DAY 03/15/20  Yes Patel, Donika K, DO  Omega-3 Fatty Acids (OMEGA-3 FISH OIL) 300 MG CAPS Take by mouth.   Yes [provider]  oxybutynin (DITROPAN-XL) 5  MG 24 hr tablet TK 1 T PO  QD 04/01/15  Yes [provider]  predniSONE (DELTASONE) 10 MG tablet 50 mg daily x 2 days, then 40 mg daily x 2 days, then 30 mg daily x 2 days, then 20 mg daily x 2 days, then 10 mg daily x 2 days. 09/14/20  Yes Jamesetta Greenhalgh G, DO  aspirin 81 MG tablet Take 81 mg by mouth daily. Patient not taking: Reported on 09/14/2020    [provider]  diclofenac Sodium (VOLTAREN) 1 % GEL Apply 2 g topically 4 (four) times daily. 05/16/20   Rodriguez-Southworth, Sunday Spillers, PA-C  fluticasone (FLONASE) 50 MCG/ACT nasal spray fluticasone propionate 50 mcg/actuation nasal spray,suspension  Fluticasone Propionate 50 MCG/ACT Nasal Suspension QTY: 1 gram Days: 30 Refills: 5  Written: 04/03/19 Patient Instructions: use as directed  2 sprays each nostril qd 04/03/19   [provider]  gabapentin (NEURONTIN) 100 MG capsule Take 300 mg by mouth 2 (two) times daily. 09/30/19 05/16/20  [provider]    Family History Family History  Problem Relation Age of Onset   Hypertension Mother    Kidney failure Mother    Heart disease Mother    Hypertension Father    Cancer Father    Hypertension Sister    Hypertension Sister    Healthy Son    Breast cancer Neg Hx     Social History Social History   Tobacco Use   Smoking status: Never   Smokeless tobacco: Never  Vaping Use   Vaping Use: Never used  Substance Use Topics   Alcohol use: No    Alcohol/week: 0.0 standard drinks   Drug use: No     Allergies   Simvastatin   Review of Systems Review of Systems  Constitutional: Negative.   Musculoskeletal:  Positive for back pain.   Physical Exam Triage Vital Signs ED Triage Vitals  Enc Vitals Group     BP 09/14/20 1114 (!) 146/74     Pulse Rate 09/14/20 1114 (!) 50     Resp 09/14/20 1114 20     Temp 09/14/20 1114 98.5 F (36.9 C)     Temp Source 09/14/20 1114 Oral     SpO2 09/14/20 1114 100 %     Weight --      Height --      Head  Circumference --      Peak Flow --      Pain Score 09/14/20 1110 10     Pain Loc --      Pain Edu? --      Excl. in [place]? --    Updated Vital Signs BP (!) 146/74 (BP Location: Left Arm)   Pulse (!) 50   Temp 98.5 F (36.9 C) (Oral)   Resp 20   LMP  (LMP Unknown)   SpO2 100%   Visual Acuity Right Eye Distance:   Left Eye Distance:  Bilateral Distance:    Right Eye Near:   Left Eye Near:    Bilateral Near:     Physical Exam Vitals and nursing note reviewed.  Constitutional:      General: She is not in acute distress.    Appearance: Normal appearance. She is not ill-appearing.  HENT:     Head: Normocephalic and atraumatic.  Eyes:     General:        Right eye: No discharge.        Left eye: No discharge.     Conjunctiva/sclera: Conjunctivae normal.  Pulmonary:     Effort: Pulmonary effort is normal. No respiratory distress.  Musculoskeletal:     Comments: Mild tenderness of the right lumbar spine.  Neurological:     Mental Status: She is alert.  Psychiatric:        Mood and Affect: Mood normal.        Behavior: Behavior normal.   UC Treatments / Results  Labs (all labs ordered are listed, but only abnormal results are displayed) Labs Reviewed - No data to display  EKG   Radiology No results found.  Procedures Procedures (including critical care time)  Medications Ordered in UC Medications - No data to display  Initial Impression / Assessment and Plan / UC Course  I have reviewed the triage vital signs and the nursing notes.  Pertinent labs & imaging results that were available during my care of the patient were reviewed by me and considered in my medical decision making (see chart for details).    80 year old female presents with acute low back pain. Treating with prednisone.   Final Clinical Impressions(s) / UC Diagnoses   Final diagnoses:  Acute right-sided low back pain without sciatica   Discharge Instructions   None    ED Prescriptions      Medication Sig Dispense Auth. Provider   predniSONE (DELTASONE) 10 MG tablet 50 mg daily x 2 days, then 40 mg daily x 2 days, then 30 mg daily x 2 days, then 20 mg daily x 2 days, then 10 mg daily x 2 days. 30 tablet Coral Spikes, DO      PDMP not reviewed this encounter.   Coral Spikes, [place] 09/14/20 1252

## 2020-09-14 NOTE — ED Triage Notes (Signed)
Right hip pain, "catch" noticed yesterday.  No known injury

## 2020-10-11 ENCOUNTER — Ambulatory Visit: Payer: Medicare Other | Admitting: Internal Medicine

## 2020-11-01 ENCOUNTER — Other Ambulatory Visit: Payer: Self-pay | Admitting: Internal Medicine

## 2020-11-17 ENCOUNTER — Other Ambulatory Visit: Payer: Self-pay | Admitting: Internal Medicine

## 2020-11-17 ENCOUNTER — Other Ambulatory Visit: Payer: Self-pay | Admitting: Family

## 2020-11-17 DIAGNOSIS — Z1231 Encounter for screening mammogram for malignant neoplasm of breast: Secondary | ICD-10-CM

## 2020-11-24 ENCOUNTER — Ambulatory Visit: Payer: Medicare Other | Admitting: Internal Medicine

## 2020-12-01 ENCOUNTER — Ambulatory Visit
Admission: RE | Admit: 2020-12-01 | Discharge: 2020-12-01 | Disposition: A | Discharge status: Home / Self Care | Payer: Medicare Other | Source: Ambulatory Visit | Attending: Family | Admitting: Family

## 2020-12-01 ENCOUNTER — Other Ambulatory Visit: Payer: Self-pay

## 2020-12-01 DIAGNOSIS — Z1231 Encounter for screening mammogram for malignant neoplasm of breast: Secondary | ICD-10-CM | POA: Diagnosis not present

## 2021-01-27 ENCOUNTER — Ambulatory Visit: Payer: Medicare Other | Admitting: Neurology

## 2021-03-20 ENCOUNTER — Other Ambulatory Visit: Payer: Self-pay | Admitting: Neurology

## 2021-03-20 DIAGNOSIS — G40909 Epilepsy, unspecified, not intractable, without status epilepticus: Secondary | ICD-10-CM

## 2021-04-03 ENCOUNTER — Encounter: Payer: Self-pay | Admitting: Ophthalmology

## 2021-04-11 NOTE — Discharge Instructions (Signed)

## 2021-04-12 ENCOUNTER — Ambulatory Visit
Admission: RE | Admit: 2021-04-12 | Discharge: 2021-04-12 | Disposition: A | Discharge status: Home / Self Care | Payer: Medicare Other | Attending: Ophthalmology | Admitting: Ophthalmology

## 2021-04-12 ENCOUNTER — Encounter: Payer: Self-pay | Admitting: Ophthalmology

## 2021-04-12 ENCOUNTER — Ambulatory Visit: Payer: Medicare Other | Admitting: Anesthesiology

## 2021-04-12 ENCOUNTER — Encounter
Admission: RE | Disposition: A | Discharge status: Home / Self Care | Payer: Self-pay | Source: Home / Self Care | Attending: Ophthalmology

## 2021-04-12 ENCOUNTER — Other Ambulatory Visit: Payer: Self-pay

## 2021-04-12 DIAGNOSIS — H2512 Age-related nuclear cataract, left eye: Secondary | ICD-10-CM | POA: Diagnosis present

## 2021-04-12 DIAGNOSIS — H409 Unspecified glaucoma: Secondary | ICD-10-CM | POA: Insufficient documentation

## 2021-04-12 DIAGNOSIS — K219 Gastro-esophageal reflux disease without esophagitis: Secondary | ICD-10-CM | POA: Diagnosis not present

## 2021-04-12 DIAGNOSIS — I129 Hypertensive chronic kidney disease with stage 1 through stage 4 chronic kidney disease, or unspecified chronic kidney disease: Secondary | ICD-10-CM | POA: Diagnosis not present

## 2021-04-12 DIAGNOSIS — Z6826 Body mass index (BMI) 26.0-26.9, adult: Secondary | ICD-10-CM | POA: Diagnosis not present

## 2021-04-12 DIAGNOSIS — M109 Gout, unspecified: Secondary | ICD-10-CM | POA: Insufficient documentation

## 2021-04-12 DIAGNOSIS — N183 Chronic kidney disease, stage 3 unspecified: Secondary | ICD-10-CM | POA: Diagnosis not present

## 2021-04-12 DIAGNOSIS — R569 Unspecified convulsions: Secondary | ICD-10-CM | POA: Insufficient documentation

## 2021-04-12 HISTORY — PX: CATARACT EXTRACTION W/PHACO: SHX586

## 2021-04-12 SURGERY — PHACOEMULSIFICATION, CATARACT, WITH IOL INSERTION
Anesthesia: Monitor Anesthesia Care | Site: Eye | Laterality: Left

## 2021-04-12 MED ORDER — SIGHTPATH DOSE#1 NA HYALUR & NA CHOND-NA HYALUR IO KIT
PACK | INTRAOCULAR | Status: DC | PRN
  Administered 2021-04-12: 1 via OPHTHALMIC

## 2021-04-12 MED ORDER — CEFUROXIME OPHTHALMIC INJECTION 1 MG/0.1 ML
INJECTION | OPHTHALMIC | Status: DC | PRN
  Administered 2021-04-12: 0.1 mL via INTRACAMERAL

## 2021-04-12 MED ORDER — MIDAZOLAM HCL 2 MG/2ML IJ SOLN
INTRAMUSCULAR | Status: DC | PRN
  Administered 2021-04-12: 1 mg via INTRAVENOUS

## 2021-04-12 MED ORDER — FENTANYL CITRATE (PF) 100 MCG/2ML IJ SOLN
INTRAMUSCULAR | Status: DC | PRN
Start: 2021-04-12 — End: 2021-04-12
  Administered 2021-04-12: 50 ug via INTRAVENOUS

## 2021-04-12 MED ORDER — ARMC OPHTHALMIC DILATING DROPS
1.0000 "application " | OPHTHALMIC | Status: DC | PRN
  Administered 2021-04-12 (×3): 1 via OPHTHALMIC

## 2021-04-12 MED ORDER — SIGHTPATH DOSE#1 BSS IO SOLN
INTRAOCULAR | Status: DC | PRN
  Administered 2021-04-12: 2 mL

## 2021-04-12 MED ORDER — LACTATED RINGERS IV SOLN: INTRAVENOUS | Status: DC

## 2021-04-12 MED ORDER — OXYCODONE HCL 5 MG PO TABS: 5.0000 mg | ORAL_TABLET | Freq: Once | ORAL | Status: DC | PRN

## 2021-04-12 MED ORDER — SIGHTPATH DOSE#1 BSS IO SOLN
INTRAOCULAR | Status: DC | PRN
  Administered 2021-04-12: 15 mL via INTRAOCULAR

## 2021-04-12 MED ORDER — SIGHTPATH DOSE#1 BSS IO SOLN
INTRAOCULAR | Status: DC | PRN
  Administered 2021-04-12: 94 mL via OPHTHALMIC

## 2021-04-12 MED ORDER — OXYCODONE HCL 5 MG/5ML PO SOLN: 5.0000 mg | Freq: Once | ORAL | Status: DC | PRN

## 2021-04-12 MED ORDER — BRIMONIDINE TARTRATE-TIMOLOL 0.2-0.5 % OP SOLN
OPHTHALMIC | Status: DC | PRN
  Administered 2021-04-12: 1 [drp] via OPHTHALMIC

## 2021-04-12 MED ORDER — TETRACAINE HCL 0.5 % OP SOLN
1.0000 [drp] | OPHTHALMIC | Status: DC | PRN
  Administered 2021-04-12 (×3): 1 [drp] via OPHTHALMIC

## 2021-04-12 SURGICAL SUPPLY — 14 items
CANNULA ANT/CHMB 27G (MISCELLANEOUS) IMPLANT
CANNULA ANT/CHMB 27GA (MISCELLANEOUS) IMPLANT
CATARACT SUITE SIGHTPATH (MISCELLANEOUS) ×2 IMPLANT
FEE CATARACT SUITE SIGHTPATH (MISCELLANEOUS) ×1 IMPLANT
GLOVE SRG 8 PF TXTR STRL LF DI (GLOVE) ×1 IMPLANT
GLOVE SURG ENC TEXT LTX SZ7.5 (GLOVE) ×2 IMPLANT
GLOVE SURG UNDER POLY LF SZ8 (GLOVE) ×2
LENS IOL DIOP 20.0 (Intraocular Lens) ×2 IMPLANT
LENS IOL TECNIS MONO 20.0 (Intraocular Lens) IMPLANT
NDL FILTER BLUNT 18X1 1/2 (NEEDLE) ×1 IMPLANT
NEEDLE FILTER BLUNT 18X 1/2SAF (NEEDLE) ×1
NEEDLE FILTER BLUNT 18X1 1/2 (NEEDLE) ×1 IMPLANT
SYR 3ML LL SCALE MARK (SYRINGE) ×2 IMPLANT
WATER STERILE IRR 250ML POUR (IV SOLUTION) ×2 IMPLANT

## 2021-04-12 NOTE — Anesthesia Preprocedure Evaluation (Addendum)
Anesthesia Evaluation  ?Patient identified by MRN, date of birth, ID band ?Patient awake ? ? ? ?Reviewed: ?NPO status  ? ?History of Anesthesia Complications ?Negative for: history of anesthetic complications ? ?Airway ?Mallampati: II ? ?TM Distance: >3 FB ?Neck ROM: full ? ? ? Dental ?no notable dental hx. ? ?  ?Pulmonary ?neg pulmonary ROS, neg sleep apnea,  ?  ?Pulmonary exam normal ? ? ? ? ? ? ? Cardiovascular ?Exercise Tolerance: Good ?hypertension, Normal cardiovascular exam ? ? ?  ?Neuro/Psych ?Seizures - (x1 2017),  Depression glaucoma ?  ? GI/Hepatic ?Neg liver ROS, GERD  Controlled,  ?Endo/Other  ?Morbid obesity (bmi 27) ? Renal/GU ?CRFRenal disease (ckd3) Bladder dysfunction ? ?Drop foot L ? ?  ?Musculoskeletal ? ?(+) Arthritis , Gout; ? ?S/P cervical spinal fusion  ? Abdominal ?  ?Peds ? Hematology ?negative hematology ROS ?(+)   ?Anesthesia Other Findings ?echo: 2019: ef=60%; ? ? Reproductive/Obstetrics ? ?  ? ? ? ? ? ? ? ? ? ? ? ? ? ?  ?  ? ? ? ? ? ? ? ?Anesthesia Physical ?Anesthesia Plan ? ?ASA: 3 ? ?Anesthesia Plan: MAC  ? ?Post-op Pain Management:   ? ?Induction:  ? ?PONV Risk Score and Plan: 2 and TIVA and Midazolam ? ?Airway Management Planned:  ? ?Additional Equipment:  ? ?Intra-op Plan:  ? ?Post-operative Plan:  ? ?Informed Consent: I have reviewed the patients History and Physical, chart, labs and discussed the procedure including the risks, benefits and alternatives for the proposed anesthesia with the patient or authorized representative who has indicated his/her understanding and acceptance.  ? ? ? ? ? ?Plan Discussed with: CRNA ? ?Anesthesia Plan Comments:   ? ? ? ? ? ? ?Anesthesia Quick Evaluation ? ?

## 2021-04-12 NOTE — H&P (Signed)
[place]  ? ?Primary Care Physician:  Perrin Maltese, MD ?Ophthalmologist: Dr. Leandrew Koyanagi ? ?Pre-Procedure History & Physical: ?HPI:  Anna Chambers is a 81 y.o. female here for ophthalmic surgery. ?  ?Past Medical History:  ?Diagnosis Date  ? 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HCC)   ? Depression   ? Hypertension   ? Seizure ([place]) 02/25/2017  ? Sleep apnea   ? Syncope and collapse 02/25/2017  ? ? ?Past Surgical History:  ?Procedure Laterality Date  ? CATARACT EXTRACTION    ? COLONOSCOPY    ? COLONOSCOPY WITH PROPOFOL N/A 12/04/2018  ? Procedure: COLONOSCOPY WITH PROPOFOL;  Surgeon: Toledo, Benay Pike, MD;  Location: [facility] ENDOSCOPY;  Service: Gastroenterology;  Laterality: N/A;  ? EYE SURGERY    ? FOOT SURGERY Left   ? NECK SURGERY    ? SPINE SURGERY    ? ACDF C3-4  ? VAGINAL HYSTERECTOMY    ? ? ?Prior to Admission medications   ?Medication Sig Start Date End Date Taking? Authorizing Provider  ?allopurinol (ZYLOPRIM) 100 MG tablet daily as needed. 09/02/18  Yes [provider]  ?aspirin 81 MG tablet Take 81 mg by mouth daily.   Yes [provider]  ?b complex vitamins tablet Take 1 tablet by mouth daily.   Yes [provider]  ?brimonidine-timolol (COMBIGAN) 0.2-0.5 % ophthalmic solution Administer 10 drops to both eyes Two (2) times a day. Frequency:PHARMDIR   Dosage:52m     Instructions:  Note:Instill one drop in each eye twice daily. Dose: 1 05/03/09  Yes [provider]  ?brinzolamide (AZOPT) 1 % ophthalmic suspension Administer 1 drop to both eyes Two (2) times a day (at 8am and 5pm). Frequency:PHARMDIR   Dosage:0.0     Instructions:  Note:Instill one drop in each eye twice a day. Dose: 1 05/03/09  Yes [provider]  ?CHOLECALCIFEROL PO Take 500 Units by mouth 2 (two) times a week.   Yes [provider]  ?famotidine (PEPCID) 20 MG tablet Take 20 mg by mouth daily.   Yes [provider]  ?fluticasone (FLONASE) 50  MCG/ACT nasal spray fluticasone propionate 50 mcg/actuation nasal spray,suspension ? Fluticasone Propionate 50 MCG/ACT Nasal Suspension QTY: 1 gram Days: 30 Refills: 5  Written: 04/03/19 Patient Instructions: use as directed  2 sprays each nostril qd 04/03/19  Yes [provider]  ?gabapentin (NEURONTIN) 100 MG capsule Take 100 mg by mouth 3 (three) times daily.   Yes [provider]  ?hydrochlorothiazide (HYDRODIURIL) 12.5 MG tablet Take 12.5 mg by mouth daily.   Yes [provider]  ?latanoprost (XALATAN) 0.005 % ophthalmic solution Administer 2.5 mL to both eyes nightly. Frequency:QD   Dosage:0.0     Instructions:  Note:Dose: 0.005 % 11/06/11  Yes [provider]  ?levETIRAcetam (KEPPRA) 500 MG tablet TAKE 1 TABLET BY MOUTH TWICE A DAY 03/20/21  Yes Patel, Donika K, DO  ?losartan (COZAAR) 25 MG tablet Take 25 mg by mouth daily.   Yes [provider]  ?oxybutynin (DITROPAN-XL) 5 MG 24 hr tablet TK 1 T PO  QD 04/01/15  Yes [provider]  ?vitamin C (ASCORBIC ACID) 500 MG tablet Take 500 mg by mouth.   Yes [provider]  ?Omega-3 Fatty Acids (OMEGA-3 FISH OIL) 300 MG CAPS Take by mouth. ?Patient not taking: Reported on 04/03/2021    [provider]  ? ? ?Allergies as of 03/08/2021 - Review Complete 09/14/2020  ?Allergen Reaction Noted  ? Simvastatin Rash  03/26/2014  ? ? ?Family History  ?Problem Relation Age of Onset  ? Hypertension Mother   ? Kidney failure Mother   ? Heart disease Mother   ? Hypertension Father   ? Cancer Father   ? Hypertension Sister   ? Hypertension Sister   ? Healthy Son   ? Breast cancer Neg Hx   ? ? ?Social History  ? ?Socioeconomic History  ? Marital status: Married  ?  Spouse name: Not on file  ? Number of children: 1  ? Years of education: Not on file  ? Highest education level: Not on file  ?Occupational History  ? Occupation: retired  ?Tobacco Use  ? Smoking status: Never  ? Smokeless tobacco: Never  ?Vaping Use  ?  Vaping Use: Never used  ?Substance and Sexual Activity  ? Alcohol use: No  ?  Alcohol/week: 0.0 standard drinks  ? Drug use: No  ? Sexual activity: Not on file  ?Other Topics Concern  ? Not on file  ?Social History Narrative  ? Lives with husband in a one story home.  Has one child.    ? Retired from  [employer] and [employer].    ? Right handed  ? ?Social Determinants of Health  ? ?Financial Resource Strain: Not on file  ?Food Insecurity: Not on file  ?Transportation Needs: Not on file  ?Physical Activity: Not on file  ?Stress: Not on file  ?Social Connections: Not on file  ?Intimate Partner Violence: Not on file  ? ? ?Review of Systems: ?See HPI, otherwise negative ROS ? ?Physical Exam: ?BP (!) 108/59   Pulse (!) 54   Temp (!) 97 ?F (36.1 ?C) (Temporal)   Resp 20   Ht '5\' 4"'$  (1.626 m)   Wt 70.8 kg   LMP  (LMP Unknown)   SpO2 98%   BMI 26.78 kg/m?  ?General:   Alert,  pleasant and cooperative in NAD ?Head:  Normocephalic and atraumatic. ?Lungs:  Clear to auscultation.    ?Heart:  Regular rate and rhythm.  ? ?Impression/Plan: ?Anna Chambers is here for ophthalmic surgery. ? ?Risks, benefits, limitations, and alternatives regarding ophthalmic surgery have been reviewed with the patient.  Questions have been answered.  All parties agreeable. ? ? Leandrew Koyanagi, MD  04/12/2021, 11:11 AM ? ?

## 2021-04-12 NOTE — Op Note (Signed)
OPERATIVE NOTE ? ?Anna Chambers ?[id number] ?04/12/2021 ? ? ?PREOPERATIVE DIAGNOSIS:  Nuclear sclerotic cataract left eye. H25.12 ?  ?POSTOPERATIVE DIAGNOSIS:    Nuclear sclerotic cataract left eye.   ?  ?PROCEDURE:  Phacoemusification with posterior chamber intraocular lens placement of the left eye  ?Ultrasound time: Procedure(s): ?CATARACT EXTRACTION PHACO AND INTRAOCULAR LENS PLACEMENT (IOC) LEFT 8.12 01:13.1 (Left) ? ?LENS:   ?Implant Name Type Inv. Item Serial No. Manufacturer Lot No. LRB No. Used Action  ?LENS IOL DIOP 20.0 - [id number] Intraocular Lens LENS IOL DIOP 20.0 [id number] [employer]  Left 1 Implanted  ?   ? ?SURGEON:  Wyonia Hough, MD ?  ?ANESTHESIA:  Topical with tetracaine drops and 2% Xylocaine jelly, augmented with 1% preservative-free intracameral lidocaine. ? ?  ?COMPLICATIONS:  None. ?  ?DESCRIPTION OF PROCEDURE:  The patient was identified in the holding room and transported to the operating room and placed in the supine position under the operating microscope.  The left eye was identified as the operative eye and it was prepped and draped in the usual sterile ophthalmic fashion. ?  ?A 1 millimeter clear-corneal paracentesis was made at the 1:30 position.  0.5 ml of preservative-free 1% lidocaine was injected into the anterior chamber. ? The anterior chamber was filled with Viscoat viscoelastic.  A 2.4 millimeter keratome was used to make a near-clear corneal incision at the 10:30 position.  .  A curvilinear capsulorrhexis was made with a cystotome and capsulorrhexis forceps.  Balanced salt solution was used to hydrodissect and hydrodelineate the nucleus. ?  ?Phacoemulsification was then used in stop and chop fashion to remove the lens nucleus and epinucleus.  Additional viscoat was added several times during phacoemulsification.  The remaining cortex was then removed using the irrigation and aspiration handpiece. Provisc was then placed into the capsular bag to distend it for lens  placement.  A lens was then injected into the capsular bag.  The remaining viscoelastic was aspirated. ?  ?Wounds were hydrated with balanced salt solution.  The anterior chamber was inflated to a physiologic pressure with balanced salt solution.  No wound leaks were noted. Cefuroxime 0.1 ml of a '10mg'$ /ml solution was injected into the anterior chamber for a dose of 1 mg of intracameral antibiotic at the completion of the case. ?  Timolol and Brimonidine drops were applied to the eye.  The patient was taken to the recovery room in stable condition without complications of anesthesia or surgery. ? ?[name] ?04/12/2021, 12:34 PM ? ?

## 2021-04-12 NOTE — Anesthesia Procedure Notes (Signed)
Procedure Name: [place] ?Date/Time: 04/12/2021 12:09 PM ?Performed by: Cameron Ali, CRNA ?Pre-anesthesia Checklist: Patient identified, Emergency Drugs available, Suction available, Timeout performed and Patient being monitored ?Patient Re-evaluated:Patient Re-evaluated prior to induction ?Oxygen Delivery Method: Nasal cannula ?Placement Confirmation: positive ETCO2 ? ? ? ? ?

## 2021-04-12 NOTE — Anesthesia Postprocedure Evaluation (Signed)
Anesthesia Post Note ? ?Patient: Anna Chambers ? ?Procedure(s) Performed: CATARACT EXTRACTION PHACO AND INTRAOCULAR LENS PLACEMENT (IOC) LEFT 8.12 01:13.1 (Left: Eye) ? ? ?  ?Patient location during evaluation: PACU ?Anesthesia Type: MAC ?Level of consciousness: awake and alert ?Pain management: pain level controlled ?Vital Signs Assessment: post-procedure vital signs reviewed and stable ?Respiratory status: spontaneous breathing, nonlabored ventilation, respiratory function stable and patient connected to nasal cannula oxygen ?Cardiovascular status: stable and blood pressure returned to baseline ?Postop Assessment: no apparent nausea or vomiting ?Anesthetic complications: no ? ? ?No notable events documented. ? ?Fidel Levy ? ? ? ? ? ?

## 2021-04-12 NOTE — Transfer of Care (Signed)
Immediate Anesthesia Transfer of Care Note ? ?Patient: Anna Chambers ? ?Procedure(s) Performed: CATARACT EXTRACTION PHACO AND INTRAOCULAR LENS PLACEMENT (IOC) LEFT 8.12 01:13.1 (Left: Eye) ? ?Patient Location: PACU ? ?Anesthesia Type: MAC ? ?Level of Consciousness: awake, alert  and patient cooperative ? ?Airway and Oxygen Therapy: Patient Spontanous Breathing and Patient connected to supplemental oxygen ? ?Post-op Assessment: Post-op Vital signs reviewed, Patient's Cardiovascular Status Stable, Respiratory Function Stable, Patent Airway and No signs of Nausea or vomiting ? ?Post-op Vital Signs: Reviewed and stable ? ?Complications: No notable events documented. ? ?

## 2021-04-13 ENCOUNTER — Encounter: Payer: Self-pay | Admitting: Ophthalmology

## 2021-04-17 ENCOUNTER — Ambulatory Visit: Payer: Medicare Other | Admitting: Neurology

## 2021-06-20 ENCOUNTER — Telehealth: Payer: Self-pay | Admitting: Neurology

## 2021-06-20 NOTE — Telephone Encounter (Signed)
The following message was left with [employer] on 06/20/21 at 12:47 PM. ? ?Caller states that she have numbness on the left side of her body for the last two weeks. ? ?The pain is going down her left arm and going all the way down to left leg. She also reports numbness in her left fingers and toes. ? ?Caller advised to proceed to ED now. Caller expressed understanding. ? ?

## 2021-06-20 NOTE — Telephone Encounter (Signed)
Called patient x2 and line is busy. Need to inform patient to go to the ER. Patient not seen since 2021.  ?

## 2021-06-20 NOTE — Telephone Encounter (Signed)
Called patient and advised her that she will need to go to the emergency room to be evaluated. Informed patient that she has not been seen in office since 2021. Patient verbalized understanding that we have advised her to seek care at the ER.  ?

## 2021-07-12 ENCOUNTER — Ambulatory Visit (INDEPENDENT_AMBULATORY_CARE_PROVIDER_SITE_OTHER): Payer: Medicare Other | Admitting: Podiatry

## 2021-07-12 ENCOUNTER — Encounter: Payer: Self-pay | Admitting: Podiatry

## 2021-07-12 ENCOUNTER — Ambulatory Visit (INDEPENDENT_AMBULATORY_CARE_PROVIDER_SITE_OTHER): Payer: Medicare Other

## 2021-07-12 DIAGNOSIS — R269 Unspecified abnormalities of gait and mobility: Secondary | ICD-10-CM

## 2021-07-12 DIAGNOSIS — M19072 Primary osteoarthritis, left ankle and foot: Secondary | ICD-10-CM

## 2021-07-12 DIAGNOSIS — M2042 Other hammer toe(s) (acquired), left foot: Secondary | ICD-10-CM | POA: Diagnosis not present

## 2021-07-12 DIAGNOSIS — R2689 Other abnormalities of gait and mobility: Secondary | ICD-10-CM

## 2021-07-12 NOTE — Progress Notes (Signed)
  Subjective:  Patient ID: Anna Chambers, female    DOB: 1940/02/27,  MRN: [id number]  Chief Complaint  Patient presents with   Foot Pain    "I have a drop toe and my ankle swells."    81 y.o. female presents with the above complaint. History confirmed with patient.  This causes balance issues and feels that she has difficulty walking.  Her PCP ordered physical therapy for her and this is starting next week  Objective:  Physical Exam: warm, good capillary refill, no trophic changes or ulcerative lesions, normal DP and PT pulses, normal sensory exam, and she has good 5 out of 5 strength bilateral with no evidence of dropfoot she does have a antalgic gait pattern and walks with a cane.   Radiographs: Multiple views x-ray of the left foot: No acute changes since last exam there are mild scattered degenerative changes throughout the forefoot and moderate to severe in the talonavicular joint and navicular cuneiform joint, she has mild hallux valgus deformity Assessment:   1. Balance disorder   2. 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  As we have previously discussed I think the swelling is likely related to midtarsal osteoarthritis.  I do not expect that this is what is causing her balance disorder because she does not really have pain when she walks that she says.  She is beginning physical therapy soon I think this will give her the most benefit of any treatment.  She says she has a previous brace and it sounds like this may be a dropfoot brace but I do not see any clinical evidence that she actually has dropfoot currently.  It is possible a [place]mister balance brace may be beneficial for her but I will have her scheduled to see our orthotist for fitting for this and see if there are any better options to help improve her mobility.  Return if symptoms worsen or fail to improve.

## 2021-07-24 ENCOUNTER — Ambulatory Visit: Payer: Medicare Other

## 2021-07-24 DIAGNOSIS — R2689 Other abnormalities of gait and mobility: Secondary | ICD-10-CM

## 2021-07-24 DIAGNOSIS — R269 Unspecified abnormalities of gait and mobility: Secondary | ICD-10-CM

## 2021-07-24 NOTE — Progress Notes (Signed)
SITUATION Reason for Consult: Follow-up with [employer] Patient / Caregiver Report: Patient reports she received the device one year ago  OBJECTIVE DATA History / Diagnosis:    ICD-10-CM   1. Neurologic gait dysfunction  R26.9     2. Balance disorder  R26.89       Change in Pathology: None  ACTIONS PERFORMED Patient's equipment was checked for structural stability and fit. Informed patient of Medicare's same-or-similar policy and that the device she has should be under [employer] warranty, and that they should change the trimlines to allow it to fit into a shoe, because the brace itself is functional, just a Cicily Bonano excessive as far as amount of plastic on the device. All questions answered and concerns addressed.  PLAN Follow-up as needed (PRN). Plan of care discussed with and agreed upon by patient / caregiver.
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
## 2021-09-11 ENCOUNTER — Telehealth: Payer: Self-pay

## 2021-09-11 ENCOUNTER — Other Ambulatory Visit: Payer: Self-pay

## 2021-09-11 ENCOUNTER — Ambulatory Visit (INDEPENDENT_AMBULATORY_CARE_PROVIDER_SITE_OTHER): Payer: Medicare Other | Admitting: Neurology

## 2021-09-11 ENCOUNTER — Encounter: Payer: Self-pay | Admitting: Neurology

## 2021-09-11 VITALS — BP 141/81 | HR 58 | Ht 64.0 in | Wt 157.0 lb

## 2021-09-11 DIAGNOSIS — Z981 Arthrodesis status: Secondary | ICD-10-CM | POA: Diagnosis not present

## 2021-09-11 DIAGNOSIS — H02401 Unspecified ptosis of right eyelid: Secondary | ICD-10-CM | POA: Diagnosis not present

## 2021-09-11 DIAGNOSIS — M5417 Radiculopathy, lumbosacral region: Secondary | ICD-10-CM | POA: Diagnosis not present

## 2021-09-11 DIAGNOSIS — M21372 Foot drop, left foot: Secondary | ICD-10-CM

## 2021-09-11 DIAGNOSIS — G40909 Epilepsy, unspecified, not intractable, without status epilepticus: Secondary | ICD-10-CM

## 2021-09-11 NOTE — Patient Instructions (Signed)
Electroencephalogram (EEG) Instructions  To prepare for your EEG: Wash your hair the night before or the day of the test, but don't use any conditioners, hair creams, sprays or styling gels.  Avoid anything with caffeine 6 hours before the test. Take your usual medications unless instructed otherwise. If you're supposed to sleep during your EEG test, your doctor may ask you to sleep less or even avoid sleep entirely the night before your EEG. If you have trouble falling asleep for the test, you might be given a sedative to help you relax.   What to expect You'll feel little or no discomfort during an EEG. The electrodes don't transmit any sensations. They just record your brain waves. If you need to sleep during the EEG, you might be given a sedative beforehand to help you relax. During the test: A technician measures your head and marks your scalp with a type of pencil, to indicate where to attach the electrodes. Those spots on your scalp may be scrubbed with a gritty cream to improve the quality of the recording.  A technician attaches flat metal discs (electrodes) to your scalp using a special adhesive. The electrodes are connected with wires to an instrument that amplifies - makes bigger - the brain waves and records them on computer equipment. Some people wear and elastic cap fitted with electrodes, instead of having the adhesive applied to their scalps. Once the electrodes are in place, an EEG typically takes 30-60 minutes.  You relax in a comfortable position with your eyes closed during the test. At various times, the technician may ask you to open and close your eyes, perform a few simple calculations, read a paragraph, look at a picture, breathe deeply (hyperventilate) for a few minutes, or look at a flashing light.  After the test After the test, the technician removes the electrodes or cap. If no sedative was given, you should feel no side effects after the procedure, and you can return to  your normal routine.  If you used a sedative, it may take about an hour to partially recover from the medication. You'll need someone to take you home because it can take up to a day for the full effects of the sedative to wear off. Rest and don't drive for the remainder of the day.

## 2021-09-11 NOTE — Telephone Encounter (Signed)
Patient called back.  I let her know to come in this week for labs.  She said she would come sometime Thursday.

## 2021-09-11 NOTE — Telephone Encounter (Signed)
Called patient and left a message with her husband to have her give me a call.   When patient calls need to inform her that we need to have her come to the office to have labs drawn. We forgot to let her know that Dr. Posey Pronto wants her to have labs done. Patient may come in at anytime to have it done.

## 2021-09-14 ENCOUNTER — Other Ambulatory Visit (INDEPENDENT_AMBULATORY_CARE_PROVIDER_SITE_OTHER): Payer: Medicare Other

## 2021-09-14 DIAGNOSIS — M21372 Foot drop, left foot: Secondary | ICD-10-CM

## 2021-09-14 DIAGNOSIS — H02401 Unspecified ptosis of right eyelid: Secondary | ICD-10-CM | POA: Diagnosis not present

## 2021-09-14 LAB — TSH: TSH: 2.24 u[IU]/mL (ref 0.35–5.50)

## 2021-09-25 ENCOUNTER — Telehealth: Payer: Self-pay | Admitting: Neurology

## 2021-09-25 ENCOUNTER — Other Ambulatory Visit: Payer: Self-pay

## 2021-09-25 NOTE — Telephone Encounter (Signed)
Pt called informed that Thyroid is normal and that the myasthenia panel is still processing

## 2021-09-25 NOTE — Telephone Encounter (Signed)
Pt called in wanting to see if the results of her lab work came in yet from 09/14/21.

## 2021-09-25 NOTE — Telephone Encounter (Signed)
Thyroid is normal.  Myasthenia panel says "active" - can you follow-up to see when it will be processed?  Thanks.

## 2021-09-29 LAB — MYASTHENIA GRAVIS PANEL 2
A CHR BINDING ABS: <0.3 nmol/L
ACHR Blocking Abs: <15 % Inhibition (ref <15)
Acetylchol Modul Ab: 24 % Inhibition

## 2021-10-03 ENCOUNTER — Other Ambulatory Visit: Payer: Self-pay | Admitting: Internal Medicine

## 2021-10-03 DIAGNOSIS — Z1231 Encounter for screening mammogram for malignant neoplasm of breast: Secondary | ICD-10-CM

## 2021-10-25 ENCOUNTER — Encounter: Payer: Self-pay | Admitting: Neurology

## 2021-11-27 ENCOUNTER — Ambulatory Visit
Admission: EM | Admit: 2021-11-27 | Discharge: 2021-11-27 | Disposition: A | Discharge status: Home / Self Care | Payer: Medicare Other | Attending: Physician Assistant | Admitting: Physician Assistant

## 2021-11-27 ENCOUNTER — Encounter: Payer: Self-pay | Admitting: Emergency Medicine

## 2021-11-27 ENCOUNTER — Other Ambulatory Visit: Payer: Self-pay

## 2021-11-27 DIAGNOSIS — M545 Low back pain, unspecified: Secondary | ICD-10-CM | POA: Diagnosis not present

## 2021-11-27 DIAGNOSIS — G8929 Other chronic pain: Secondary | ICD-10-CM

## 2021-11-27 MED ORDER — TRAMADOL HCL 50 MG PO TABS: 50.0000 mg | ORAL_TABLET | Freq: Three times a day (TID) | ORAL | 0 refills | Status: AC | PRN

## 2021-11-27 NOTE — ED Provider Notes (Signed)
[facility]    CSN: [id number] Arrival date & time: 11/27/21  1125      History   Chief Complaint Chief Complaint  Patient presents with   Back Pain    HPI Anna Chambers is a 81 y.o. female presenting for right lower back pain x3 days.  She denies any injury or change in her physical activity level.  She says pain does not radiate.  Denies any pain in her buttocks, hip, groin or to the leg.  No numbness, weakness or tingling of the right lower extremity.  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  She reports taking gabapentin for this and also takes Tylenol.  Patient says those medications are not really helping her right lower back pain at this time and she wonders if she can get a shot for the pain.  She says pain is about 7-8 out of 10 and constant, worse with trying to get up and walk.  No loss of bowel or bladder control.  Denies any urinary symptoms, fever, vomiting, etc.  Other medical history significant for hypertension, seizures, sleep apnea and patient has a history of cervical spinal surgery.  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   Chronic kidney disease 01/22/2019   Hypertension 01/22/2019   Sleep apnea 01/22/2019   History of poliomyelitis 09/24/2018   Hx of adenomatous colonic polyps 09/24/2018   Vitamin B12 deficiency (non anemic) 09/02/2018   Syncope 02/25/2017   Cervical myelopathy with  cervical radiculopathy ([place]) 01/26/2016   Lumbosacral radiculopathy 01/26/2016   S/P cervical spinal fusion 01/26/2016   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   Obesity (BMI 30-39.9) 12/07/2014   Thoracic or lumbosacral neuritis or radiculitis 12/03/2011   Cervical spondylosis with myelopathy 12/03/2011   Shortness of breath 05/08/2011   DDD (degenerative disc disease), cervical 01/17/2011   Low back pain 01/09/2011   Pain in joints 10/23/2010    Past Surgical History:  Procedure Laterality Date   CATARACT EXTRACTION     CATARACT EXTRACTION W/PHACO Left 04/12/2021   Procedure: CATARACT EXTRACTION PHACO AND INTRAOCULAR LENS PLACEMENT ([place]) LEFT 8.12 01:13.1;  Surgeon: Leandrew Koyanagi, MD;  Location: [place];  Service: Ophthalmology;  Laterality: Left;   COLONOSCOPY     COLONOSCOPY WITH PROPOFOL N/A 12/04/2018   Procedure: COLONOSCOPY WITH PROPOFOL;  Surgeon: Toledo, Benay Pike, MD;  Location: [facility];  Service: Gastroenterology;  Laterality: N/A;   EYE SURGERY     FOOT SURGERY Left    NECK SURGERY     SPINE SURGERY     ACDF C3-4   VAGINAL HYSTERECTOMY      OB History   No obstetric history on file.      Home Medications    Prior to Admission medications   Medication Sig Start Date End Date Taking? Authorizing Provider  traMADol (ULTRAM) 50 MG tablet Take 1 tablet (50 mg total) by mouth every 8 (eight) hours as needed  for up to 5 days. 11/27/21 12/02/21 Yes Danton Clap, PA-C  allopurinol (ZYLOPRIM) 100 MG tablet daily as needed. 09/02/18   [provider]  aspirin 81 MG tablet Take 81 mg by mouth daily.    [provider]  b complex vitamins tablet Take 1 tablet by mouth daily. Patient not taking: Reported on 09/11/2021    [provider]  brimonidine-timolol (COMBIGAN) 0.2-0.5 % ophthalmic solution Administer 10 drops to both eyes Two (2) times a day. Frequency:PHARMDIR   Dosage:36m     Instructions:  Note:Instill one drop in each  eye twice daily. Dose: 1 05/03/09   [provider]  brinzolamide (AZOPT) 1 % ophthalmic suspension Administer 1 drop to both eyes Two (2) times a day (at 8am and 5pm). Frequency:PHARMDIR   Dosage:0.0     Instructions:  Note:Instill one drop in each eye twice a day. Dose: 1 05/03/09   [provider]  CHOLECALCIFEROL PO Take 500 Units by mouth 2 (two) times a week. Patient not taking: Reported on 09/11/2021    [provider]  famotidine (PEPCID) 20 MG tablet Take 20 mg by mouth daily.    [provider]  fluticasone (FLONASE) 50 MCG/ACT nasal spray fluticasone propionate 50 mcg/actuation nasal spray,suspension  Fluticasone Propionate 50 MCG/ACT Nasal Suspension QTY: 1 gram Days: 30 Refills: 5  Written: 04/03/19 Patient Instructions: use as directed  2 sprays each nostril qd Patient not taking: Reported on 09/11/2021 04/03/19   [provider]  gabapentin (NEURONTIN) 100 MG capsule Take 100 mg by mouth 3 (three) times daily. Takes 3 times a WPhysicist, medical Historical, MD  hydrochlorothiazide (HYDRODIURIL) 12.5 MG tablet Take 12.5 mg by mouth daily. Take one tablet 3 times a week Patient not taking: Reported on 11/27/2021    [provider]  latanoprost (XALATAN) 0.005 % ophthalmic solution Administer 2.5 mL to both eyes nightly. Frequency:QD   Dosage:0.0     Instructions:  Note:Dose: 0.005 % 11/06/11   [provider]  levETIRAcetam (KEPPRA) 500 MG tablet TAKE 1 TABLET BY MOUTH TWICE A DAY 03/20/21   Patel, Donika K, DO  losartan (COZAAR) 25 MG tablet Take 25 mg by mouth daily.    [provider]  Omega-3 Fatty Acids (OMEGA-3 FISH OIL) 300 MG CAPS Take by mouth. Patient not taking: Reported on 04/03/2021    [provider]  oxybutynin (DITROPAN-XL) 5 MG 24 hr tablet TK 1 T PO  QD 04/01/15   [provider]  vitamin C (ASCORBIC ACID) 500 MG tablet Take 500 mg by mouth.    [provider]  Vitamin D,  Ergocalciferol, 50000 units CAPS Take by mouth.    [provider]    Family History Family History  Problem Relation Age of Onset   Hypertension Mother    Kidney failure Mother    Heart disease Mother    Hypertension Father    Cancer Father    Hypertension Sister    Hypertension Sister    Healthy Son    Breast cancer Neg Hx     Social History Social History   Tobacco Use   Smoking status: Never   Smokeless tobacco: Never  Vaping Use   Vaping Use: Never used  Substance Use Topics   Alcohol use: No    Alcohol/week: 0.0 standard drinks of alcohol   Drug use: No     Allergies   Simvastatin   Review of Systems Review of Systems  Constitutional:  Negative for fatigue and fever.  Gastrointestinal:  Negative for abdominal pain, nausea and vomiting.  Genitourinary:  Negative for difficulty urinating, dysuria, flank pain, hematuria and urgency.  Musculoskeletal:  Positive for back pain. Negative for arthralgias and gait problem.  Neurological:  Negative for weakness and numbness.     Physical Exam Triage Vital Signs ED Triage Vitals  Enc Vitals Group     BP 11/27/21 1229 (!) 152/76     Pulse Rate 11/27/21 1229 (!) 55     Resp 11/27/21 1229 18     Temp 11/27/21 1229 98.1 F (36.7 C)     Temp Source 11/27/21 1229 Oral     SpO2 11/27/21 1229 100 %     Weight --      Height --      Head Circumference --      Peak Flow --      Pain Score 11/27/21 1226 8     Pain Loc --      Pain Edu? --      Excl. in [place]? --    No data found.  Updated Vital Signs BP (!) 152/76 (BP Location: Left Arm)   Pulse (!) 55   Temp 98.1 F (36.7 C) (Oral)   Resp 18   LMP  (LMP Unknown)   SpO2 100%      Physical Exam Vitals and nursing note reviewed.  Constitutional:      General: She is not in acute distress.    Appearance: Normal appearance. She is not ill-appearing or toxic-appearing.  HENT:     Head: Normocephalic and atraumatic.  Eyes:     General: No scleral  icterus.       Right eye: No discharge.        Left eye: No discharge.     Conjunctiva/sclera: Conjunctivae normal.  Cardiovascular:     Rate and Rhythm: Regular rhythm. Bradycardia present.     Heart sounds: Normal heart sounds.  Pulmonary:     Effort: Pulmonary effort is normal. No respiratory distress.     Breath sounds: Normal breath sounds.  Abdominal:     Palpations: Abdomen is soft.     Tenderness: There is no abdominal tenderness. There is no right CVA tenderness or left CVA tenderness.  Musculoskeletal:     Cervical back: Neck supple.     Lumbar back: Tenderness (TTP right paralumbar muscles) present. No bony tenderness. Decreased range of motion. Negative right straight leg raise test and negative left straight leg raise test.     Comments: Uses cane to ambulate and walks slowly  Skin:    General: Skin is dry.  Neurological:     General: No focal deficit present.     Mental Status: She is alert. Mental status is at baseline.     Motor: No weakness.     Gait: Gait normal.  Psychiatric:        Mood and Affect: Mood normal.        Behavior: Behavior normal.        Thought Content: Thought content normal.      UC Treatments / Results  Labs (all labs ordered are listed, but only abnormal results are displayed) Labs Reviewed - No data to display  EKG   Radiology No results found.  Procedures Procedures (including critical care time)  Medications Ordered in UC Medications - No data to display  Initial Impression / Assessment and Plan / UC Course  I have reviewed the triage vital  signs and the nursing notes.  Pertinent labs & imaging results that were available during my care of the patient were reviewed by me and considered in my medical decision making (see chart for details).   81 year old female with history of chronic lumbar back pain and radiculopathy presents for acute right-sided low back pain without radiation and denies any numbness, weakness or  tingling or red flag signs/symptoms.  Has been taking gabapentin and Tylenol.  Patient is overall well-appearing.  She walks slowly and uses a cane to ambulate.  On exam she has tenderness palpation of the right paralumbar muscles.  Negative straight leg raise.  Slightly reduced range of motion especially with extension of back and full flexion.  5/5 strength of lower extremities.  Suspect lumbar strain/muscle spasms or could be related to her chronic low back pain.  Advised continuing Tylenol and gabapentin.  Sent tramadol as needed for severe pain.  Also encouraged use of heat, ice, muscle rubs and lidocaine patches.  Advised her to make a follow-up appoint with her PCP especially if symptoms or not improving over the next week or if they worsen.  ED precautions given.   Final Clinical Impressions(s) / UC Diagnoses   Final diagnoses:  Acute right-sided low back pain without sciatica  Chronic low back pain, unspecified back pain laterality, unspecified whether sciatica present     Discharge Instructions      -Your back pain is likely due to muscle strain and spasms.  Could also be related to underlying arthritis.  I would advise using heat on the area and taking Tylenol.  I sent tramadol in case needed for severe pain. - You may also ice the area and use topical muscle rubs or topical lidocaine. - Follow-up with your PCP if no improvement in the next week or if symptoms are getting worse.  BACK PAIN RED FLAGS: If the back pain acutely worsens or there are any red flag symptoms such as numbness/tingling, leg weakness, saddle anesthesia, or loss of bowel/bladder control, go immediately to the ER. Follow up with [place] as scheduled or sooner if the pain does not begin to resolve or if it worsens before the follow up       ED Prescriptions     Medication Sig Dispense Auth. Provider   traMADol (ULTRAM) 50 MG tablet Take 1 tablet (50 mg total) by mouth every 8 (eight) hours as needed for up to 5  days. 12 tablet Danton Clap, PA-C      I have reviewed the PDMP during this encounter.   Danton Clap, PA-C 11/27/21 1331

## 2021-11-27 NOTE — ED Triage Notes (Signed)
Onset Friday of pain in right lower back/side.  Denies any falls.  Denies any radiation of pain.  Points to a specific spot in right lower back at waist that is very painful , especially when walking.  States there is no improvement with duration of ambulation, "just stays the same"

## 2021-11-27 NOTE — Discharge Instructions (Signed)
-  Your back pain is likely due to muscle strain and spasms.  Could also be related to underlying arthritis.  I would advise using heat on the area and taking Tylenol.  I sent tramadol in case needed for severe pain. - You may also ice the area and use topical muscle rubs or topical lidocaine. - Follow-up with your PCP if no improvement in the next week or if symptoms are getting worse.  BACK PAIN RED FLAGS: If the back pain acutely worsens or there are any red flag symptoms such as numbness/tingling, leg weakness, saddle anesthesia, or loss of bowel/bladder control, go immediately to the ER. Follow up with [place] as scheduled or sooner if the pain does not begin to resolve or if it worsens before the follow up

## 2021-12-04 ENCOUNTER — Ambulatory Visit
Admission: RE | Admit: 2021-12-04 | Discharge: 2021-12-04 | Disposition: A | Discharge status: Home / Self Care | Payer: Medicare Other | Source: Ambulatory Visit | Attending: Internal Medicine | Admitting: Internal Medicine

## 2021-12-04 DIAGNOSIS — Z1231 Encounter for screening mammogram for malignant neoplasm of breast: Secondary | ICD-10-CM | POA: Diagnosis not present

## 2021-12-06 ENCOUNTER — Other Ambulatory Visit: Payer: Self-pay | Admitting: Internal Medicine

## 2021-12-11 ENCOUNTER — Other Ambulatory Visit: Payer: Self-pay | Admitting: Family

## 2021-12-11 ENCOUNTER — Other Ambulatory Visit: Payer: Self-pay

## 2021-12-11 DIAGNOSIS — N6489 Other specified disorders of breast: Secondary | ICD-10-CM

## 2021-12-11 DIAGNOSIS — R928 Other abnormal and inconclusive findings on diagnostic imaging of breast: Secondary | ICD-10-CM

## 2021-12-19 ENCOUNTER — Ambulatory Visit
Admission: RE | Admit: 2021-12-19 | Discharge: 2021-12-19 | Disposition: A | Discharge status: Home / Self Care | Payer: Medicare Other | Source: Ambulatory Visit | Attending: Family | Admitting: Family

## 2021-12-19 DIAGNOSIS — N6489 Other specified disorders of breast: Secondary | ICD-10-CM | POA: Diagnosis present

## 2021-12-19 DIAGNOSIS — R928 Other abnormal and inconclusive findings on diagnostic imaging of breast: Secondary | ICD-10-CM | POA: Insufficient documentation

## 2022-01-12 ENCOUNTER — Ambulatory Visit
Admission: EM | Admit: 2022-01-12 | Discharge: 2022-01-12 | Disposition: A | Discharge status: Home / Self Care | Payer: Medicare Other | Attending: Physician Assistant | Admitting: Physician Assistant

## 2022-01-12 DIAGNOSIS — M545 Low back pain, unspecified: Secondary | ICD-10-CM | POA: Insufficient documentation

## 2022-01-12 DIAGNOSIS — G8929 Other chronic pain: Secondary | ICD-10-CM

## 2022-01-12 LAB — URINALYSIS, MICROSCOPIC (REFLEX)

## 2022-01-12 LAB — URINALYSIS, ROUTINE W REFLEX MICROSCOPIC
Bilirubin Urine: NEGATIVE
Glucose, UA: NEGATIVE mg/dL
Hgb urine dipstick: NEGATIVE
Ketones, ur: NEGATIVE mg/dL
Leukocytes,Ua: NEGATIVE
Nitrite: NEGATIVE
Specific Gravity, Urine: 1.02 (ref 1.005–1.030)
pH: 6 (ref 5.0–8.0)

## 2022-01-12 MED ORDER — TRAMADOL HCL 50 MG PO TABS: 50.0000 mg | ORAL_TABLET | Freq: Four times a day (QID) | ORAL | 0 refills | Status: AC | PRN

## 2022-01-12 NOTE — Discharge Instructions (Addendum)
-  Your back pain is likely due to muscle strain and spasms.  Could also be related to underlying arthritis.  I would advise using heat on the area and taking Tylenol.  I sent tramadol in case needed for severe pain. - You may also ice the area and use topical muscle rubs or topical lidocaine. - Follow-up with your PCP or orthopedics for appointment since this is recurrent.

## 2022-01-12 NOTE — ED Triage Notes (Signed)
Pt reports pain in her right side lower back since Sunday . She feels like she has to go to the bathroom but can't go.  She is constipated but took ducolax. Spasm when urinating and frequency.

## 2022-01-12 NOTE — ED Provider Notes (Signed)
[facility]    CSN: [id number] Arrival date & time: 01/12/22  3614      History   Chief Complaint No chief complaint on file.   HPI Anna Chambers is a 81 y.o. female presenting for right lower back pain x 5 days.  She denies any injury or change in her physical activity level.  She says pain does occasionally radiate to the right groin region and right thigh as well as some pain of the buttocks/  No numbness, weakness or tingling of the right lower extremity.  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  She reports taking gabapentin for this and also takes Tylenol.  Patient says those medications are not really helping her right lower back pain at this time and she wonders if she can get a shot for the pain.  She says pain is about 9 out of 10 and constant, worse with trying to get up and walk.  No loss of bowel or bladder control.  Also urinary frequency without pain.  Also reports constipation but has a history of constipation.  Took Dulcolax and was able to produce a bowel movement 2 days ago.  Other medical history significant for hypertension, seizures, sleep apnea and patient has a history of cervical spinal surgery.  Of note, patient was seen here about a month and a half ago for the same symptoms, pain of the right lower back on the right side.  Reports pain is in the same area at this time but feels worse.  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   Chronic kidney disease 01/22/2019   Hypertension 01/22/2019   Sleep apnea 01/22/2019   History of poliomyelitis 09/24/2018   Hx of adenomatous colonic polyps 09/24/2018   Vitamin B12 deficiency (non anemic) 09/02/2018   Syncope 02/25/2017   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[place]) 01/26/2016   Lumbosacral radiculopathy 01/26/2016   S/P cervical spinal fusion 01/26/2016   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   Obesity (BMI 30-39.9) 12/07/2014   Thoracic or lumbosacral neuritis or radiculitis 12/03/2011   Cervical spondylosis with myelopathy 12/03/2011   Shortness of breath 05/08/2011   DDD (degenerative disc disease), cervical 01/17/2011   Low back pain 01/09/2011   Pain in joints 10/23/2010    Past Surgical History:  Procedure Laterality Date   CATARACT EXTRACTION     CATARACT EXTRACTION W/PHACO Left 04/12/2021   Procedure: CATARACT EXTRACTION PHACO AND INTRAOCULAR LENS PLACEMENT ([place]) LEFT 8.12 01:13.1;  Surgeon: Leandrew Koyanagi, MD;  Location: [facility];  Service: Ophthalmology;  Laterality: Left;   COLONOSCOPY     COLONOSCOPY WITH PROPOFOL N/A 12/04/2018   Procedure: COLONOSCOPY WITH PROPOFOL;  Surgeon: Toledo, Benay Pike, MD;  Location: [facility];  Service: Gastroenterology;  Laterality: N/A;   EYE SURGERY     FOOT SURGERY Left    NECK SURGERY     SPINE SURGERY     ACDF C3-4  VAGINAL HYSTERECTOMY      OB History   No obstetric history on file.      Home Medications    Prior to Admission medications   Medication Sig Start Date End Date Taking? Authorizing Provider  traMADol (ULTRAM) 50 MG tablet Take 1 tablet (50 mg total) by mouth every 6 (six) hours as needed for up to 3 days. 01/12/22 01/15/22 Yes Laurene Footman B, PA-C  allopurinol (ZYLOPRIM) 100 MG tablet daily as needed. 09/02/18   [provider]  aspirin 81 MG tablet Take 81 mg by mouth daily.    [provider]  b complex  vitamins tablet Take 1 tablet by mouth daily. Patient not taking: Reported on 09/11/2021    [provider]  brimonidine-timolol (COMBIGAN) 0.2-0.5 % ophthalmic solution Administer 10 drops to both eyes Two (2) times a day. Frequency:PHARMDIR   Dosage:12m     Instructions:  Note:Instill one drop in each eye twice daily. Dose: 1 05/03/09   [provider]  brinzolamide (AZOPT) 1 % ophthalmic suspension Administer 1 drop to both eyes Two (2) times a day (at 8am and 5pm). Frequency:PHARMDIR   Dosage:0.0     Instructions:  Note:Instill one drop in each eye twice a day. Dose: 1 05/03/09   [provider]  CHOLECALCIFEROL PO Take 500 Units by mouth 2 (two) times a week. Patient not taking: Reported on 09/11/2021    [provider]  famotidine (PEPCID) 20 MG tablet Take 20 mg by mouth daily.    [provider]  fluticasone (FLONASE) 50 MCG/ACT nasal spray fluticasone propionate 50 mcg/actuation nasal spray,suspension  Fluticasone Propionate 50 MCG/ACT Nasal Suspension QTY: 1 gram Days: 30 Refills: 5  Written: 04/03/19 Patient Instructions: use as directed  2 sprays each nostril qd Patient not taking: Reported on 09/11/2021 04/03/19   [provider]  gabapentin (NEURONTIN) 100 MG capsule Take 100 mg by mouth 3 (three) times daily. Takes 3 times a WPhysicist, medical Historical, MD  hydrochlorothiazide (HYDRODIURIL) 12.5 MG tablet Take 12.5 mg by mouth daily. Take one tablet 3 times a week Patient not taking: Reported on 11/27/2021    [provider]  latanoprost (XALATAN) 0.005 % ophthalmic solution Administer 2.5 mL to both eyes nightly. Frequency:QD   Dosage:0.0     Instructions:  Note:Dose: 0.005 % 11/06/11   [provider]  levETIRAcetam (KEPPRA) 500 MG tablet TAKE 1 TABLET BY MOUTH TWICE A DAY 03/20/21   Patel, Donika K, DO  losartan (COZAAR) 25 MG tablet Take 25 mg by mouth daily.    [provider]  Omega-3 Fatty Acids (OMEGA-3  FISH OIL) 300 MG CAPS Take by mouth. Patient not taking: Reported on 04/03/2021    [provider]  oxybutynin (DITROPAN-XL) 5 MG 24 hr tablet TK 1 T PO  QD 04/01/15   [provider]  vitamin C (ASCORBIC ACID) 500 MG tablet Take 500 mg by mouth.    [provider]  Vitamin D, Ergocalciferol, 50000 units CAPS Take by mouth.    [provider]    Family History Family History  Problem Relation Age of Onset   Hypertension Mother    Kidney failure Mother    Heart disease Mother    Hypertension Father    Cancer Father    Hypertension Sister    Hypertension Sister    Healthy Son    Breast cancer Neg Hx     Social History Social History   Tobacco  Use   Smoking status: Never   Smokeless tobacco: Never  Vaping Use   Vaping Use: Never used  Substance Use Topics   Alcohol use: No    Alcohol/week: 0.0 standard drinks of alcohol   Drug use: No     Allergies   Simvastatin   Review of Systems Review of Systems  Constitutional:  Negative for fatigue and fever.  Gastrointestinal:  Negative for abdominal pain, nausea and vomiting.  Genitourinary:  Negative for difficulty urinating, dysuria, flank pain, hematuria and urgency.  Musculoskeletal:  Positive for back pain. Negative for arthralgias and gait problem.  Neurological:  Negative for weakness and numbness.     Physical Exam Triage Vital Signs ED Triage Vitals  Enc Vitals Group     BP 11/27/21 1229 (!) 152/76     Pulse Rate 11/27/21 1229 (!) 55     Resp 11/27/21 1229 18     Temp 11/27/21 1229 98.1 F (36.7 C)     Temp Source 11/27/21 1229 Oral     SpO2 11/27/21 1229 100 %     Weight --      Height --      Head Circumference --      Peak Flow --      Pain Score 11/27/21 1226 8     Pain Loc --      Pain Edu? --      Excl. in [place]? --    No data found.  Updated Vital Signs BP (!) 174/78 (BP Location: Right Arm)   Pulse 67   Temp 98.1 F (36.7 C) (Oral)   Resp 20   LMP  (LMP  Unknown)   SpO2 100%      Physical Exam Vitals and nursing note reviewed.  Constitutional:      General: She is not in acute distress.    Appearance: Normal appearance. She is not ill-appearing or toxic-appearing.  HENT:     Head: Normocephalic and atraumatic.  Eyes:     General: No scleral icterus.       Right eye: No discharge.        Left eye: No discharge.     Conjunctiva/sclera: Conjunctivae normal.  Cardiovascular:     Rate and Rhythm: Regular rhythm. Bradycardia present.     Heart sounds: Normal heart sounds.  Pulmonary:     Effort: Pulmonary effort is normal. No respiratory distress.     Breath sounds: Normal breath sounds.  Abdominal:     Palpations: Abdomen is soft.     Tenderness: There is no abdominal tenderness. There is no right CVA tenderness or left CVA tenderness.  Musculoskeletal:     Cervical back: Neck supple.     Lumbar back: Tenderness (TTP right paralumbar muscles) present. No bony tenderness. Decreased range of motion. Negative right straight leg raise test and negative left straight leg raise test.     Comments: Uses cane to ambulate and walks slowly  Skin:    General: Skin is dry.  Neurological:     General: No focal deficit present.     Mental Status: She is alert. Mental status is at baseline.     Motor: No weakness.     Gait: Gait normal.  Psychiatric:        Mood and Affect: Mood normal.        Behavior: Behavior normal.        Thought Content: Thought content normal.      UC Treatments / Results  Labs (  all labs ordered are listed, but only abnormal results are displayed) Labs Reviewed  URINALYSIS, ROUTINE W REFLEX MICROSCOPIC - Abnormal; Notable for the following components:      Result Value   APPearance HAZY (*)    Protein, ur TRACE (*)    All other components within normal limits  URINALYSIS, MICROSCOPIC (REFLEX) - Abnormal; Notable for the following components:   Bacteria, UA RARE (*)    All other components within normal  limits    EKG   Radiology No results found.  Procedures Procedures (including critical care time)  Medications Ordered in UC Medications - No data to display  Initial Impression / Assessment and Plan / UC Course  I have reviewed the triage vital signs and the nursing notes.  Pertinent labs & imaging results that were available during my care of the patient were reviewed by me and considered in my medical decision making (see chart for details).   81 year old female with history of chronic lumbar back pain and radiculopathy presents for acute right-sided low back pain with radiation to the right groin and thigh.  Denies any numbness, weakness or tingling or red flag signs/symptoms.  Has been taking gabapentin and Tylenol.  Patient is overall well-appearing.  She walks slowly and uses a cane to ambulate.  On exam she has tenderness palpation of the right paralumbar muscles.  Negative straight leg raise.  Slightly reduced range of motion especially with extension of back and full flexion.  5/5 strength of lower extremities.  Suspect lumbar strain/muscle spasms or could be related to her chronic low back pain.  Advised continuing Tylenol and gabapentin.  Sent tramadol as needed for severe pain.  Also encouraged use of heat, ice, muscle rubs and lidocaine patches.  Advised her to make a follow-up appointment with her PCP or orthopedics especially if symptoms or not improving over the next week or if they worsen.  ED precautions given.   Final Clinical Impressions(s) / UC Diagnoses   Final diagnoses:  Right-sided low back pain without sciatica, unspecified chronicity  Chronic low back pain, unspecified back pain laterality, unspecified whether sciatica present     Discharge Instructions      -Your back pain is likely due to muscle strain and spasms.  Could also be related to underlying arthritis.  I would advise using heat on the area and taking Tylenol.  I sent tramadol in case  needed for severe pain. - You may also ice the area and use topical muscle rubs or topical lidocaine. - Follow-up with your PCP or orthopedics for appointment since this is recurrent.       ED Prescriptions     Medication Sig Dispense Auth. Provider   traMADol (ULTRAM) 50 MG tablet Take 1 tablet (50 mg total) by mouth every 6 (six) hours as needed for up to 3 days. 12 tablet Danton Clap, PA-C      I have reviewed the PDMP during this encounter.     Danton Clap, PA-C 01/12/22 1205

## 2022-02-28 ENCOUNTER — Emergency Department: Payer: Medicare Other

## 2022-02-28 ENCOUNTER — Encounter: Payer: Self-pay | Admitting: *Deleted

## 2022-02-28 ENCOUNTER — Observation Stay
Admission: EM | Admit: 2022-02-28 | Discharge: 2022-03-01 | Disposition: A | Discharge status: Home Health | Payer: Medicare Other | Attending: Osteopathic Medicine | Admitting: Osteopathic Medicine

## 2022-02-28 ENCOUNTER — Other Ambulatory Visit: Payer: Self-pay

## 2022-02-28 DIAGNOSIS — N1831 Chronic kidney disease, stage 3a: Secondary | ICD-10-CM | POA: Insufficient documentation

## 2022-02-28 DIAGNOSIS — R262 Difficulty in walking, not elsewhere classified: Secondary | ICD-10-CM | POA: Diagnosis not present

## 2022-02-28 DIAGNOSIS — E538 Deficiency of other specified B group vitamins: Secondary | ICD-10-CM | POA: Diagnosis present

## 2022-02-28 DIAGNOSIS — R0789 Other chest pain: Secondary | ICD-10-CM | POA: Diagnosis present

## 2022-02-28 DIAGNOSIS — Z7982 Long term (current) use of aspirin: Secondary | ICD-10-CM | POA: Insufficient documentation

## 2022-02-28 DIAGNOSIS — N189 Chronic kidney disease, unspecified: Secondary | ICD-10-CM | POA: Diagnosis present

## 2022-02-28 DIAGNOSIS — I129 Hypertensive chronic kidney disease with stage 1 through stage 4 chronic kidney disease, or unspecified chronic kidney disease: Secondary | ICD-10-CM | POA: Diagnosis not present

## 2022-02-28 DIAGNOSIS — I10 Essential (primary) hypertension: Secondary | ICD-10-CM | POA: Diagnosis present

## 2022-02-28 DIAGNOSIS — R2681 Unsteadiness on feet: Secondary | ICD-10-CM | POA: Diagnosis not present

## 2022-02-28 DIAGNOSIS — M6281 Muscle weakness (generalized): Secondary | ICD-10-CM | POA: Diagnosis not present

## 2022-02-28 DIAGNOSIS — H409 Unspecified glaucoma: Secondary | ICD-10-CM | POA: Diagnosis present

## 2022-02-28 DIAGNOSIS — E785 Hyperlipidemia, unspecified: Secondary | ICD-10-CM | POA: Diagnosis present

## 2022-02-28 DIAGNOSIS — M109 Gout, unspecified: Secondary | ICD-10-CM | POA: Diagnosis present

## 2022-02-28 DIAGNOSIS — R531 Weakness: Secondary | ICD-10-CM

## 2022-02-28 DIAGNOSIS — Z79899 Other long term (current) drug therapy: Secondary | ICD-10-CM | POA: Diagnosis not present

## 2022-02-28 DIAGNOSIS — E876 Hypokalemia: Secondary | ICD-10-CM | POA: Diagnosis not present

## 2022-02-28 DIAGNOSIS — R42 Dizziness and giddiness: Secondary | ICD-10-CM

## 2022-02-28 DIAGNOSIS — F32A Depression, unspecified: Secondary | ICD-10-CM | POA: Diagnosis present

## 2022-02-28 LAB — URINALYSIS, ROUTINE W REFLEX MICROSCOPIC
Bilirubin Urine: NEGATIVE
Glucose, UA: NEGATIVE mg/dL
Hgb urine dipstick: NEGATIVE
Ketones, ur: NEGATIVE mg/dL
Leukocytes,Ua: NEGATIVE
Nitrite: NEGATIVE
Protein, ur: NEGATIVE mg/dL
Specific Gravity, Urine: 1.015 (ref 1.005–1.030)
pH: 7 (ref 5.0–8.0)

## 2022-02-28 LAB — CBC WITH DIFFERENTIAL/PLATELET
Abs Immature Granulocytes: 0.02 10*3/uL (ref 0.00–0.07)
Basophils Absolute: 0 10*3/uL (ref 0.0–0.1)
Basophils Relative: 0 %
Eosinophils Absolute: 0.2 10*3/uL (ref 0.0–0.5)
Eosinophils Relative: 3 %
HCT: 38.3 % (ref 36.0–46.0)
Hemoglobin: 12.3 g/dL (ref 12.0–15.0)
Immature Granulocytes: 0 %
Lymphocytes Relative: 35 %
Lymphs Abs: 2.4 10*3/uL (ref 0.7–4.0)
MCH: 26.8 pg (ref 26.0–34.0)
MCHC: 32.1 g/dL (ref 30.0–36.0)
MCV: 83.4 fL (ref 80.0–100.0)
Monocytes Absolute: 0.6 10*3/uL (ref 0.1–1.0)
Monocytes Relative: 9 %
Neutro Abs: 3.6 10*3/uL (ref 1.7–7.7)
Neutrophils Relative %: 53 %
Platelets: 267 10*3/uL (ref 150–400)
RBC: 4.59 MIL/uL (ref 3.87–5.11)
RDW: 16 % — ABNORMAL HIGH (ref 11.5–15.5)
WBC: 6.8 10*3/uL (ref 4.0–10.5)
nRBC: 0 % (ref 0.0–0.2)

## 2022-02-28 LAB — COMPREHENSIVE METABOLIC PANEL
ALT: 13 U/L (ref 0–44)
AST: 19 U/L (ref 15–41)
Albumin: 4.2 g/dL (ref 3.5–5.0)
Alkaline Phosphatase: 80 U/L (ref 38–126)
Anion gap: 11 (ref 5–15)
BUN: 16 mg/dL (ref 8–23)
CO2: 25 mmol/L (ref 22–32)
Calcium: 9.1 mg/dL (ref 8.9–10.3)
Chloride: 107 mmol/L (ref 98–111)
Creatinine, Ser: 0.96 mg/dL (ref 0.44–1.00)
GFR, Estimated: 59 mL/min — ABNORMAL LOW (ref >60)
Glucose, Bld: 92 mg/dL (ref 70–99)
Potassium: 2.9 mmol/L — ABNORMAL LOW (ref 3.5–5.1)
Sodium: 143 mmol/L (ref 135–145)
Total Bilirubin: 0.9 mg/dL (ref 0.3–1.2)
Total Protein: 7.1 g/dL (ref 6.5–8.1)

## 2022-02-28 LAB — TROPONIN I (HIGH SENSITIVITY)
Troponin I (High Sensitivity): 13 ng/L (ref <18)
Troponin I (High Sensitivity): 14 ng/L (ref <18)

## 2022-02-28 LAB — MAGNESIUM: Magnesium: 2.2 mg/dL (ref 1.7–2.4)

## 2022-02-28 LAB — LIPASE, BLOOD: Lipase: 24 U/L (ref 11–51)

## 2022-02-28 LAB — LACTIC ACID, PLASMA: Lactic Acid, Venous: 1.2 mmol/L (ref 0.5–1.9)

## 2022-02-28 MED ORDER — IOHEXOL 350 MG/ML SOLN
100.0000 mL | Freq: Once | INTRAVENOUS | Status: AC | PRN
  Administered 2022-02-28: 100 mL via INTRAVENOUS

## 2022-02-28 MED ORDER — MECLIZINE HCL 25 MG PO TABS
12.5000 mg | ORAL_TABLET | Freq: Once | ORAL | Status: AC
  Administered 2022-02-28: 12.5 mg via ORAL
  Filled 2022-02-28: qty 1

## 2022-02-28 MED ORDER — LOSARTAN POTASSIUM 50 MG PO TABS
50.0000 mg | ORAL_TABLET | Freq: Once | ORAL | Status: AC
  Administered 2022-02-28: 50 mg via ORAL
  Filled 2022-02-28: qty 1

## 2022-02-28 MED ORDER — POTASSIUM CHLORIDE CRYS ER 20 MEQ PO TBCR
40.0000 meq | EXTENDED_RELEASE_TABLET | Freq: Once | ORAL | Status: AC
  Administered 2022-02-28: 40 meq via ORAL
  Filled 2022-02-28: qty 2

## 2022-02-28 MED ORDER — POTASSIUM CHLORIDE 10 MEQ/100ML IV SOLN: 10.0000 meq | INTRAVENOUS | Status: DC

## 2022-02-28 MED ORDER — POTASSIUM CHLORIDE 10 MEQ/100ML IV SOLN
10.0000 meq | INTRAVENOUS | Status: AC
  Administered 2022-02-28 (×2): 10 meq via INTRAVENOUS
  Filled 2022-02-28: qty 100

## 2022-02-28 NOTE — Assessment & Plan Note (Addendum)
Replete and trend Hold diuresis

## 2022-02-28 NOTE — Assessment & Plan Note (Signed)
Allopurinol as needed

## 2022-02-28 NOTE — ED Provider Notes (Signed)
[facility] Provider Note    Event Date/Time   First MD Initiated Contact with Patient 02/28/22 1535     (approximate)   History   Chest Pain   HPI  Anna Chambers is a 82 y.o. female  here with chest pain, dizziness, weakness. Pt primary complaint is dizziness and difficulty ambulating today. Began this AM and has persisted since onset. She has also had some acute on chonic R flank pain and R lower back pain, which she has been seen recently for and is being worked up for arthritis. Pt has also had progressively worsening mild chest pressure today with weakness. Reports she normally is able to ambulate fairly independently but feels dizzy, like the roomi is moving, when she tries to walk today. No fevers, chills.       Physical Exam   Triage Vital Signs: ED Triage Vitals  Enc Vitals Group     BP 02/28/22 1204 (!) 182/102     Pulse Rate 02/28/22 1204 67     Resp 02/28/22 1204 18     Temp 02/28/22 1204 98.1 F (36.7 C)     Temp Source 02/28/22 1204 Oral     SpO2 02/28/22 1204 98 %     Weight 02/28/22 1205 156 lb (70.8 kg)     Height 02/28/22 1205 '5\' 4"'$  (1.626 m)     Head Circumference --      Peak Flow --      Pain Score 02/28/22 1205 8     Pain Loc --      Pain Edu? --      Excl. in [place]? --     Most recent vital signs: Vitals:   02/28/22 2000 02/28/22 2222  BP: (!) 178/99 (!) 158/78  Pulse: (!) 56 80  Resp: 18 18  Temp:  97.9 F (36.6 C)  SpO2: 97% 95%     General: Awake, no distress.  CV:  Good peripheral perfusion.  Resp:  Normal effort. Normal breath sounds bilaterally. Abd:  No distention. No RUQ or RLQ TTP. No rebound or guarding. Other:  CNII-XII intact with exception of diminished vision and subtle R ptosis, which is chronic per patient. Pt has slight dysmetria, worse on R. No nystagmus. Strength 5/5 bilateral UE and LE. Normal sensation to light touch.   ED Results / Procedures / Treatments   Labs (all labs ordered are  listed, but only abnormal results are displayed) Labs Reviewed  CBC WITH DIFFERENTIAL/PLATELET - Abnormal; Notable for the following components:      Result Value   RDW 16.0 (*)    All other components within normal limits  COMPREHENSIVE METABOLIC PANEL - Abnormal; Notable for the following components:   Potassium 2.9 (*)    GFR, Estimated 59 (*)    All other components within normal limits  URINALYSIS, ROUTINE W REFLEX MICROSCOPIC - Abnormal; Notable for the following components:   Color, Urine COLORLESS (*)    APPearance CLEAR (*)    All other components within normal limits  LIPASE, BLOOD  LACTIC ACID, PLASMA  MAGNESIUM  LACTIC ACID, PLASMA  TROPONIN I (HIGH SENSITIVITY)  TROPONIN I (HIGH SENSITIVITY)     EKG Normal sinus rhythm with occasional PVCs. VR 67, PR 152, QRS 64, QTc 414. No acute st elevations or depressions.   RADIOLOGY MR Brain:NA[facility] CT C/A/P dissection: no acute abnormality   I also independently reviewed and agree with radiologist interpretations.   PROCEDURES:  Critical Care performed: No  MEDICATIONS ORDERED IN ED: Medications  potassium chloride SA (KLOR-CON M) CR tablet 40 mEq (40 mEq Oral Given 02/28/22 1752)  potassium chloride 10 mEq in 100 mL IVPB (0 mEq Intravenous Stopped 02/28/22 2002)  iohexol (OMNIPAQUE) 350 MG/ML injection 100 mL (100 mLs Intravenous Contrast Given 02/28/22 1634)  losartan (COZAAR) tablet 50 mg (50 mg Oral Given 02/28/22 2219)  meclizine (ANTIVERT) tablet 12.5 mg (12.5 mg Oral Given 02/28/22 2220)     IMPRESSION / MDM / ASSESSMENT AND PLAN / ED COURSE  I reviewed the triage vital signs and the nursing notes.                              Differential diagnosis includes, but is not limited to, vertigo (central, peripheral), anemia, deconditioning, ACS, polypharmacy, dissection  Patient's presentation is most consistent with acute presentation with potential threat to life or bodily function.  The patient is on the  cardiac monitor to evaluate for evidence of arrhythmia and/or significant heart rate changes.  82 yo F here with weakness, dizziness, side pain. Suspect side pain is MSk related to her lumbar radiculopathy/arthritis. Her dizziness, however, is fairly persistent. It is positional suggestive of peripheral etiology though with her risk factors, concern for possible central cause. MRI obtained and is negative. Labs overall reassuring - no significant acute anemia, lytes are wnl. Trop negative, doubt ACS. EKG is nonischemic.  Pt remains dizzy and unable to ambulate. She is also significant hypertensive which is new for her. Will admit for tx and observation. K repleted.    FINAL CLINICAL IMPRESSION(S) / ED DIAGNOSES   Final diagnoses:  Hypokalemia  Weakness  Dizziness     Rx / DC Orders   ED Discharge Orders     None        Note:  This document was prepared using Dragon voice recognition software and may include unintentional dictation errors.   Duffy Bruce, MD 02/28/22 2312

## 2022-02-28 NOTE — Hospital Course (Addendum)
Patient is a 82 year old female with history of HTN, CKD stage III, glaucoma, gout, seizure disorder who presents with dizziness, weakness, right-sided hip pain and back pain for the last week.  She reports seeing her primary care physician started her on something for bladder spasms as well as Dulcolax.  Possibly some prednisone but this is unclear.  She reports that suddenly she has been incontinent of urine in the last week as well.  In the ED she was noted to be significantly weak with loss of balance and inability to ambulate and we are asked to admit for this.  Patient is profoundly hypokalemic and wildly hypertensive

## 2022-02-28 NOTE — ED Notes (Signed)
Patient transported to MRI 

## 2022-02-28 NOTE — ED Provider Triage Note (Signed)
Emergency Medicine Provider Triage Evaluation Note  Anna Chambers , a 82 y.o. female  was evaluated in triage.  Pt complains of headache and right sided abdominal pain for the last 2 days. Reports nausea. Reports that she has low back pain too.  BP equal bilaterally  Review of Systems  Positive: Chest pain, back pain, abdominal pain, headache, nausea Negative: Vomiting, diarrhea  Physical Exam  LMP  (LMP Unknown)  Gen:   Awake, no distress   Resp:  Normal effort  MSK:   Moves extremities without difficulty  Other:  Pulses equal  Medical Decision Making  Medically screening exam initiated at 12:02 PM.  Appropriate orders placed.  Elyn Aquas was informed that the remainder of the evaluation will be completed by another provider, this initial triage assessment does not replace that evaluation, and the importance of remaining in the ED until their evaluation is complete.     Marquette Old, PA-C 02/28/22 1208

## 2022-02-28 NOTE — Assessment & Plan Note (Signed)
Continue eyedrops

## 2022-02-28 NOTE — ED Triage Notes (Signed)
Pt c/o CP, h/a, and pain in her back and abd. Pt also reports dizziness and nausea.

## 2022-02-28 NOTE — H&P (Signed)
History and Physical    Patient: Anna Chambers ASN:[id number] DOB: 1940-10-27 DOA: 02/28/2022 DOS: the patient was seen and examined on 02/28/2022 PCP: Perrin Maltese, MD  Patient coming from: Home  Chief Complaint:  Chief Complaint  Patient presents with   Chest Pain   HPI: Anna Chambers is a 82 y.o. female with medical history significant of  HTN, CKD stage III, glaucoma, gout, seizure disorder who presents with dizziness, weakness, right-sided hip pain and back pain for the last week.  She reports seeing her primary care physician started her on something for bladder spasms as well as Dulcolax.  Possibly some prednisone but this is unclear.  She reports that suddenly she has been incontinent of urine in the last week as well.  In the ED she was noted to be significantly weak with loss of balance and inability to ambulate and we are asked to admit for this.  Patient is profoundly hypokalemic.  And wildly hypertensive Review of Systems: As mentioned in the history of present illness. All other systems reviewed and are negative. Past Medical History:  Diagnosis Date   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HCC)    Chronic kidney disease 01/22/2019   Depression    Hypertension    Seizure ([place]) 02/25/2017   Sleep apnea    Syncope and collapse 02/25/2017   Past Surgical History:  Procedure Laterality Date   CATARACT EXTRACTION     CATARACT EXTRACTION W/PHACO Left 04/12/2021   Procedure: CATARACT EXTRACTION PHACO AND INTRAOCULAR LENS PLACEMENT ([place]) LEFT 8.12 01:13.1;  Surgeon: Leandrew Koyanagi, MD;  Location: [facility];  Service: Ophthalmology;  Laterality: Left;   COLONOSCOPY     COLONOSCOPY WITH PROPOFOL N/A 12/04/2018   Procedure: COLONOSCOPY WITH PROPOFOL;  Surgeon: Toledo, Benay Pike, MD;  Location: [facility];  Service: Gastroenterology;  Laterality: N/A;   EYE SURGERY     FOOT SURGERY Left    NECK SURGERY     SPINE SURGERY     ACDF C3-4   VAGINAL  HYSTERECTOMY     Social History:  reports that she has never smoked. She has never used smokeless tobacco. She reports that she does not drink alcohol and does not use drugs.  Allergies  Allergen Reactions   Simvastatin Rash    Family History  Problem Relation Age of Onset   Hypertension Mother    Kidney failure Mother    Heart disease Mother    Hypertension Father    Cancer Father    Hypertension Sister    Hypertension Sister    Healthy Son    Breast cancer Neg Hx     Prior to Admission medications   Medication Sig Start Date End Date Taking? Authorizing Provider  allopurinol (ZYLOPRIM) 100 MG tablet daily as needed. 09/02/18  Yes [provider]  aspirin 81 MG tablet Take 81 mg by mouth daily as needed for pain or fever.   Yes [provider]  brimonidine-timolol (COMBIGAN) 0.2-0.5 % ophthalmic solution Administer 10 drops to both eyes Two (2) times a day. Frequency:PHARMDIR   Dosage:74m     Instructions:  Note:Instill one drop in each eye twice daily. Dose: 1 05/03/09  Yes [provider]  brinzolamide (AZOPT) 1 % ophthalmic suspension Administer 1 drop to both eyes Two (2) times a day (at 8am and 5pm). Frequency:PHARMDIR   Dosage:0.0     Instructions:  Note:Instill one drop in each eye twice a day. Dose: 1 05/03/09  Yes [provider]  gabapentin (NEURONTIN) 100 MG capsule Take 100 mg by mouth 3 (three) times daily. Takes 3 times a WEEK   Yes [provider]  latanoprost (XALATAN) 0.005 % ophthalmic solution Administer 2.5 mL to both eyes nightly. Frequency:QD   Dosage:0.0     Instructions:  Note:Dose: 0.005 % 11/06/11  Yes [provider]  levETIRAcetam (KEPPRA) 500 MG tablet TAKE 1 TABLET BY MOUTH TWICE A DAY 03/20/21  Yes Patel, Donika K, DO  losartan (COZAAR) 25 MG tablet Take 25 mg by mouth daily.   Yes [provider]  oxybutynin (DITROPAN-XL) 5 MG 24 hr tablet Take 5 mg by mouth daily. 04/01/15  Yes [provider]  Vitamin D, Ergocalciferol, 50000 units CAPS Take by mouth.   Yes [provider]  b complex vitamins tablet Take 1 tablet by mouth daily. Patient not taking: Reported on 09/11/2021    [provider]  CHOLECALCIFEROL PO Take 500 Units by mouth 2 (two) times a week. Patient not taking: Reported on 09/11/2021    [provider]  famotidine (PEPCID) 20 MG tablet Take 20 mg by mouth daily. Patient not taking: Reported on 02/28/2022    [provider]  fluticasone (FLONASE) 50 MCG/ACT nasal spray fluticasone propionate 50 mcg/actuation nasal spray,suspension  Fluticasone Propionate 50 MCG/ACT Nasal Suspension QTY: 1 gram Days: 30 Refills: 5  Written: 04/03/19 Patient Instructions: use as directed  2 sprays each nostril qd Patient not taking: Reported on 09/11/2021 04/03/19   [provider]  hydrochlorothiazide (HYDRODIURIL) 12.5 MG tablet Take 12.5 mg by mouth daily. Take one tablet 3 times a week Patient not taking: Reported on 11/27/2021    [provider]  Omega-3 Fatty Acids (OMEGA-3 FISH OIL) 300 MG CAPS Take by mouth. Patient not taking: Reported on 04/03/2021    [provider]  vitamin C (ASCORBIC ACID) 500 MG tablet Take 500 mg by mouth. Patient not taking: Reported on 02/28/2022    [provider]    Physical Exam: Vitals:   02/28/22 1205 02/28/22 1700 02/28/22 2000 02/28/22 2222  BP:  (!) 179/93 (!) 178/99 (!) 158/78  Pulse:  65 (!) 56 80  Resp:  '18 18 18  '$ Temp:    97.9 F (36.6 C)  TempSrc:    Oral  SpO2:  100% 97% 95%  Weight: 70.8 kg     Height: '5\' 4"'$  (1.626 m)      Physical Examination: General appearance - alert, well appearing, and in no distress Chest - clear to auscultation, no wheezes, rales or rhonchi, symmetric air entry Heart - normal rate, regular rhythm, normal S1, S2, no murmurs, rubs, clicks or gallops Abdomen - soft, nontender, nondistended, no masses or organomegaly   Data  Reviewed: Results for orders placed or performed during the hospital encounter of 02/28/22 (from the past 24 hour(s))  Troponin I (High Sensitivity)     Status: None   Collection Time: 02/28/22 12:07 PM  Result Value Ref Range   Troponin I (High Sensitivity) 13 <18 ng/L  CBC with Differential     Status: Abnormal   Collection Time: 02/28/22 12:07 PM  Result Value Ref Range   WBC 6.8 4.0 - 10.5 K/uL   RBC 4.59 3.87 - 5.11 MIL/uL   Hemoglobin 12.3 12.0 - 15.0 g/dL   HCT 38.3 36.0 - 46.0 %   MCV 83.4 80.0 - 100.0 fL   MCH 26.8 26.0 - 34.0 pg   MCHC 32.1 30.0 - 36.0 g/dL  RDW 16.0 (H) 11.5 - 15.5 %   Platelets 267 150 - 400 K/uL   nRBC 0.0 0.0 - 0.2 %   Neutrophils Relative % 53 %   Neutro Abs 3.6 1.7 - 7.7 K/uL   Lymphocytes Relative 35 %   Lymphs Abs 2.4 0.7 - 4.0 K/uL   Monocytes Relative 9 %   Monocytes Absolute 0.6 0.1 - 1.0 K/uL   Eosinophils Relative 3 %   Eosinophils Absolute 0.2 0.0 - 0.5 K/uL   Basophils Relative 0 %   Basophils Absolute 0.0 0.0 - 0.1 K/uL   Immature Granulocytes 0 %   Abs Immature Granulocytes 0.02 0.00 - 0.07 K/uL  Comprehensive metabolic panel     Status: Abnormal   Collection Time: 02/28/22 12:07 PM  Result Value Ref Range   Sodium 143 135 - 145 mmol/L   Potassium 2.9 (L) 3.5 - 5.1 mmol/L   Chloride 107 98 - 111 mmol/L   CO2 25 22 - 32 mmol/L   Glucose, Bld 92 70 - 99 mg/dL   BUN 16 8 - 23 mg/dL   Creatinine, Ser 0.96 0.44 - 1.00 mg/dL   Calcium 9.1 8.9 - 10.3 mg/dL   Total Protein 7.1 6.5 - 8.1 g/dL   Albumin 4.2 3.5 - 5.0 g/dL   AST 19 15 - 41 U/L   ALT 13 0 - 44 U/L   Alkaline Phosphatase 80 38 - 126 U/L   Total Bilirubin 0.9 0.3 - 1.2 mg/dL   GFR, Estimated 59 (L) >60 mL/min   Anion gap 11 5 - 15  Lipase, blood     Status: None   Collection Time: 02/28/22 12:07 PM  Result Value Ref Range   Lipase 24 11 - 51 U/L  Troponin I (High Sensitivity)     Status: None   Collection Time: 02/28/22  3:45 PM  Result Value Ref Range   Troponin  I (High Sensitivity) 14 <18 ng/L  Lactic acid, plasma     Status: None   Collection Time: 02/28/22  4:12 PM  Result Value Ref Range   Lactic Acid, Venous 1.2 0.5 - 1.9 mmol/L  Magnesium     Status: None   Collection Time: 02/28/22  4:12 PM  Result Value Ref Range   Magnesium 2.2 1.7 - 2.4 mg/dL  Urinalysis, Routine w reflex microscopic -Urine, Clean Catch     Status: Abnormal   Collection Time: 02/28/22  6:11 PM  Result Value Ref Range   Color, Urine COLORLESS (A) YELLOW   APPearance CLEAR (A) CLEAR   Specific Gravity, Urine 1.015 1.005 - 1.030   pH 7.0 5.0 - 8.0   Glucose, UA NEGATIVE NEGATIVE mg/dL   Hgb urine dipstick NEGATIVE NEGATIVE   Bilirubin Urine NEGATIVE NEGATIVE   Ketones, ur NEGATIVE NEGATIVE mg/dL   Protein, ur NEGATIVE NEGATIVE mg/dL   Nitrite NEGATIVE NEGATIVE   Leukocytes,Ua NEGATIVE NEGATIVE   MR BRAIN WO CONTRAST  Result Date: 02/28/2022 CLINICAL DATA:  Altered mental status EXAM: MRI HEAD WITHOUT CONTRAST TECHNIQUE: Multiplanar, multiecho pulse sequences of the brain and surrounding structures were obtained without intravenous contrast. COMPARISON:  02/25/17 MRI Head FINDINGS: Brain: No acute infarction, hemorrhage, hydrocephalus, extra-axial collection or mass lesion. Sequela of mild chronic microvascular ischemic change. Vascular: Normal flow voids. Skull and upper cervical spine: Normal marrow signal. Sinuses/Orbits: Bilateral lens replacement. Other: None IMPRESSION: No acute intracranial abnormality. Electronically Signed   By: Marin Roberts M.D.   On: 02/28/2022 20:52   CT Angio Chest/Abd/Pel  for Dissection W and/or Wo Contrast  Result Date: 02/28/2022 CLINICAL DATA:  Right-sided abdominal pain, back pain EXAM: CT ANGIOGRAPHY CHEST, ABDOMEN AND PELVIS TECHNIQUE: Non-contrast CT of the chest was initially obtained. Multidetector CT imaging through the chest, abdomen and pelvis was performed using the standard protocol during bolus administration of intravenous  contrast. Multiplanar reconstructed images and MIPs were obtained and reviewed to evaluate the vascular anatomy. RADIATION DOSE REDUCTION: This exam was performed according to the departmental dose-optimization program which includes automated exposure control, adjustment of the mA and/or kV according to patient size and/or use of iterative reconstruction technique. CONTRAST:  189m OMNIPAQUE IOHEXOL 350 MG/ML SOLN COMPARISON:  Chest x-ray 02/28/2022 FINDINGS: CTA CHEST FINDINGS Cardiovascular: Non contrasted images of the chest demonstrate no acute intramural hematoma. Nonaneurysmal aorta. No dissection is seen. Mild atherosclerosis. Mild cardiomegaly. No pericardial effusion Mediastinum/Nodes: No enlarged mediastinal, hilar, or axillary lymph nodes. Thyroid gland, trachea, and esophagus demonstrate no significant findings. Lungs/Pleura: Lungs are clear. No pleural effusion or pneumothorax. Musculoskeletal: Sternum is intact. Scoliosis of the spine with degenerative changes. No acute osseous abnormality. Review of the MIP images confirms the above findings. CTA ABDOMEN AND PELVIS FINDINGS VASCULAR Aorta: Normal caliber aorta without aneurysm, dissection, vasculitis or significant stenosis. Mild atherosclerosis. Celiac: Patent without evidence of aneurysm, dissection, vasculitis or significant stenosis. SMA: Patent without evidence of aneurysm, dissection, vasculitis or significant stenosis. Renals: Both renal arteries are patent without evidence of aneurysm, dissection, vasculitis, fibromuscular dysplasia or significant stenosis. IMA: Patent without evidence of aneurysm, dissection, vasculitis or significant stenosis. Inflow: Patent without evidence of aneurysm, dissection, vasculitis or significant stenosis. Review of the MIP images confirms the above findings. NON-VASCULAR Hepatobiliary: No focal liver abnormality is seen. No gallstones, gallbladder wall thickening, or biliary dilatation. Pancreas: Unremarkable.  No pancreatic ductal dilatation or surrounding inflammatory changes. Spleen: Normal in size without focal abnormality. Adrenals/Urinary Tract: Adrenal glands are unremarkable. Kidneys are normal, without renal calculi, focal lesion, or hydronephrosis. Bladder is markedly distended. Stomach/Bowel: Stomach is within normal limits. Appendix appears normal. No evidence of bowel wall thickening, distention, or inflammatory changes. Diverticular disease of the left colon without acute inflammatory process. Lymphatic: No suspicious lymph nodes Reproductive: Status post hysterectomy. No adnexal masses. Other: Negative for pelvic effusion or free air. Musculoskeletal: Scoliosis and degenerative changes. No acute osseous abnormality. Review of the MIP images confirms the above findings. IMPRESSION: 1. Negative for acute aortic dissection or aneurysm. Mild aortic atherosclerosis without significant stenosis or occlusive disease. 2. No CT evidence for acute intrathoracic, intra-abdominal, or intrapelvic abnormality. 3. Diverticular disease of the left colon without acute inflammatory process. 4. Marked distension of urinary bladder Electronically Signed   By: KDonavan FoilM.D.   On: 02/28/2022 16:57   DG Chest 2 View  Result Date: 02/28/2022 CLINICAL DATA:  Chest pain.  Headache. EXAM: CHEST - 2 VIEW COMPARISON:  02/19/2020 FINDINGS: Mild cardiomegaly with left ventricular prominence. Aortic atherosclerosis and tortuosity. Probable hiatal hernia. Mild patchy density in the left lower lobe suggesting mild bronchopneumonia. No effusion. No heart failure. IMPRESSION: 1. Mild patchy density in the left lower lobe suggesting mild bronchopneumonia. 2. Aortic atherosclerosis and tortuosity. Probable hiatal hernia. Electronically Signed   By: MNelson ChimesM.D.   On: 02/28/2022 12:42     Assessment and Plan: * Hypokalemia Replete and trend Hold diuresis  Gout Allopurinol as needed  Vitamin B12 deficiency (non  anemic) Continue B12 repletion  Hypertension Continue losartan, with increase to 50 mg given the extent of her  hypertension Lopressor as needed Holding HydroDIURIL  Hyperlipemia Low-cholesterol diet  Glaucoma (increased eye pressure) Continue eyedrops  Chronic kidney disease Creatinine appears at baseline Avoid nephrotoxic agents      Advance Care Planning:   Code Status: Full Code   Consults: None  Family Communication: Family at bedside  Severity of Illness: The appropriate patient status for this patient is OBSERVATION. Observation status is judged to be reasonable and necessary in order to provide the required intensity of service to ensure the patient's safety. The patient's presenting symptoms, physical exam findings, and initial radiographic and laboratory data in the context of their medical condition is felt to place them at decreased risk for further clinical deterioration. Furthermore, it is anticipated that the patient will be medically stable for discharge from the hospital within 2 midnights of admission.   Author: Donnamae Jude, MD 02/28/2022 11:28 PM  For on call review [email or web address].

## 2022-02-28 NOTE — Assessment & Plan Note (Signed)
Low cholesterol diet

## 2022-02-28 NOTE — ED Notes (Signed)
Pt to MRI with MRI tech

## 2022-02-28 NOTE — Assessment & Plan Note (Addendum)
Continue losartan, with increase to 50 mg given the extent of her hypertension Lopressor as needed Holding HydroDIURIL

## 2022-02-28 NOTE — Assessment & Plan Note (Signed)
Creatinine appears at baseline Avoid nephrotoxic agents

## 2022-02-28 NOTE — Assessment & Plan Note (Signed)
Continue B12 repletion

## 2022-03-01 DIAGNOSIS — E876 Hypokalemia: Secondary | ICD-10-CM | POA: Diagnosis not present

## 2022-03-01 LAB — POTASSIUM: Potassium: 3.9 mmol/L (ref 3.5–5.1)

## 2022-03-01 LAB — BASIC METABOLIC PANEL
Anion gap: 10 (ref 5–15)
BUN: 12 mg/dL (ref 8–23)
CO2: 26 mmol/L (ref 22–32)
Calcium: 9.2 mg/dL (ref 8.9–10.3)
Chloride: 107 mmol/L (ref 98–111)
Creatinine, Ser: 0.99 mg/dL (ref 0.44–1.00)
GFR, Estimated: 57 mL/min — ABNORMAL LOW (ref >60)
Glucose, Bld: 129 mg/dL — ABNORMAL HIGH (ref 70–99)
Potassium: 3.4 mmol/L — ABNORMAL LOW (ref 3.5–5.1)
Sodium: 143 mmol/L (ref 135–145)

## 2022-03-01 LAB — CBC
HCT: 38.8 % (ref 36.0–46.0)
Hemoglobin: 12.2 g/dL (ref 12.0–15.0)
MCH: 26.1 pg (ref 26.0–34.0)
MCHC: 31.4 g/dL (ref 30.0–36.0)
MCV: 83.1 fL (ref 80.0–100.0)
Platelets: 282 10*3/uL (ref 150–400)
RBC: 4.67 MIL/uL (ref 3.87–5.11)
RDW: 15.9 % — ABNORMAL HIGH (ref 11.5–15.5)
WBC: 8.3 10*3/uL (ref 4.0–10.5)
nRBC: 0 % (ref 0.0–0.2)

## 2022-03-01 LAB — LACTIC ACID, PLASMA: Lactic Acid, Venous: 1.8 mmol/L (ref 0.5–1.9)

## 2022-03-01 LAB — RPR: RPR Ser Ql: NONREACTIVE

## 2022-03-01 LAB — HIV ANTIBODY (ROUTINE TESTING W REFLEX): HIV Screen 4th Generation wRfx: NONREACTIVE

## 2022-03-01 MED ORDER — LOSARTAN POTASSIUM 50 MG PO TABS
50.0000 mg | ORAL_TABLET | Freq: Every day | ORAL | Status: DC
  Administered 2022-03-01: 50 mg via ORAL
  Filled 2022-03-01: qty 1

## 2022-03-01 MED ORDER — BRIMONIDINE TARTRATE-TIMOLOL 0.2-0.5 % OP SOLN
1.0000 [drp] | Freq: Two times a day (BID) | OPHTHALMIC | Status: DC
  Filled 2022-03-01: qty 5

## 2022-03-01 MED ORDER — GABAPENTIN 100 MG PO CAPS: 100.0000 mg | ORAL_CAPSULE | Freq: Three times a day (TID) | ORAL | Status: DC

## 2022-03-01 MED ORDER — BRINZOLAMIDE 1 % OP SUSP
1.0000 [drp] | Freq: Three times a day (TID) | OPHTHALMIC | Status: DC
  Administered 2022-03-01: 1 [drp] via OPHTHALMIC
  Filled 2022-03-01: qty 10

## 2022-03-01 MED ORDER — POTASSIUM CHLORIDE CRYS ER 20 MEQ PO TBCR
40.0000 meq | EXTENDED_RELEASE_TABLET | Freq: Two times a day (BID) | ORAL | Status: DC
  Administered 2022-03-01 (×2): 40 meq via ORAL
  Filled 2022-03-01 (×2): qty 2

## 2022-03-01 MED ORDER — POLYETHYLENE GLYCOL 3350 17 G PO PACK: 17.0000 g | PACK | Freq: Every day | ORAL | Status: DC | PRN

## 2022-03-01 MED ORDER — LACTATED RINGERS IV SOLN: INTRAVENOUS | Status: DC

## 2022-03-01 MED ORDER — BRIMONIDINE TARTRATE 0.2 % OP SOLN
1.0000 [drp] | Freq: Two times a day (BID) | OPHTHALMIC | Status: DC
  Administered 2022-03-01: 1 [drp] via OPHTHALMIC
  Filled 2022-03-01: qty 5

## 2022-03-01 MED ORDER — ONDANSETRON HCL 4 MG/2ML IJ SOLN: 4.0000 mg | Freq: Four times a day (QID) | INTRAMUSCULAR | Status: DC | PRN

## 2022-03-01 MED ORDER — LEVETIRACETAM 500 MG PO TABS
500.0000 mg | ORAL_TABLET | Freq: Two times a day (BID) | ORAL | Status: DC
  Administered 2022-03-01: 500 mg via ORAL
  Filled 2022-03-01: qty 1

## 2022-03-01 MED ORDER — ALLOPURINOL 100 MG PO TABS: 100.0000 mg | ORAL_TABLET | Freq: Every day | ORAL | Status: DC | PRN

## 2022-03-01 MED ORDER — METOPROLOL TARTRATE 5 MG/5ML IV SOLN: 5.0000 mg | Freq: Four times a day (QID) | INTRAVENOUS | Status: DC | PRN

## 2022-03-01 MED ORDER — GABAPENTIN 100 MG PO CAPS: 200.0000 mg | ORAL_CAPSULE | Freq: Every day | ORAL | Status: DC

## 2022-03-01 MED ORDER — LATANOPROST 0.005 % OP SOLN
1.0000 [drp] | Freq: Every day | OPHTHALMIC | Status: DC
  Filled 2022-03-01: qty 2.5

## 2022-03-01 MED ORDER — LOSARTAN POTASSIUM 50 MG PO TABS: 50.0000 mg | ORAL_TABLET | Freq: Every day | ORAL | 0 refills | Status: DC

## 2022-03-01 MED ORDER — PNEUMOCOCCAL 20-VAL CONJ VACC 0.5 ML IM SUSY: 0.5000 mL | PREFILLED_SYRINGE | INTRAMUSCULAR | Status: DC

## 2022-03-01 MED ORDER — TIMOLOL MALEATE 0.5 % OP SOLN
1.0000 [drp] | Freq: Two times a day (BID) | OPHTHALMIC | Status: DC
  Administered 2022-03-01: 1 [drp] via OPHTHALMIC
  Filled 2022-03-01: qty 5

## 2022-03-01 MED ORDER — ONDANSETRON HCL 4 MG PO TABS: 4.0000 mg | ORAL_TABLET | Freq: Four times a day (QID) | ORAL | Status: DC | PRN

## 2022-03-01 MED ORDER — ALLOPURINOL 100 MG PO TABS
100.0000 mg | ORAL_TABLET | Freq: Every day | ORAL | Status: DC
  Administered 2022-03-01: 100 mg via ORAL
  Filled 2022-03-01: qty 1

## 2022-03-01 NOTE — TOC Initial Note (Signed)
Transition of Care [facility]) - Initial/Assessment Note    Patient Details  Name: Anna Chambers MRN: [id number] Date of Birth: June 02, 1940  [facility] of [facility]) CM/SW Contact:    Beverly Sessions, RN Phone Number: 03/01/2022, 3:38 PM  Clinical Narrative:                  Admitted [id number]  Admitted from:home with husband and son PCP: [facility] Current home health/prior home health/DME: [facility]  Therapy recommending home health. Patient in agreement.  States she does not have a preference of home health agency.  Referral made to [facility] with [facility].   Patient states family to transport at discharge        Patient Goals and CMS Choice            Expected Discharge Plan and Services         Expected Discharge Date: 03/01/22                                    Prior Living Arrangements/Services                       Activities of Daily Living Home Assistive Devices/Equipment: Chana Bode (specify type), Cane (specify quad or straight) ADL Screening (condition at time of admission) Patient's cognitive ability adequate to safely complete daily activities?: Yes Is the patient deaf or have difficulty hearing?: No Does the patient have difficulty seeing, even when wearing glasses/contacts?: No Does the patient have difficulty concentrating, remembering, or making decisions?: No Patient able to express need for assistance with ADLs?: Yes Does the patient have difficulty dressing or bathing?: No Independently performs ADLs?: Yes (appropriate for developmental age) Does the patient have difficulty walking or climbing stairs?: Yes Weakness of Legs: Both Weakness of Arms/Hands: None  Permission Sought/Granted                  Emotional Assessment              Admission diagnosis:  Hypokalemia [E87.6] Dizziness [R42] Weakness [R53.1] Patient Active Problem List   Diagnosis Date Noted   Hypokalemia 02/28/2022   Gout 02/28/2022    Depression 04/17/2019   Displacement of lumbar intervertebral disc without myelopathy 04/17/2019   Glaucoma (increased eye pressure) 04/17/2019   Hyperlipemia 04/17/2019   Lumbar disc disorder with myelopathy 04/17/2019   Osteoporosis, 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   Chronic kidney disease 01/22/2019   Hypertension 01/22/2019   Sleep apnea 01/22/2019   History of poliomyelitis 09/24/2018   Hx of adenomatous colonic polyps 09/24/2018   Vitamin B12 deficiency (non anemic) 09/02/2018   Syncope 02/25/2017   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[place]) 01/26/2016   Lumbosacral radiculopathy 01/26/2016   S/P cervical spinal fusion 01/26/2016   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   Obesity (BMI 30-39.9) 12/07/2014   Thoracic or lumbosacral neuritis or radiculitis 12/03/2011   Cervical spondylosis with myelopathy 12/03/2011   Shortness of breath 05/08/2011   DDD (degenerative disc disease), cervical 01/17/2011   Low back pain 01/09/2011   Pain in joints 10/23/2010   PCP:  Perrin Maltese, MD Pharmacy:   [id number] [address]: [phone]: [phone] [facility] [place] [address]Phone:  [phone] Fax: [phone]  [id number] [place] [place]Phone: [phone]: [phone]    Social Determinants of Health (SDOH) Social History: SDOH Screenings   Food Insecurity: No Food Insecurity (03/01/2022)  Housing: Low Risk  (03/01/2022)  Transportation Needs: No Transportation Needs (03/01/2022)  Utilities: At Risk (03/01/2022)  Tobacco Use: Low Risk  (02/28/2022)   SDOH Interventions: Food Insecurity Interventions: Intervention Not Indicated Housing Interventions: Intervention Not Indicated Utilities Interventions: Inpatient TOC, Other  (Comment) (resources added to AVS)   Readmission Risk Interventions     No data to display

## 2022-03-01 NOTE — Discharge Summary (Signed)
Physician Discharge Summary   Patient: Anna Chambers MRN: [id number]  DOB: 1940-10-31   Admit:     Date of Admission: 02/28/2022 Admitted from: home   Discharge: Date of discharge: 03/01/22 Disposition: Home Condition at discharge: good  CODE STATUS: FULL CODE      Discharge Physician: Emeterio Reeve, DO [facility]     PCP: Perrin Maltese, MD  Recommendations for Outpatient Follow-up:  Follow up with PCP Perrin Maltese, MD in 1-2 weeks Please obtain labs/tests: BMP, Magnesium in 1-2 weeks Please follow up on the following pending results: none PCP AND OTHER OUTPATIENT PROVIDERS: SEE BELOW FOR SPECIFIC DISCHARGE INSTRUCTIONS PRINTED FOR PATIENT IN ADDITION TO GENERIC AVS PATIENT INFO    Discharge Instructions     Diet - low sodium heart healthy   Complete by: As directed    Discharge instructions   Complete by: As directed    STOP hydrochlorothiazide (blood pressure medication)  We have increased your Losartan to keep blood pressure in a good range - new prescription sent for 50 mg daily of this medication.   Please follow up with your primary care office in 1-2 weeks to recheck blood pressure and potassium levels!   Increase activity slowly   Complete by: As directed          Discharge Diagnoses: Principal Problem:   Hypokalemia Active Problems:   Chronic kidney disease   Depression   Glaucoma (increased eye pressure)   Hyperlipemia   Hypertension   Vitamin B12 deficiency (non anemic)   Gout       Hospital Course: Anna Chambers is a 82 y.o. female with medical history significant of  HTN, CKD stage III, glaucoma, gout, seizure disorder who presents with dizziness, weakness, right-sided hip pain and back pain for the last week.  She reports seeing her primary care physician started her on something for bladder spasms as well as Dulcolax.  Possibly some prednisone but this is unclear.  She reports that suddenly she has been incontinent of  urine in the last week as well.   In the ED she was noted to be significantly weak with loss of balance and inability to ambulate and we are asked to admit for this.  Patient is profoundly hypokalemic and hypertensive.  BP corrected and K improved / stabilized. Stable for discharge w/ outpatient follow up   Consultants:  none  Procedures: none      ASSESSMENT & PLAN:   Hypokalemia - improving (2.9 on admission --> 3.4) Holding any diuresis - d/c home HCTZ Will need outpatient follow up   Chronic kidney disease Stage 3a - stable  Creatinine appears at baseline  Glaucoma (increased eye pressure) Continue eyedrops  Hyperlipemia Low-cholesterol diet  Hypertension / Hypertensive urgency - improved (BP 182/102 on presentation --> 136/73) Continue losartan, with increase to 50 mg given the extent of her hypertension Lopressor as needed Holding HydroDIURIL  Vitamin B12 deficiency (non anemic) Continue B12 repletion  Gout Allopurinol home meds           Discharge Instructions  Allergies as of 03/01/2022       Reactions   Simvastatin Rash        Medication List     TAKE these medications    allopurinol 100 MG tablet Commonly known as: ZYLOPRIM Take 100 mg by mouth daily.   aspirin 81 MG tablet Take 81 mg by mouth 2 (two) times a week.   baclofen 10 MG tablet  Commonly known as: LIORESAL Take 10 mg by mouth at bedtime.   brimonidine-timolol 0.2-0.5 % ophthalmic solution Commonly known as: COMBIGAN Administer 10 drops to both eyes Two (2) times a day. Frequency:PHARMDIR   Dosage:37m     Instructions:  Note:Instill one drop in each eye twice daily. Dose: 1   brinzolamide 1 % ophthalmic suspension Commonly known as: AZOPT Administer 1 drop to both eyes Two (2) times a day (at 8am and 5pm). Frequency:PHARMDIR   Dosage:0.0     Instructions:  Note:Instill one drop in each eye twice a day. Dose: 1   gabapentin 100 MG capsule Commonly known as:  NEURONTIN Take 200 mg by mouth at bedtime. Takes 3 times a WEEK   latanoprost 0.005 % ophthalmic solution Commonly known as: XALATAN Administer 2.5 mL to both eyes nightly. Frequency:QD   Dosage:0.0     Instructions:  Note:Dose: 0.005 %   levETIRAcetam 500 MG tablet Commonly known as: KEPPRA TAKE 1 TABLET BY MOUTH TWICE A DAY   losartan 50 MG tablet Commonly known as: COZAAR Take 1 tablet (50 mg total) by mouth daily. Start taking on: March 02, 2022 What changed:  medication strength how much to take   oxybutynin 5 MG 24 hr tablet Commonly known as: DITROPAN-XL Take 5 mg by mouth daily.   Vitamin D (Ergocalciferol) 50000 units Caps Take by mouth.          Allergies  Allergen Reactions   Simvastatin Rash     Subjective: pt feeling better today, no other concerns.    Discharge Exam: BP 136/73 (BP Location: Left Arm)   Pulse 61   Temp 98.3 F (36.8 C) (Oral)   Resp 18   Ht '5\' 4"'$  (1.626 m)   Wt 69.9 kg   LMP  (LMP Unknown)   SpO2 99%   BMI 26.43 kg/m  General: Pt is alert, awake, not in acute distress Cardiovascular: RRR, S1/S2 +, no rubs, no gallops Respiratory: CTA bilaterally, no wheezing, no rhonchi Abdominal: Soft, NT, ND, bowel sounds + Extremities: no edema, no cyanosis     The results of significant diagnostics from this hospitalization (including imaging, microbiology, ancillary and laboratory) are listed below for reference.     Microbiology: No results found for this or any previous visit (from the past 240 hour(s)).   Labs: BNP (last 3 results) No results for input(s): "BNP" in the last 8760 hours. Basic Metabolic Panel: Recent Labs  Lab 02/28/22 1207 02/28/22 1612 03/01/22 0138 03/01/22 1221  NA 143  --  143  --   K 2.9*  --  3.4* 3.9  CL 107  --  107  --   CO2 25  --  26  --   GLUCOSE 92  --  129*  --   BUN 16  --  12  --   CREATININE 0.96  --  0.99  --   CALCIUM 9.1  --  9.2  --   MG  --  2.2  --   --    Liver  Function Tests: Recent Labs  Lab 02/28/22 1207  AST 19  ALT 13  ALKPHOS 80  BILITOT 0.9  PROT 7.1  ALBUMIN 4.2   Recent Labs  Lab 02/28/22 1207  LIPASE 24   No results for input(s): "AMMONIA" in the last 168 hours. CBC: Recent Labs  Lab 02/28/22 1207 03/01/22 0138  WBC 6.8 8.3  NEUTROABS 3.6  --   HGB 12.3 12.2  HCT 38.3 38.8  MCV 83.4 83.1  PLT 267 282   Cardiac Enzymes: No results for input(s): "CKTOTAL", "CKMB", "CKMBINDEX", "TROPONINI" in the last 168 hours. BNP: Invalid input(s): "POCBNP" CBG: No results for input(s): "GLUCAP" in the last 168 hours. D-Dimer No results for input(s): "DDIMER" in the last 72 hours. Hgb A1c No results for input(s): "HGBA1C" in the last 72 hours. Lipid Profile No results for input(s): "CHOL", "HDL", "LDLCALC", "TRIG", "CHOLHDL", "LDLDIRECT" in the last 72 hours. Thyroid function studies No results for input(s): "TSH", "T4TOTAL", "T3FREE", "THYROIDAB" in the last 72 hours.  Invalid input(s): "FREET3" Anemia work up No results for input(s): "VITAMINB12", "FOLATE", "FERRITIN", "TIBC", "IRON", "RETICCTPCT" in the last 72 hours. Urinalysis    Component Value Date/Time   COLORURINE COLORLESS (A) 02/28/2022 1811   APPEARANCEUR CLEAR (A) 02/28/2022 1811   APPEARANCEUR Hazy 03/01/2012 1059   LABSPEC 1.015 02/28/2022 1811   LABSPEC 1.018 03/01/2012 1059   PHURINE 7.0 02/28/2022 1811   GLUCOSEU NEGATIVE 02/28/2022 1811   GLUCOSEU Negative 03/01/2012 1059   HGBUR NEGATIVE 02/28/2022 1811   BILIRUBINUR NEGATIVE 02/28/2022 1811   BILIRUBINUR Negative 03/01/2012 1059   KETONESUR NEGATIVE 02/28/2022 1811   PROTEINUR NEGATIVE 02/28/2022 1811   NITRITE NEGATIVE 02/28/2022 1811   LEUKOCYTESUR NEGATIVE 02/28/2022 1811   LEUKOCYTESUR 2+ 03/01/2012 1059   Sepsis Labs Recent Labs  Lab 02/28/22 1207 03/01/22 0138  WBC 6.8 8.3   Microbiology No results found for this or any previous visit (from the past 240 hour(s)). Imaging MR  BRAIN WO CONTRAST  Result Date: 02/28/2022 CLINICAL DATA:  Altered mental status EXAM: MRI HEAD WITHOUT CONTRAST TECHNIQUE: Multiplanar, multiecho pulse sequences of the brain and surrounding structures were obtained without intravenous contrast. COMPARISON:  02/25/17 MRI Head FINDINGS: Brain: No acute infarction, hemorrhage, hydrocephalus, extra-axial collection or mass lesion. Sequela of mild chronic microvascular ischemic change. Vascular: Normal flow voids. Skull and upper cervical spine: Normal marrow signal. Sinuses/Orbits: Bilateral lens replacement. Other: None IMPRESSION: No acute intracranial abnormality. Electronically Signed   By: Marin Roberts M.D.   On: 02/28/2022 20:52   CT Angio Chest/Abd/Pel for Dissection W and/or Wo Contrast  Result Date: 02/28/2022 CLINICAL DATA:  Right-sided abdominal pain, back pain EXAM: CT ANGIOGRAPHY CHEST, ABDOMEN AND PELVIS TECHNIQUE: Non-contrast CT of the chest was initially obtained. Multidetector CT imaging through the chest, abdomen and pelvis was performed using the standard protocol during bolus administration of intravenous contrast. Multiplanar reconstructed images and MIPs were obtained and reviewed to evaluate the vascular anatomy. RADIATION DOSE REDUCTION: This exam was performed according to the departmental dose-optimization program which includes automated exposure control, adjustment of the mA and/or kV according to patient size and/or use of iterative reconstruction technique. CONTRAST:  152m OMNIPAQUE IOHEXOL 350 MG/ML SOLN COMPARISON:  Chest x-ray 02/28/2022 FINDINGS: CTA CHEST FINDINGS Cardiovascular: Non contrasted images of the chest demonstrate no acute intramural hematoma. Nonaneurysmal aorta. No dissection is seen. Mild atherosclerosis. Mild cardiomegaly. No pericardial effusion Mediastinum/Nodes: No enlarged mediastinal, hilar, or axillary lymph nodes. Thyroid gland, trachea, and esophagus demonstrate no significant findings. Lungs/Pleura:  Lungs are clear. No pleural effusion or pneumothorax. Musculoskeletal: Sternum is intact. Scoliosis of the spine with degenerative changes. No acute osseous abnormality. Review of the MIP images confirms the above findings. CTA ABDOMEN AND PELVIS FINDINGS VASCULAR Aorta: Normal caliber aorta without aneurysm, dissection, vasculitis or significant stenosis. Mild atherosclerosis. Celiac: Patent without evidence of aneurysm, dissection, vasculitis or significant stenosis. SMA: Patent without evidence of aneurysm, dissection, vasculitis or significant stenosis. Renals: Both renal arteries  are patent without evidence of aneurysm, dissection, vasculitis, fibromuscular dysplasia or significant stenosis. IMA: Patent without evidence of aneurysm, dissection, vasculitis or significant stenosis. Inflow: Patent without evidence of aneurysm, dissection, vasculitis or significant stenosis. Review of the MIP images confirms the above findings. NON-VASCULAR Hepatobiliary: No focal liver abnormality is seen. No gallstones, gallbladder wall thickening, or biliary dilatation. Pancreas: Unremarkable. No pancreatic ductal dilatation or surrounding inflammatory changes. Spleen: Normal in size without focal abnormality. Adrenals/Urinary Tract: Adrenal glands are unremarkable. Kidneys are normal, without renal calculi, focal lesion, or hydronephrosis. Bladder is markedly distended. Stomach/Bowel: Stomach is within normal limits. Appendix appears normal. No evidence of bowel wall thickening, distention, or inflammatory changes. Diverticular disease of the left colon without acute inflammatory process. Lymphatic: No suspicious lymph nodes Reproductive: Status post hysterectomy. No adnexal masses. Other: Negative for pelvic effusion or free air. Musculoskeletal: Scoliosis and degenerative changes. No acute osseous abnormality. Review of the MIP images confirms the above findings. IMPRESSION: 1. Negative for acute aortic dissection or  aneurysm. Mild aortic atherosclerosis without significant stenosis or occlusive disease. 2. No CT evidence for acute intrathoracic, intra-abdominal, or intrapelvic abnormality. 3. Diverticular disease of the left colon without acute inflammatory process. 4. Marked distension of urinary bladder Electronically Signed   By: Donavan Foil M.D.   On: 02/28/2022 16:57   DG Chest 2 View  Result Date: 02/28/2022 CLINICAL DATA:  Chest pain.  Headache. EXAM: CHEST - 2 VIEW COMPARISON:  02/19/2020 FINDINGS: Mild cardiomegaly with left ventricular prominence. Aortic atherosclerosis and tortuosity. Probable hiatal hernia. Mild patchy density in the left lower lobe suggesting mild bronchopneumonia. No effusion. No heart failure. IMPRESSION: 1. Mild patchy density in the left lower lobe suggesting mild bronchopneumonia. 2. Aortic atherosclerosis and tortuosity. Probable hiatal hernia. Electronically Signed   By: Nelson Chimes M.D.   On: 02/28/2022 12:42      Time coordinating discharge: over 30 minutes  SIGNED:  Emeterio Reeve DO [facility]

## 2022-03-01 NOTE — Hospital Course (Addendum)
Anna Chambers is a 82 y.o. female with medical history significant of  HTN, CKD stage III, glaucoma, gout, seizure disorder who presents with dizziness, weakness, right-sided hip pain and back pain for the last week.  She reports seeing her primary care physician started her on something for bladder spasms as well as Dulcolax.  Possibly some prednisone but this is unclear.  She reports that suddenly she has been incontinent of urine in the last week as well.   In the ED she was noted to be significantly weak with loss of balance and inability to ambulate and we are asked to admit for this.  Patient is profoundly hypokalemic and hypertensive.  BP corrected and K improved / stabilized. Stable for discharge w/ outpatient follow up   Consultants:  none  Procedures: none      ASSESSMENT & PLAN:   Principal Problem:   Hypokalemia Active Problems:   Chronic kidney disease   Depression   Glaucoma (increased eye pressure)   Hyperlipemia   Hypertension   Vitamin B12 deficiency (non anemic)   Gout   Hypokalemia - improving (2.9 on admission --> 3.4) Holding any diuresis - d/c home HCTZ Will need outpatient follow up   Chronic kidney disease Stage 3a - stable  Creatinine appears at baseline  Glaucoma (increased eye pressure) Continue eyedrops  Hyperlipemia Low-cholesterol diet  Hypertension / Hypertensive urgency - improved (BP 182/102 on presentation --> 136/73) Continue losartan, with increase to 50 mg given the extent of her hypertension Lopressor as needed Holding HydroDIURIL  Vitamin B12 deficiency (non anemic) Continue B12 repletion  Gout Allopurinol home meds    DVT prophylaxis: *** Pertinent IV fluids/nutrition: *** Central lines / invasive devices: ***  Code Status: ***  Current Admission Status: ***  TOC needs / Dispo plan: *** Barriers to discharge / significant pending items: ***

## 2022-03-01 NOTE — Discharge Instructions (Signed)
Agency Name: [facility] Address: [address], [place], [place] [address] Phone: [phone] Website: [email or web address] Service(s) Offered: Housing services, self-sufficiency, congregate meal  program, and individual development account program.  Agency Name: [facility] Address: [address], [place], [place] [address] Phone: [phone] Email: [email or web address]@[email or web address] Website: [email or web address].[email or web address] Service(s) Offered: Housing the homeless, feeding the hungry, community  kitchen & food pantry, job and education related services.  Agency Name: [facility] Address: [address], [place], [place] [address] Phone: [phone] Email: [email or web address] Service(s) Offered: Counseling, problem pregnancy, advocacy for Hispanics,  limited emergency financial assistance.  Agency Name: [facility] [employer] [facility] Address: [address], [place], [place] [address] Phone: [phone] Website: [email or web address] Service(s) Offered: Child support services; child welfare services; SNAPS;  Medicaid; work first family assistance; and aid with fuel,  rent, food and medicine.  Agency Name: [facility] Address: [address], [place], [place] [address] Phone: [phone] or [phone] Email: [email or web address] Service(s) Offered: Family services and transient assistance; emergency food,  fuel, clothing, limited furniture, utilities; budget counseling,  general counseling; give a kid a coat; thrift store; Christmas  food and toys. Utility assistance, food pantry, rental  assistance, life sustaining medicine  

## 2022-03-01 NOTE — Evaluation (Signed)
Physical Therapy Evaluation Patient Details Name: MICALAH CABEZAS MRN: [id number] DOB: 1940/02/19 Today's Date: 03/01/2022  History of Present Illness  FYNN ADEL is a 82 y.o. female with medical history significant of  HTN, CKD stage III, glaucoma, gout, seizure disorder who presents with dizziness, weakness, right-sided hip pain and back pain for the last week, difficulty ambulating. Brain MRI negative.   Clinical Impression  Patient received in bed, she has family at bedside. Patient reports she just got up and walked to bathroom with nursing. She is agreeable to PT assessment. Patient is mod I with bed mobility. She transfers with supervision. Ambulated 35 feet with min guard, SPC and IV pole as needed. She will continue to benefit from skilled PT to improve functional independence, strength and safety.         Recommendations for follow up therapy are one component of a multi-disciplinary discharge planning process, led by the attending physician.  Recommendations may be updated based on patient status, additional functional criteria and insurance authorization.  Follow Up Recommendations Home health PT      Assistance Recommended at Discharge Intermittent Supervision/Assistance  Patient can return home with the following  A little help with walking and/or transfers;A little help with bathing/dressing/bathroom;Assist for transportation;Help with stairs or ramp for entrance    Equipment Recommendations None recommended by PT  Recommendations for Other Services       Functional Status Assessment Patient has had a recent decline in their functional status and demonstrates the ability to make significant improvements in function in a reasonable and predictable amount of time.     Precautions / Restrictions Precautions Precautions: Fall Restrictions Weight Bearing Restrictions: No      Mobility  Bed Mobility Overal bed mobility: Modified Independent                   Transfers Overall transfer level: Needs assistance   Transfers: Sit to/from Stand Sit to Stand: Supervision                Ambulation/Gait Ambulation/Gait assistance: Min guard Gait Distance (Feet): 35 Feet Assistive device: Straight cane, IV Pole Gait Pattern/deviations: Step-through pattern, Decreased step length - right, Decreased step length - left, Decreased stride length Gait velocity: decr     General Gait Details: patient ambulated with SPC, slight unsteadiness, holding to IV pole as needed for safety.  Stairs            Wheelchair Mobility    Modified Rankin (Stroke Patients Only)       Balance Overall balance assessment: Needs assistance Sitting-balance support: Feet supported Sitting balance-Leahy Scale: Good     Standing balance support: Bilateral upper extremity supported, During functional activity, Reliant on assistive device for balance Standing balance-Leahy Scale: Fair Standing balance comment: benefits from B UE support at this time for safety                             Pertinent Vitals/Pain Pain Assessment Pain Assessment: Faces Faces Pain Scale: Hurts a little bit Pain Location: R hip Pain Descriptors / Indicators: Discomfort, Sore Pain Intervention(s): Monitored during session    Home Living Family/patient expects to be discharged to:: Private residence Living Arrangements: Spouse/significant other;Children Available Help at Discharge: Family;Available 24 hours/day Type of Home: House Home Access: Stairs to enter   [employer] of Steps: 1   Home Layout: One level Home Equipment: Conservation officer, nature (2 wheels);Cane - single point  Prior Function Prior Level of Function : Independent/Modified Independent             Mobility Comments: report she uses cane or walker as needed. Does not drive. ADLs Comments: independent     Hand Dominance        Extremity/Trunk Assessment   Upper  Extremity Assessment Upper Extremity Assessment: Overall WFL for tasks assessed    Lower Extremity Assessment Lower Extremity Assessment: Generalized weakness    Cervical / Trunk Assessment Cervical / Trunk Assessment: Normal  Communication   Communication: No difficulties  Cognition Arousal/Alertness: Awake/alert Behavior During Therapy: WFL for tasks assessed/performed Overall Cognitive Status: Within Functional Limits for tasks assessed                                          General Comments      Exercises     Assessment/Plan    PT Assessment Patient needs continued PT services  PT Problem List Decreased strength;Decreased activity tolerance;Decreased balance;Decreased mobility       PT Treatment Interventions Gait training;Stair training;Functional mobility training;Therapeutic activities;Patient/family education;Balance training;Therapeutic exercise    PT Goals (Current goals can be found in the Care Plan section)  Acute Rehab PT Goals Patient Stated Goal: to go home PT Goal Formulation: With patient/family Time For Goal Achievement: 03/07/22 Potential to Achieve Goals: Good    Frequency Min 2X/week     Co-evaluation               AM-PAC PT "6 Clicks" Mobility  Outcome Measure Help needed turning from your back to your side while in a flat bed without using bedrails?: None Help needed moving from lying on your back to sitting on the side of a flat bed without using bedrails?: None Help needed moving to and from a bed to a chair (including a wheelchair)?: None Help needed standing up from a chair using your arms (e.g., wheelchair or bedside chair)?: None Help needed to walk in hospital room?: A Little Help needed climbing 3-5 steps with a railing? : A Little 6 Click Score: 22    End of Session Equipment Utilized During Treatment: Gait belt Activity Tolerance: Patient tolerated treatment well Patient left: in bed;with call  bell/phone within reach;with bed alarm set;with family/visitor present Nurse Communication: Mobility status PT Visit Diagnosis: Unsteadiness on feet (R26.81);Muscle weakness (generalized) (M62.81);Difficulty in walking, not elsewhere classified (R26.2)    Time: 6948-5462 PT Time Calculation (min) (ACUTE ONLY): 10 min   Charges:   PT Evaluation $PT Eval Moderate Complexity: 1 Mod          Ledarrius Beauchaine, PT, GCS 03/01/22,2:58 PM

## 2022-03-06 ENCOUNTER — Other Ambulatory Visit: Payer: Self-pay | Admitting: Internal Medicine

## 2022-03-07 LAB — CMP14+EGFR
ALT: 9 IU/L (ref 0–32)
AST: 15 IU/L (ref 0–40)
Albumin/Globulin Ratio: 1.9 (ref 1.2–2.2)
Albumin: 4.4 g/dL (ref 3.7–4.7)
Alkaline Phosphatase: 93 IU/L (ref 44–121)
BUN/Creatinine Ratio: 15 (ref 12–28)
BUN: 20 mg/dL (ref 8–27)
Bilirubin Total: 0.7 mg/dL (ref 0.0–1.2)
CO2: 22 mmol/L (ref 20–29)
Calcium: 9.4 mg/dL (ref 8.7–10.3)
Chloride: 107 mmol/L — ABNORMAL HIGH (ref 96–106)
Creatinine, Ser: 1.32 mg/dL — ABNORMAL HIGH (ref 0.57–1.00)
Globulin, Total: 2.3 g/dL (ref 1.5–4.5)
Glucose: 83 mg/dL (ref 70–99)
Potassium: 4.1 mmol/L (ref 3.5–5.2)
Sodium: 144 mmol/L (ref 134–144)
Total Protein: 6.7 g/dL (ref 6.0–8.5)
eGFR: 41 mL/min/{1.73_m2} — ABNORMAL LOW (ref >59)

## 2022-03-13 ENCOUNTER — Ambulatory Visit: Payer: Medicare Other | Admitting: Internal Medicine

## 2022-04-02 ENCOUNTER — Other Ambulatory Visit (HOSPITAL_BASED_OUTPATIENT_CLINIC_OR_DEPARTMENT_OTHER): Payer: Self-pay | Admitting: Osteopathic Medicine

## 2022-04-05 ENCOUNTER — Ambulatory Visit (INDEPENDENT_AMBULATORY_CARE_PROVIDER_SITE_OTHER): Payer: Medicare Other | Admitting: Internal Medicine

## 2022-04-05 ENCOUNTER — Ambulatory Visit (INDEPENDENT_AMBULATORY_CARE_PROVIDER_SITE_OTHER): Payer: Medicare Other

## 2022-04-05 ENCOUNTER — Encounter: Payer: Self-pay | Admitting: Internal Medicine

## 2022-04-05 VITALS — BP 138/72 | HR 71 | Ht 64.0 in | Wt 154.0 lb

## 2022-04-05 DIAGNOSIS — E782 Mixed hyperlipidemia: Secondary | ICD-10-CM | POA: Diagnosis not present

## 2022-04-05 DIAGNOSIS — G8929 Other chronic pain: Secondary | ICD-10-CM | POA: Insufficient documentation

## 2022-04-05 DIAGNOSIS — M25512 Pain in left shoulder: Secondary | ICD-10-CM

## 2022-04-05 DIAGNOSIS — G4733 Obstructive sleep apnea (adult) (pediatric): Secondary | ICD-10-CM

## 2022-04-05 DIAGNOSIS — M7502 Adhesive capsulitis of left shoulder: Secondary | ICD-10-CM

## 2022-04-05 DIAGNOSIS — I10 Essential (primary) hypertension: Secondary | ICD-10-CM | POA: Diagnosis not present

## 2022-04-05 DIAGNOSIS — M1A09X0 Idiopathic chronic gout, multiple sites, without tophus (tophi): Secondary | ICD-10-CM

## 2022-04-05 MED ORDER — METHYLPREDNISOLONE 4 MG PO TBPK: ORAL_TABLET | ORAL | 0 refills | Status: DC

## 2022-04-05 NOTE — Progress Notes (Signed)
Established Patient Office Visit  Subjective:  Patient ID: Anna Chambers, female    DOB: [birth year]  Age: 82 y.o. MRN: [id number]  Chief Complaint  Patient presents with   Follow-up    3 month follow up    Patient comes in for her follow-up today.  Her blood pressure has stabilized and is looking much better than before.  Her labs done last visit were also showing normal potassium and improved creatinine. Today patient complains of her left shoulder pain and stiffness with markedly reduced range of motion.  Reports that she fell a few days ago but there was no acute sharp pain, no bruising, and no swelling.  But since it was hurting she tried not to use it a whole lot and now she has a frozen shoulder. Will check an x-ray and then start further treatment. X-ray results show osteoarthritis, no acute fractures. Will send prescription for Medrol Dosepak.  Patient also advised to put some Voltaren gel over the left shoulder and start range of motion exercises at home.  She is already getting physical therapy for her back and hip pain.     Past Medical History:  Diagnosis Date   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HCC)    Chronic kidney disease 01/22/2019   Depression    Hypertension    Seizure ([place]) 02/25/2017   Sleep apnea    Syncope and collapse 02/25/2017   Past Surgical History:  Procedure Laterality Date   CATARACT EXTRACTION     CATARACT EXTRACTION W/PHACO Left 04/12/2021   Procedure: CATARACT EXTRACTION PHACO AND INTRAOCULAR LENS PLACEMENT ([place]) LEFT 8.12 01:13.1;  Surgeon: Leandrew Koyanagi, MD;  Location: [facility];  Service: Ophthalmology;  Laterality: Left;   COLONOSCOPY     COLONOSCOPY WITH PROPOFOL N/A 12/04/2018   Procedure: COLONOSCOPY WITH PROPOFOL;  Surgeon: Toledo, Benay Pike, MD;  Location: [facility];  Service: Gastroenterology;  Laterality: N/A;   EYE SURGERY     FOOT SURGERY Left    NECK SURGERY     SPINE SURGERY     ACDF C3-4    VAGINAL HYSTERECTOMY       Social History   Socioeconomic History   Marital status: Married    Spouse name: Not on file   Number of children: 1   Years of education: Not on file   Highest education level: Not on file  Occupational History   Occupation: retired  Tobacco Use   Smoking status: Never   Smokeless tobacco: Never  Vaping Use   Vaping Use: Never used  Substance and Sexual Activity   Alcohol use: No    Alcohol/week: 0.0 standard drinks of alcohol   Drug use: No   Sexual activity: Not on file  Other Topics Concern   Not on file  Social History Narrative   Lives with husband in a one story home.  Has one child.     Retired from  [employer] and [employer].     Right handed   Social Determinants of Health   Financial Resource Strain: Not on file  Food Insecurity: No Food Insecurity (03/01/2022)   Hunger Vital Sign    Worried About Running Out of Food in the Last Year: Never true    Ran Out of Food in the Last Year: Never true  Transportation Needs: No Transportation Needs (03/01/2022)   PRAPARE - Hydrologist (Medical): No    Lack of Transportation (Non-Medical): No  Physical Activity: Not on file  Stress: Not on file  Social Connections: Not on file  Intimate Partner Violence: Not At Risk (03/01/2022)   Humiliation, Afraid, Rape, and Kick questionnaire    Fear of Current or Ex-Partner: No    Emotionally Abused: No    Physically Abused: No    Sexually Abused: No    Family History  Problem Relation Age of Onset   Hypertension Mother    Kidney failure Mother    Heart disease Mother    Hypertension Father    Cancer Father    Hypertension Sister    Hypertension Sister    Healthy Son    Breast cancer Neg Hx     Allergies  Allergen Reactions   Simvastatin Rash    Review of Systems  Constitutional:  Negative for chills, diaphoresis, fever, malaise/fatigue and weight loss.  HENT: Negative.    Eyes:  Positive for blurred vision.  Negative for pain and discharge.  Respiratory: Negative.    Cardiovascular: Negative.   Gastrointestinal: Negative.   Genitourinary: Negative.   Musculoskeletal:  Positive for back pain, joint pain (Left shoulder.) and myalgias.  Skin:  Negative for rash.  Neurological: Negative.   Psychiatric/Behavioral: Negative.         Objective:   BP 138/72   Pulse 71   Ht '5\' 4"'$  (1.626 m)   Wt 154 lb (69.9 kg)   LMP  (LMP Unknown)   SpO2 98%   BMI 26.43 kg/m   Vitals:   04/05/22 0956  BP: 138/72  Pulse: 71  Height: '5\' 4"'$  (1.626 m)  Weight: 154 lb (69.9 kg)  SpO2: 98%  BMI (Calculated): 26.42    Physical Exam Vitals and nursing note reviewed.  Constitutional:      Appearance: Normal appearance.  Cardiovascular:     Rate and Rhythm: Regular rhythm. Tachycardia present.  Pulmonary:     Effort: Pulmonary effort is normal.     Breath sounds: Normal breath sounds.  Musculoskeletal:        General: Normal range of motion.     Cervical back: Normal range of motion and neck supple.  Skin:    General: Skin is warm and dry.  Neurological:     General: No focal deficit present.     Mental Status: She is alert and oriented to person, place, and time.      No results found for any visits on 04/05/22.      Assessment & Plan:  X-ray of her left shoulder shows osteoarthritis.  Suspect frozen shoulder.  Will send in a prescription for Medrol Dosepak and to start range of motion exercises. Problem List Items Addressed This Visit     Hyperlipemia   Hypertension   Sleep apnea   Gout   Acute pain of left shoulder - Primary   Relevant Orders   DG Shoulder Left    Return in about 3 weeks (around 04/26/2022).   Total time spent: 30 minutes  Perrin Maltese, MD  04/05/2022

## 2022-04-09 ENCOUNTER — Other Ambulatory Visit: Payer: Self-pay

## 2022-04-09 DIAGNOSIS — I10 Essential (primary) hypertension: Secondary | ICD-10-CM

## 2022-04-09 MED ORDER — LOSARTAN POTASSIUM 50 MG PO TABS: 50.0000 mg | ORAL_TABLET | Freq: Every day | ORAL | 0 refills | Status: DC

## 2022-04-26 ENCOUNTER — Ambulatory Visit: Payer: Medicare Other | Admitting: Cardiovascular Disease

## 2022-04-26 ENCOUNTER — Ambulatory Visit: Payer: Medicare Other | Admitting: Internal Medicine

## 2022-05-02 ENCOUNTER — Other Ambulatory Visit: Payer: Self-pay | Admitting: Internal Medicine

## 2022-05-02 DIAGNOSIS — I10 Essential (primary) hypertension: Secondary | ICD-10-CM

## 2022-05-21 ENCOUNTER — Ambulatory Visit: Payer: Medicare Other | Admitting: Internal Medicine

## 2022-05-21 ENCOUNTER — Other Ambulatory Visit: Payer: Self-pay | Admitting: Internal Medicine

## 2022-05-21 ENCOUNTER — Other Ambulatory Visit: Payer: Medicare Other

## 2022-05-21 DIAGNOSIS — I10 Essential (primary) hypertension: Secondary | ICD-10-CM

## 2022-05-21 DIAGNOSIS — M1A09X0 Idiopathic chronic gout, multiple sites, without tophus (tophi): Secondary | ICD-10-CM

## 2022-05-21 DIAGNOSIS — R7302 Impaired glucose tolerance (oral): Secondary | ICD-10-CM

## 2022-05-21 DIAGNOSIS — E782 Mixed hyperlipidemia: Secondary | ICD-10-CM

## 2022-05-21 DIAGNOSIS — N1831 Chronic kidney disease, stage 3a: Secondary | ICD-10-CM

## 2022-05-21 DIAGNOSIS — M1A39X0 Chronic gout due to renal impairment, multiple sites, without tophus (tophi): Secondary | ICD-10-CM

## 2022-05-22 LAB — CMP14+EGFR
ALT: 15 IU/L (ref 0–32)
AST: 15 IU/L (ref 0–40)
Albumin/Globulin Ratio: 1.6 (ref 1.2–2.2)
Albumin: 4.3 g/dL (ref 3.7–4.7)
Alkaline Phosphatase: 106 IU/L (ref 44–121)
BUN/Creatinine Ratio: 11 — ABNORMAL LOW (ref 12–28)
BUN: 11 mg/dL (ref 8–27)
Bilirubin Total: 0.6 mg/dL (ref 0.0–1.2)
CO2: 25 mmol/L (ref 20–29)
Calcium: 9.9 mg/dL (ref 8.7–10.3)
Chloride: 107 mmol/L — ABNORMAL HIGH (ref 96–106)
Creatinine, Ser: 0.99 mg/dL (ref 0.57–1.00)
Globulin, Total: 2.7 g/dL (ref 1.5–4.5)
Glucose: 93 mg/dL (ref 70–99)
Potassium: 4.1 mmol/L (ref 3.5–5.2)
Sodium: 145 mmol/L — ABNORMAL HIGH (ref 134–144)
Total Protein: 7 g/dL (ref 6.0–8.5)
eGFR: 57 mL/min/{1.73_m2} — ABNORMAL LOW (ref >59)

## 2022-05-22 LAB — CBC WITH DIFFERENTIAL
Basophils Absolute: 0 10*3/uL (ref 0.0–0.2)
Basos: 0 %
EOS (ABSOLUTE): 0.2 10*3/uL (ref 0.0–0.4)
Eos: 3 %
Hematocrit: 38 % (ref 34.0–46.6)
Hemoglobin: 12.2 g/dL (ref 11.1–15.9)
Immature Grans (Abs): 0 10*3/uL (ref 0.0–0.1)
Immature Granulocytes: 0 %
Lymphocytes Absolute: 2.3 10*3/uL (ref 0.7–3.1)
Lymphs: 36 %
MCH: 27.5 pg (ref 26.6–33.0)
MCHC: 32.1 g/dL (ref 31.5–35.7)
MCV: 86 fL (ref 79–97)
Monocytes Absolute: 0.6 10*3/uL (ref 0.1–0.9)
Monocytes: 9 %
Neutrophils Absolute: 3.4 10*3/uL (ref 1.4–7.0)
Neutrophils: 52 %
RBC: 4.44 x10E6/uL (ref 3.77–5.28)
RDW: 14.7 % (ref 11.7–15.4)
WBC: 6.5 10*3/uL (ref 3.4–10.8)

## 2022-05-22 LAB — LIPID PANEL W/O CHOL/HDL RATIO
Cholesterol, Total: 204 mg/dL — ABNORMAL HIGH (ref 100–199)
HDL: 71 mg/dL (ref >39)
LDL Chol Calc (NIH): 118 mg/dL — ABNORMAL HIGH (ref 0–99)
Triglycerides: 83 mg/dL (ref 0–149)
VLDL Cholesterol Cal: 15 mg/dL (ref 5–40)

## 2022-05-22 LAB — HEMOGLOBIN A1C
Est. average glucose Bld gHb Est-mCnc: 114 mg/dL
Hgb A1c MFr Bld: 5.6 % (ref 4.8–5.6)

## 2022-05-22 LAB — URIC ACID: Uric Acid: 4.3 mg/dL (ref 3.1–7.9)

## 2022-05-25 ENCOUNTER — Ambulatory Visit (INDEPENDENT_AMBULATORY_CARE_PROVIDER_SITE_OTHER): Payer: Medicare Other | Admitting: Internal Medicine

## 2022-05-25 ENCOUNTER — Encounter: Payer: Self-pay | Admitting: Internal Medicine

## 2022-05-25 VITALS — BP 132/70 | HR 63 | Ht 64.0 in | Wt 150.8 lb

## 2022-05-25 DIAGNOSIS — I10 Essential (primary) hypertension: Secondary | ICD-10-CM | POA: Diagnosis not present

## 2022-05-25 DIAGNOSIS — E782 Mixed hyperlipidemia: Secondary | ICD-10-CM

## 2022-05-25 DIAGNOSIS — M25512 Pain in left shoulder: Secondary | ICD-10-CM | POA: Diagnosis not present

## 2022-05-25 DIAGNOSIS — R7302 Impaired glucose tolerance (oral): Secondary | ICD-10-CM | POA: Diagnosis not present

## 2022-05-25 DIAGNOSIS — M7502 Adhesive capsulitis of left shoulder: Secondary | ICD-10-CM

## 2022-05-25 DIAGNOSIS — G8929 Other chronic pain: Secondary | ICD-10-CM

## 2022-05-25 NOTE — Progress Notes (Signed)
Established Patient Office Visit  Subjective:  Patient ID: Anna Chambers, female    DOB: February 03, 1941  Age: 82 y.o. MRN: [id number]  Chief Complaint  Patient presents with   Follow-up    3 week follow up    Patient comes in for follow-up of her left shoulder pain today.  She was prescribed a Medrol Dosepak and some home exercises.  She says that she got a little better but not completely and still has difficulty moving her left arm and shoulder. Will now set her up with an orthopedic consult for further evaluation and treatment.  Meanwhile she will continue all her medications as such.    No other concerns at this time.   Past Medical History:  Diagnosis Date   Cervical myelopathy with cervical radiculopathy    Chronic kidney disease 01/22/2019   Depression    Hypertension    Seizure 02/25/2017   Sleep apnea    Syncope and collapse 02/25/2017    Past Surgical History:  Procedure Laterality Date   CATARACT EXTRACTION     CATARACT EXTRACTION W/PHACO Left 04/12/2021   Procedure: CATARACT EXTRACTION PHACO AND INTRAOCULAR LENS PLACEMENT ([facility]) LEFT 8.12 01:13.1;  Surgeon: Lockie Mola, MD;  Location: [facility];  Service: Ophthalmology;  Laterality: Left;   COLONOSCOPY     COLONOSCOPY WITH PROPOFOL N/A 12/04/2018   Procedure: COLONOSCOPY WITH PROPOFOL;  Surgeon: Toledo, Boykin Nearing, MD;  Location: [facility];  Service: Gastroenterology;  Laterality: N/A;   EYE SURGERY     FOOT SURGERY Left    NECK SURGERY     SPINE SURGERY     ACDF C3-4   VAGINAL HYSTERECTOMY      Social History   Socioeconomic History   Marital status: Married    Spouse name: Not on file   Number of children: 1   Years of education: Not on file   Highest education level: Not on file  Occupational History   Occupation: retired  Tobacco Use   Smoking status: Never   Smokeless tobacco: Never  Vaping Use   Vaping Use: Never used  Substance and Sexual Activity   Alcohol use: No     Alcohol/week: 0.0 standard drinks of alcohol   Drug use: No   Sexual activity: Not on file  Other Topics Concern   Not on file  Social History Narrative   Lives with husband in a one story home.  Has one child.     Retired from  [employer] and [employer].     Right handed   Social Determinants of Health   Financial Resource Strain: Not on file  Food Insecurity: No Food Insecurity (03/01/2022)   Hunger Vital Sign    Worried About Running Out of Food in the Last Year: Never true    Ran Out of Food in the Last Year: Never true  Transportation Needs: No Transportation Needs (03/01/2022)   PRAPARE - Administrator, Civil Service (Medical): No    Lack of Transportation (Non-Medical): No  Physical Activity: Not on file  Stress: Not on file  Social Connections: Not on file  Intimate Partner Violence: Not At Risk (03/01/2022)   Humiliation, Afraid, Rape, and Kick questionnaire    Fear of Current or Ex-Partner: No    Emotionally Abused: No    Physically Abused: No    Sexually Abused: No    Family History  Problem Relation Age of Onset   Hypertension Mother    Kidney failure Mother  Heart disease Mother    Hypertension Father    Cancer Father    Hypertension Sister    Hypertension Sister    Healthy Son    Breast cancer Neg Hx     Allergies  Allergen Reactions   Simvastatin Rash    Review of Systems  Constitutional: Negative.   HENT: Negative.    Eyes: Negative.   Respiratory: Negative.    Cardiovascular: Negative.   Gastrointestinal: Negative.   Genitourinary: Negative.   Musculoskeletal:  Positive for joint pain (Left shoulder).  Skin: Negative.   Neurological: Negative.   Endo/Heme/Allergies: Negative.   Psychiatric/Behavioral: Negative.         Objective:   BP 132/70   Pulse 63   Ht  (1.626 m)   Wt 150 lb 12.8 oz (68.4 kg)   LMP  (LMP Unknown)   SpO2 96%   BMI 25.88 kg/m   Vitals:   05/25/22 0926  BP: 132/70  Pulse: 63  Height:  (1.626  m)  Weight: 150 lb 12.8 oz (68.4 kg)  SpO2: 96%  BMI (Calculated): 25.87    Physical Exam Vitals and nursing note reviewed.  Constitutional:      Appearance: Normal appearance.  Pulmonary:     Effort: Pulmonary effort is normal.     Breath sounds: Normal breath sounds.  Abdominal:     Palpations: Abdomen is soft.  Musculoskeletal:        General: Normal range of motion.     Cervical back: Normal range of motion and neck supple.  Skin:    General: Skin is warm.  Neurological:     General: No focal deficit present.     Mental Status: She is alert and oriented to person, place, and time.      No results found for any visits on 05/25/22.      Assessment & Plan:  Patient will continue all her medications for now.  Schedule an orthopedic consult. Problem List Items Addressed This Visit     Hyperlipemia   Essential hypertension, benign   Chronic left shoulder pain - Primary   Relevant Orders   Ambulatory referral to Orthopedics   Adhesive capsulitis of left shoulder   Impaired glucose tolerance    Return in about 3 months (around 08/24/2022).   Total time spent: 20 minutes  Margaretann Loveless, MD  05/25/2022

## 2022-06-12 ENCOUNTER — Other Ambulatory Visit: Payer: Self-pay | Admitting: Neurology

## 2022-06-12 DIAGNOSIS — G40909 Epilepsy, unspecified, not intractable, without status epilepticus: Secondary | ICD-10-CM

## 2022-06-16 ENCOUNTER — Other Ambulatory Visit: Payer: Self-pay | Admitting: Internal Medicine

## 2022-06-16 DIAGNOSIS — M1000 Idiopathic gout, unspecified site: Secondary | ICD-10-CM

## 2022-07-05 ENCOUNTER — Other Ambulatory Visit: Payer: Self-pay | Admitting: Internal Medicine

## 2022-08-20 ENCOUNTER — Other Ambulatory Visit: Payer: Self-pay | Admitting: Internal Medicine

## 2022-08-20 ENCOUNTER — Other Ambulatory Visit: Payer: Medicare Other

## 2022-08-20 DIAGNOSIS — E782 Mixed hyperlipidemia: Secondary | ICD-10-CM

## 2022-08-20 DIAGNOSIS — M1000 Idiopathic gout, unspecified site: Secondary | ICD-10-CM

## 2022-08-20 DIAGNOSIS — R7302 Impaired glucose tolerance (oral): Secondary | ICD-10-CM

## 2022-08-20 DIAGNOSIS — I10 Essential (primary) hypertension: Secondary | ICD-10-CM

## 2022-08-21 LAB — CBC WITH DIFFERENTIAL
Basophils Absolute: 0 10*3/uL (ref 0.0–0.2)
Basos: 1 %
EOS (ABSOLUTE): 0.3 10*3/uL (ref 0.0–0.4)
Eos: 5 %
Hematocrit: 37.8 % (ref 34.0–46.6)
Hemoglobin: 11.6 g/dL (ref 11.1–15.9)
Immature Grans (Abs): 0 10*3/uL (ref 0.0–0.1)
Immature Granulocytes: 0 %
Lymphocytes Absolute: 2 10*3/uL (ref 0.7–3.1)
Lymphs: 37 %
MCH: 26 pg — ABNORMAL LOW (ref 26.6–33.0)
MCHC: 30.7 g/dL — ABNORMAL LOW (ref 31.5–35.7)
MCV: 85 fL (ref 79–97)
Monocytes Absolute: 0.5 10*3/uL (ref 0.1–0.9)
Monocytes: 9 %
Neutrophils Absolute: 2.7 10*3/uL (ref 1.4–7.0)
Neutrophils: 48 %
RBC: 4.46 x10E6/uL (ref 3.77–5.28)
RDW: 13.8 % (ref 11.7–15.4)
WBC: 5.6 10*3/uL (ref 3.4–10.8)

## 2022-08-21 LAB — LIPID PANEL W/O CHOL/HDL RATIO
Cholesterol, Total: 211 mg/dL — ABNORMAL HIGH (ref 100–199)
HDL: 59 mg/dL (ref >39)
LDL Chol Calc (NIH): 139 mg/dL — ABNORMAL HIGH (ref 0–99)
Triglycerides: 72 mg/dL (ref 0–149)
VLDL Cholesterol Cal: 13 mg/dL (ref 5–40)

## 2022-08-21 LAB — CMP14+EGFR
ALT: 12 IU/L (ref 0–32)
AST: 15 IU/L (ref 0–40)
Albumin: 4.2 g/dL (ref 3.7–4.7)
Alkaline Phosphatase: 103 IU/L (ref 44–121)
BUN/Creatinine Ratio: 15 (ref 12–28)
BUN: 17 mg/dL (ref 8–27)
Bilirubin Total: 0.6 mg/dL (ref 0.0–1.2)
CO2: 26 mmol/L (ref 20–29)
Calcium: 9.7 mg/dL (ref 8.7–10.3)
Chloride: 107 mmol/L — ABNORMAL HIGH (ref 96–106)
Creatinine, Ser: 1.11 mg/dL — ABNORMAL HIGH (ref 0.57–1.00)
Globulin, Total: 2.4 g/dL (ref 1.5–4.5)
Glucose: 92 mg/dL (ref 70–99)
Potassium: 3.6 mmol/L (ref 3.5–5.2)
Sodium: 146 mmol/L — ABNORMAL HIGH (ref 134–144)
Total Protein: 6.6 g/dL (ref 6.0–8.5)
eGFR: 50 mL/min/{1.73_m2} — ABNORMAL LOW (ref >59)

## 2022-08-21 LAB — HEMOGLOBIN A1C
Est. average glucose Bld gHb Est-mCnc: 114 mg/dL
Hgb A1c MFr Bld: 5.6 % (ref 4.8–5.6)

## 2022-08-21 LAB — URIC ACID: Uric Acid: 5.5 mg/dL (ref 3.1–7.9)

## 2022-08-24 ENCOUNTER — Encounter: Payer: Self-pay | Admitting: Internal Medicine

## 2022-08-24 ENCOUNTER — Ambulatory Visit (INDEPENDENT_AMBULATORY_CARE_PROVIDER_SITE_OTHER): Payer: Medicare Other | Admitting: Internal Medicine

## 2022-08-24 VITALS — BP 160/90 | HR 61 | Ht 64.0 in | Wt 149.4 lb

## 2022-08-24 DIAGNOSIS — E782 Mixed hyperlipidemia: Secondary | ICD-10-CM | POA: Diagnosis not present

## 2022-08-24 DIAGNOSIS — R7302 Impaired glucose tolerance (oral): Secondary | ICD-10-CM

## 2022-08-24 DIAGNOSIS — I10 Essential (primary) hypertension: Secondary | ICD-10-CM | POA: Diagnosis not present

## 2022-08-24 DIAGNOSIS — M1A39X0 Chronic gout due to renal impairment, multiple sites, without tophus (tophi): Secondary | ICD-10-CM

## 2022-08-24 DIAGNOSIS — N1831 Chronic kidney disease, stage 3a: Secondary | ICD-10-CM

## 2022-08-24 MED ORDER — LOSARTAN POTASSIUM 100 MG PO TABS: 100.0000 mg | ORAL_TABLET | Freq: Every day | ORAL | 3 refills | Status: DC

## 2022-08-24 NOTE — Progress Notes (Signed)
Established Patient Office Visit  Subjective:  Patient ID: Anna Chambers, female    DOB: 1941-01-14  Age: 82 y.o. MRN: [id number]  Chief Complaint  Patient presents with   Follow-up    3 month follow up    Patient comes in for follow up. Recently her BP has been high with some fluid retention. Seen by renal , and Torsemide was added, along with increasing dose of Losartan to 50 mg/d. Today her ankle swelling has gone down but BP is still high- Will increase Losartan to 100 mg/d. Her left shoulder pain has resolved. Labs discussed- LDL is up this time.    No other concerns at this time.   Past Medical History:  Diagnosis Date   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HCC)    Chronic kidney disease 01/22/2019   Depression    Hypertension    Seizure (HCC) 02/25/2017   Sleep apnea    Syncope and collapse 02/25/2017    Past Surgical History:  Procedure Laterality Date   CATARACT EXTRACTION     CATARACT EXTRACTION W/PHACO Left 04/12/2021   Procedure: CATARACT EXTRACTION PHACO AND INTRAOCULAR LENS PLACEMENT ([facility]) LEFT 8.12 01:13.1;  Surgeon: Lockie Mola, MD;  Location: [facility];  Service: Ophthalmology;  Laterality: Left;   COLONOSCOPY     COLONOSCOPY WITH PROPOFOL N/A 12/04/2018   Procedure: COLONOSCOPY WITH PROPOFOL;  Surgeon: Toledo, Boykin Nearing, MD;  Location: [facility];  Service: Gastroenterology;  Laterality: N/A;   EYE SURGERY     FOOT SURGERY Left    NECK SURGERY     SPINE SURGERY     ACDF C3-4   VAGINAL HYSTERECTOMY      Social History   Socioeconomic History   Marital status: Married    Spouse name: Not on file   Number of children: 1   Years of education: Not on file   Highest education level: Not on file  Occupational History   Occupation: retired  Tobacco Use   Smoking status: Never   Smokeless tobacco: Never  Vaping Use   Vaping status: Never Used  Substance and Sexual Activity   Alcohol use: No    Alcohol/week:  0.0 standard drinks of alcohol   Drug use: No   Sexual activity: Not on file  Other Topics Concern   Not on file  Social History Narrative   Lives with husband in a one story home.  Has one child.     Retired from  [employer] and [employer].     Right handed   Social Determinants of Health   Financial Resource Strain: Not on file  Food Insecurity: No Food Insecurity (03/01/2022)   Hunger Vital Sign    Worried About Running Out of Food in the Last Year: Never true    Ran Out of Food in the Last Year: Never true  Transportation Needs: No Transportation Needs (03/01/2022)   PRAPARE - Administrator, Civil Service (Medical): No    Lack of Transportation (Non-Medical): No  Physical Activity: Not on file  Stress: Not on file  Social Connections: Not on file  Intimate Partner Violence: Not At Risk (03/01/2022)   Humiliation, Afraid, Rape, and Kick questionnaire    Fear of Current or Ex-Partner: No    Emotionally Abused: No    Physically Abused: No    Sexually Abused: No    Family History  Problem Relation Age of Onset   Hypertension Mother    Kidney failure Mother  Heart disease Mother    Hypertension Father    Cancer Father    Hypertension Sister    Hypertension Sister    Healthy Son    Breast cancer Neg Hx     Allergies  Allergen Reactions   Simvastatin Rash    Review of Systems  Constitutional: Negative.   HENT: Negative.  Negative for congestion, hearing loss and nosebleeds.   Eyes: Negative.   Respiratory: Negative.  Negative for cough and shortness of breath.   Cardiovascular: Negative.  Negative for chest pain, palpitations and leg swelling.  Gastrointestinal: Negative.  Negative for abdominal pain, constipation, diarrhea, heartburn, nausea and vomiting.  Genitourinary: Negative.  Negative for dysuria and flank pain.  Musculoskeletal: Negative.  Negative for joint pain and myalgias.  Skin: Negative.   Neurological:  Positive for headaches. Negative for  dizziness, tingling and tremors.  Endo/Heme/Allergies: Negative.   Psychiatric/Behavioral: Negative.  Negative for depression and suicidal ideas. The patient is not nervous/anxious.        Objective:   BP (!) 160/90   Pulse 61   Ht 5\' 4"  (1.626 m)   Wt 149 lb 6.4 oz (67.8 kg)   LMP  (LMP Unknown)   SpO2 96%   BMI 25.64 kg/m   Vitals:   08/24/22 0915  BP: (!) 160/90  Pulse: 61  Height: 5\' 4"  (1.626 m)  Weight: 149 lb 6.4 oz (67.8 kg)  SpO2: 96%  BMI (Calculated): 25.63    Physical Exam Vitals and nursing note reviewed.  Constitutional:      Appearance: Normal appearance.  HENT:     Head: Normocephalic and atraumatic.     Nose: Nose normal.     Mouth/Throat:     Mouth: Mucous membranes are moist.     Pharynx: Oropharynx is clear.  Eyes:     Conjunctiva/sclera: Conjunctivae normal.     Pupils: Pupils are equal, round, and reactive to light.  Cardiovascular:     Rate and Rhythm: Normal rate and regular rhythm.     Pulses: Normal pulses.     Heart sounds: Normal heart sounds. No murmur heard. Pulmonary:     Effort: Pulmonary effort is normal.     Breath sounds: Normal breath sounds. No wheezing.  Abdominal:     General: Bowel sounds are normal.     Palpations: Abdomen is soft.     Tenderness: There is no abdominal tenderness. There is no right CVA tenderness or left CVA tenderness.  Musculoskeletal:        General: Normal range of motion.     Cervical back: Normal range of motion.     Right lower leg: No edema.     Left lower leg: No edema.  Skin:    General: Skin is warm and dry.  Neurological:     General: No focal deficit present.     Mental Status: She is alert and oriented to person, place, and time.  Psychiatric:        Mood and Affect: Mood normal.        Behavior: Behavior normal.      No results found for any visits on 08/24/22.  Recent Results (from the past 2160 hour(s))  CBC With Differential     Status: Abnormal   Collection Time:  08/20/22  2:13 PM  Result Value Ref Range   WBC 5.6 3.4 - 10.8 x10E3/uL   RBC 4.46 3.77 - 5.28 x10E6/uL   Hemoglobin 11.6 11.1 - 15.9 g/dL   Hematocrit  37.8 34.0 - 46.6 %   MCV 85 79 - 97 fL   MCH 26.0 (L) 26.6 - 33.0 pg   MCHC 30.7 (L) 31.5 - 35.7 g/dL   RDW 16.1 09.6 - 04.5 %   Neutrophils 48 Not Estab. %   Lymphs 37 Not Estab. %   Monocytes 9 Not Estab. %   Eos 5 Not Estab. %   Basos 1 Not Estab. %   Neutrophils Absolute 2.7 1.4 - 7.0 x10E3/uL   Lymphocytes Absolute 2.0 0.7 - 3.1 x10E3/uL   Monocytes Absolute 0.5 0.1 - 0.9 x10E3/uL   EOS (ABSOLUTE) 0.3 0.0 - 0.4 x10E3/uL   Basophils Absolute 0.0 0.0 - 0.2 x10E3/uL   Immature Granulocytes 0 Not Estab. %   Immature Grans (Abs) 0.0 0.0 - 0.1 x10E3/uL    Comment: **Effective September 03, 2022, profile [id number] CBC/Differential**   (No Platelet) will be made non-orderable. [facility] Offers:   [id number] CBC With Differential/Platelet   Uric acid     Status: None   Collection Time: 08/20/22  2:13 PM  Result Value Ref Range   Uric Acid 5.5 3.1 - 7.9 mg/dL    Comment:            Therapeutic target for gout patients: <6.0  CMP14+EGFR     Status: Abnormal   Collection Time: 08/20/22  2:13 PM  Result Value Ref Range   Glucose 92 70 - 99 mg/dL   BUN 17 8 - 27 mg/dL   Creatinine, Ser 9.14 (H) 0.57 - 1.00 mg/dL   eGFR 50 (L) >78 GN/FAO/1.30   BUN/Creatinine Ratio 15 12 - 28   Sodium 146 (H) 134 - 144 mmol/L   Potassium 3.6 3.5 - 5.2 mmol/L   Chloride 107 (H) 96 - 106 mmol/L   CO2 26 20 - 29 mmol/L   Calcium 9.7 8.7 - 10.3 mg/dL   Total Protein 6.6 6.0 - 8.5 g/dL   Albumin 4.2 3.7 - 4.7 g/dL   Globulin, Total 2.4 1.5 - 4.5 g/dL   Bilirubin Total 0.6 0.0 - 1.2 mg/dL   Alkaline Phosphatase 103 44 - 121 IU/L   AST 15 0 - 40 IU/L   ALT 12 0 - 32 IU/L  Lipid Panel w/o Chol/HDL Ratio     Status: Abnormal   Collection Time: 08/20/22  2:13 PM  Result Value Ref Range   Cholesterol, Total 211 (H) 100 - 199 mg/dL   Triglycerides 72 0 - 149 mg/dL    HDL 59 >86 mg/dL   VLDL Cholesterol Cal 13 5 - 40 mg/dL   LDL Chol Calc (NIH) 578 (H) 0 - 99 mg/dL  Hemoglobin I6N     Status: None   Collection Time: 08/20/22  2:13 PM  Result Value Ref Range   Hgb A1c MFr Bld 5.6 4.8 - 5.6 %    Comment:          Prediabetes: 5.7 - 6.4          Diabetes: >6.4          Glycemic control for adults with diabetes: <7.0    Est. average glucose Bld gHb Est-mCnc 114 mg/dL      Assessment & Plan:  Increase Losartan to 100 mg/d. Monitor BP. Strict diet control for Lipids- consider low dose statin. Problem List Items Addressed This Visit     Chronic kidney disease   Hyperlipemia   Relevant Medications   torsemide (DEMADEX) 10 MG tablet   Essential hypertension, benign - Primary  Relevant Medications   torsemide (DEMADEX) 10 MG tablet   Gout   Impaired glucose tolerance    Return in about 2 weeks (around 09/07/2022).   Total time spent: 30 minutes  Margaretann Loveless, MD  08/24/2022   This document may have been prepared by [facility] Voice Recognition software and as such may include unintentional dictation errors.

## 2022-08-27 ENCOUNTER — Other Ambulatory Visit: Payer: Self-pay | Admitting: Internal Medicine

## 2022-09-11 ENCOUNTER — Encounter: Payer: Self-pay | Admitting: Internal Medicine

## 2022-09-11 ENCOUNTER — Ambulatory Visit: Payer: Medicare Other | Admitting: Internal Medicine

## 2022-09-11 VITALS — BP 126/72 | HR 59 | Ht 64.0 in | Wt 144.0 lb

## 2022-09-11 DIAGNOSIS — I10 Essential (primary) hypertension: Secondary | ICD-10-CM | POA: Diagnosis not present

## 2022-09-11 DIAGNOSIS — E782 Mixed hyperlipidemia: Secondary | ICD-10-CM

## 2022-09-11 DIAGNOSIS — M1A39X0 Chronic gout due to renal impairment, multiple sites, without tophus (tophi): Secondary | ICD-10-CM | POA: Diagnosis not present

## 2022-09-11 DIAGNOSIS — N1831 Chronic kidney disease, stage 3a: Secondary | ICD-10-CM | POA: Diagnosis not present

## 2022-09-11 NOTE — Progress Notes (Signed)
Established Patient Office Visit  Subjective:  Patient ID: Anna Chambers, female    DOB: [birth year]  Age: 82 y.o. MRN: [id number]  Chief Complaint  Patient presents with   Follow-up    3 week follow up    Patient comes in for her blood pressure follow-up today.  It is looking much better since her dose of losartan was increased to 100 mg/day.  There is no further leg edema and she has lost a few pounds as well.  In general she is feeling quite well.  Mentions mild fatigue but otherwise feels well.  Denies any chest pain or shortness of breath. She will be seeing her nephrologist tomorrow and will get blood work done over there.    No other concerns at this time.   Past Medical History:  Diagnosis Date   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HCC)    Chronic kidney disease 01/22/2019   Depression    Hypertension    Seizure (HCC) 02/25/2017   Sleep apnea    Syncope and collapse 02/25/2017    Past Surgical History:  Procedure Laterality Date   CATARACT EXTRACTION     CATARACT EXTRACTION W/PHACO Left 04/12/2021   Procedure: CATARACT EXTRACTION PHACO AND INTRAOCULAR LENS PLACEMENT (IOC) LEFT 8.12 01:13.1;  Surgeon: Lockie Mola, MD;  Location: [facility];  Service: Ophthalmology;  Laterality: Left;   COLONOSCOPY     COLONOSCOPY WITH PROPOFOL N/A 12/04/2018   Procedure: COLONOSCOPY WITH PROPOFOL;  Surgeon: Toledo, Boykin Nearing, MD;  Location: [facility];  Service: Gastroenterology;  Laterality: N/A;   EYE SURGERY     FOOT SURGERY Left    NECK SURGERY     SPINE SURGERY     ACDF C3-4   VAGINAL HYSTERECTOMY      Social History   Socioeconomic History   Marital status: Married    Spouse name: Not on file   Number of children: 1   Years of education: Not on file   Highest education level: Not on file  Occupational History   Occupation: retired  Tobacco Use   Smoking status: Never   Smokeless tobacco: Never  Vaping Use   Vaping status: Never  Used  Substance and Sexual Activity   Alcohol use: No    Alcohol/week: 0.0 standard drinks of alcohol   Drug use: No   Sexual activity: Not on file  Other Topics Concern   Not on file  Social History Narrative   Lives with husband in a one story home.  Has one child.     Retired from  [employer] and [employer].     Right handed   Social Determinants of Health   Financial Resource Strain: Not on file  Food Insecurity: No Food Insecurity (03/01/2022)   Hunger Vital Sign    Worried About Running Out of Food in the Last Year: Never true    Ran Out of Food in the Last Year: Never true  Transportation Needs: No Transportation Needs (03/01/2022)   PRAPARE - Administrator, Civil Service (Medical): No    Lack of Transportation (Non-Medical): No  Physical Activity: Not on file  Stress: Not on file  Social Connections: Not on file  Intimate Partner Violence: Not At Risk (03/01/2022)   Humiliation, Afraid, Rape, and Kick questionnaire    Fear of Current or Ex-Partner: No    Emotionally Abused: No    Physically Abused: No    Sexually Abused: No    Family History  Problem  Relation Age of Onset   Hypertension Mother    Kidney failure Mother    Heart disease Mother    Hypertension Father    Cancer Father    Hypertension Sister    Hypertension Sister    Healthy Son    Breast cancer Neg Hx     Allergies  Allergen Reactions   Simvastatin Rash    Review of Systems  Constitutional: Negative.   HENT: Negative.    Eyes: Negative.   Respiratory: Negative.  Negative for cough and shortness of breath.   Cardiovascular: Negative.  Negative for chest pain, palpitations and leg swelling.  Gastrointestinal: Negative.  Negative for abdominal pain, constipation, diarrhea, heartburn, nausea and vomiting.  Genitourinary: Negative.  Negative for dysuria and flank pain.  Musculoskeletal: Negative.  Negative for joint pain and myalgias.  Skin: Negative.   Neurological: Negative.  Negative for  dizziness and headaches.  Endo/Heme/Allergies: Negative.   Psychiatric/Behavioral: Negative.  Negative for depression and suicidal ideas. The patient is not nervous/anxious.        Objective:   BP 126/72   Pulse (!) 59   Ht 5\' 4"  (1.626 m)   Wt 144 lb (65.3 kg)   LMP  (LMP Unknown)   SpO2 99%   BMI 24.72 kg/m   Vitals:   09/11/22 1014  BP: 126/72  Pulse: (!) 59  Height: 5\' 4"  (1.626 m)  Weight: 144 lb (65.3 kg)  SpO2: 99%  BMI (Calculated): 24.71    Physical Exam Vitals and nursing note reviewed.  Constitutional:      Appearance: Normal appearance.  HENT:     Head: Normocephalic and atraumatic.     Nose: Nose normal.     Mouth/Throat:     Mouth: Mucous membranes are moist.     Pharynx: Oropharynx is clear.  Eyes:     Conjunctiva/sclera: Conjunctivae normal.     Pupils: Pupils are equal, round, and reactive to light.  Cardiovascular:     Rate and Rhythm: Normal rate and regular rhythm.     Pulses: Normal pulses.     Heart sounds: Normal heart sounds. No murmur heard. Pulmonary:     Effort: Pulmonary effort is normal.     Breath sounds: Normal breath sounds. No wheezing.  Abdominal:     General: Bowel sounds are normal.     Palpations: Abdomen is soft.     Tenderness: There is no abdominal tenderness. There is no right CVA tenderness or left CVA tenderness.  Musculoskeletal:        General: Normal range of motion.     Cervical back: Normal range of motion.     Right lower leg: No edema.     Left lower leg: No edema.  Skin:    General: Skin is warm and dry.  Neurological:     General: No focal deficit present.     Mental Status: She is alert and oriented to person, place, and time.  Psychiatric:        Mood and Affect: Mood normal.        Behavior: Behavior normal.      No results found for any visits on 09/11/22.  Recent Results (from the past 2160 hour(s))  CBC With Differential     Status: Abnormal   Collection Time: 08/20/22  2:13 PM  Result  Value Ref Range   WBC 5.6 3.4 - 10.8 x10E3/uL   RBC 4.46 3.77 - 5.28 x10E6/uL   Hemoglobin 11.6 11.1 - 15.9 g/dL  Hematocrit 37.8 34.0 - 46.6 %   MCV 85 79 - 97 fL   MCH 26.0 (L) 26.6 - 33.0 pg   MCHC 30.7 (L) 31.5 - 35.7 g/dL   RDW 16.1 09.6 - 04.5 %   Neutrophils 48 Not Estab. %   Lymphs 37 Not Estab. %   Monocytes 9 Not Estab. %   Eos 5 Not Estab. %   Basos 1 Not Estab. %   Neutrophils Absolute 2.7 1.4 - 7.0 x10E3/uL   Lymphocytes Absolute 2.0 0.7 - 3.1 x10E3/uL   Monocytes Absolute 0.5 0.1 - 0.9 x10E3/uL   EOS (ABSOLUTE) 0.3 0.0 - 0.4 x10E3/uL   Basophils Absolute 0.0 0.0 - 0.2 x10E3/uL   Immature Granulocytes 0 Not Estab. %   Immature Grans (Abs) 0.0 0.0 - 0.1 x10E3/uL    Comment: **Effective September 03, 2022, profile [id number] CBC/Differential**   (No Platelet) will be made non-orderable. [facility] Offers:   [id number] CBC With Differential/Platelet   Uric acid     Status: None   Collection Time: 08/20/22  2:13 PM  Result Value Ref Range   Uric Acid 5.5 3.1 - 7.9 mg/dL    Comment:            Therapeutic target for gout patients: <6.0  CMP14+EGFR     Status: Abnormal   Collection Time: 08/20/22  2:13 PM  Result Value Ref Range   Glucose 92 70 - 99 mg/dL   BUN 17 8 - 27 mg/dL   Creatinine, Ser 9.14 (H) 0.57 - 1.00 mg/dL   eGFR 50 (L) >78 GN/FAO/1.30   BUN/Creatinine Ratio 15 12 - 28   Sodium 146 (H) 134 - 144 mmol/L   Potassium 3.6 3.5 - 5.2 mmol/L   Chloride 107 (H) 96 - 106 mmol/L   CO2 26 20 - 29 mmol/L   Calcium 9.7 8.7 - 10.3 mg/dL   Total Protein 6.6 6.0 - 8.5 g/dL   Albumin 4.2 3.7 - 4.7 g/dL   Globulin, Total 2.4 1.5 - 4.5 g/dL   Bilirubin Total 0.6 0.0 - 1.2 mg/dL   Alkaline Phosphatase 103 44 - 121 IU/L   AST 15 0 - 40 IU/L   ALT 12 0 - 32 IU/L  Lipid Panel w/o Chol/HDL Ratio     Status: Abnormal   Collection Time: 08/20/22  2:13 PM  Result Value Ref Range   Cholesterol, Total 211 (H) 100 - 199 mg/dL   Triglycerides 72 0 - 149 mg/dL   HDL 59 >86 mg/dL   VLDL  Cholesterol Cal 13 5 - 40 mg/dL   LDL Chol Calc (NIH) 578 (H) 0 - 99 mg/dL  Hemoglobin I6N     Status: None   Collection Time: 08/20/22  2:13 PM  Result Value Ref Range   Hgb A1c MFr Bld 5.6 4.8 - 5.6 %    Comment:          Prediabetes: 5.7 - 6.4          Diabetes: >6.4          Glycemic control for adults with diabetes: <7.0    Est. average glucose Bld gHb Est-mCnc 114 mg/dL      Assessment & Plan:  Continue current medications. Problem List Items Addressed This Visit     Chronic kidney disease   Hyperlipemia   Essential hypertension, benign - Primary   Gout    Return in about 2 months (around 11/11/2022).   Total time spent: 25 minutes  Margaretann Loveless,  MD  09/11/2022   This document may have been prepared by [facility] Voice Recognition software and as such may include unintentional dictation errors.

## 2022-09-14 ENCOUNTER — Ambulatory Visit: Payer: Medicare Other | Admitting: Neurology

## 2022-09-17 ENCOUNTER — Ambulatory Visit (INDEPENDENT_AMBULATORY_CARE_PROVIDER_SITE_OTHER): Payer: Medicare Other | Admitting: Neurology

## 2022-09-17 ENCOUNTER — Encounter: Payer: Self-pay | Admitting: Neurology

## 2022-09-17 VITALS — BP 154/77 | HR 66 | Ht 64.0 in | Wt 144.0 lb

## 2022-09-17 DIAGNOSIS — G40909 Epilepsy, unspecified, not intractable, without status epilepticus: Secondary | ICD-10-CM | POA: Diagnosis not present

## 2022-09-17 DIAGNOSIS — Z981 Arthrodesis status: Secondary | ICD-10-CM

## 2022-09-17 DIAGNOSIS — M5417 Radiculopathy, lumbosacral region: Secondary | ICD-10-CM

## 2022-09-17 NOTE — Patient Instructions (Addendum)
We will refer you to out-patient physical therapy for balance training and left foot strengthening  Continue Keppra 500mg  twice daily  Recommend that you try to wear your foot brace
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
## 2022-10-10 ENCOUNTER — Ambulatory Visit: Payer: Medicare Other | Attending: Neurology

## 2022-10-10 DIAGNOSIS — R296 Repeated falls: Secondary | ICD-10-CM | POA: Insufficient documentation

## 2022-10-10 DIAGNOSIS — G40909 Epilepsy, unspecified, not intractable, without status epilepticus: Secondary | ICD-10-CM | POA: Insufficient documentation

## 2022-10-10 DIAGNOSIS — R270 Ataxia, unspecified: Secondary | ICD-10-CM | POA: Diagnosis present

## 2022-10-10 DIAGNOSIS — M5417 Radiculopathy, lumbosacral region: Secondary | ICD-10-CM | POA: Diagnosis not present

## 2022-10-10 DIAGNOSIS — R2681 Unsteadiness on feet: Secondary | ICD-10-CM | POA: Diagnosis present

## 2022-10-10 DIAGNOSIS — Z981 Arthrodesis status: Secondary | ICD-10-CM | POA: Insufficient documentation

## 2022-10-10 NOTE — Therapy (Signed)
[facility] [address], [place], [address] Phone: [phone]   Fax:  [phone]  [facility] Evaluation  Patient Details  Name: Anna Chambers MRN: [id number] Date of Birth: 1940/07/04 Referring Provider (PT): Glendale Chard Reason for PT services: REHABILITATION   Encounter Date: 10/10/2022   PT End of Session - 10/10/22 1438     Visit Number 1    Number of Visits 16    Date for PT Re-Evaluation 12/05/22    Authorization Type [employer] Medicare    Authorization Time Period 10/10/22-12/05/22    Progress Note Due on Visit 10    PT Start Time 1345    PT Stop Time 1430    PT Time Calculation (min) 45 min    Activity Tolerance Patient tolerated treatment well;No increased pain    Behavior During Therapy [facility] for tasks assessed/performed             Past Medical History:  Diagnosis Date   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HCC)    Chronic kidney disease 01/22/2019   Depression    Hypertension    Seizure (HCC) 02/25/2017   Sleep apnea    Syncope and collapse 02/25/2017    Past Surgical History:  Procedure Laterality Date   CATARACT EXTRACTION     CATARACT EXTRACTION W/PHACO Left 04/12/2021   Procedure: CATARACT EXTRACTION PHACO AND INTRAOCULAR LENS PLACEMENT (IOC) LEFT 8.12 01:13.1;  Surgeon: Lockie Mola, MD;  Location: [facility];  Service: Ophthalmology;  Laterality: Left;   COLONOSCOPY     COLONOSCOPY WITH PROPOFOL N/A 12/04/2018   Procedure: COLONOSCOPY WITH PROPOFOL;  Surgeon: Toledo, Boykin Nearing, MD;  Location: [facility];  Service: Gastroenterology;  Laterality: N/A;   EYE SURGERY     FOOT SURGERY Left    NECK SURGERY     SPINE SURGERY     ACDF C3-4   VAGINAL HYSTERECTOMY      There were no vitals filed for this visit.    Subjective Assessment - 10/10/22 1352     Subjective Pt wants help with balance changes and recent falls.    Pertinent History  Anna Chambers is an 81yoF who is referred to OPPT by bu neurology for ongoing imbalance and unsteadiness of gait related to cervical myelopathy. Pt also has a history of Left sided foot drop, and recent glaucoma all of which also affect her balance. Pt is mostly household AMB with SPC, RW or furniture, but goes to grocery weekly with family. Pt has 1 step to enter with railing, then home is all on one level. Pt lives with her husband, son there sometimes, grandduaghters visit often as well. Pt does not use her AFO much since it seems bulky and heavy. She started using a SPC and RW >1 years ago at recommendation of Dr. Allena Katz neurology. Pt reports 2 falls in past year related to tripping over objects she could not see in home.    Currently in Pain? No/denies                [facility] PT Assessment - 10/10/22 0001       Assessment   Medical Diagnosis Unsteadiness on feet, cervical myelopathy and gait ataxia    Referring Provider (PT) Glendale Chard    Prior Therapy here 2019      Precautions   Precautions Fall      Balance Screen   Has the patient fallen in the past 6 months  Yes    How many times? 2    Has the patient had a decrease in activity level because of a fear of falling?  Yes    Is the patient reluctant to leave their home because of a fear of falling?  Yes      Observation/Other Assessments   Focus on Therapeutic Outcomes (FOTO)  56      Transfers   Five time sit to stand comments  13.44sec    Comments no LOB, hands free, green chair      Berg Balance Test   Sit to Stand Able to stand without using hands and stabilize independently    Standing Unsupported Able to stand safely 2 minutes    Sitting with Back Unsupported but Feet Supported on Floor or Stool Able to sit safely and securely 2 minutes    Stand to Sit Sits safely with minimal use of hands    Transfers Able to transfer safely, minor use of hands    Standing Unsupported with Eyes Closed Able to stand 10 seconds safely     Standing Unsupported with Feet Together Able to place feet together independently and stand 1 minute safely    From Standing, Reach Forward with Outstretched Arm Can reach forward >12 cm safely (5")    From Standing Position, Pick up Object from Floor Able to pick up shoe safely and easily    From Standing Position, Turn to Look Behind Over each Shoulder Turn sideways only but maintains balance    Turn 360 Degrees Able to turn 360 degrees safely but slowly   looks ataxic, but pt endorses good confidnece, no LOB   Standing Unsupported, Alternately Place Feet on Step/Stool Able to complete >2 steps/needs minimal assist    Standing Unsupported, One Foot in Front Able to take small step independently and hold 30 seconds    Standing on One Leg Tries to lift leg/unable to hold 3 seconds but remains standing independently    Total Score 43             TUG: c SPC gray chair 14.20sec  :  0.71m/s  BBT 43     Objective measurements completed on examination: See above findings.         PT Education -     Education provided Yes    Education Details A combination of skilled PT and home/gym exercises will elicit the greatest improvements in function.    Person(s) Educated Patient    Methods Explanation;Demonstration    Comprehension Verbalized understanding;Need further instruction              PT Short Term Goals -       PT SHORT TERM GOAL #1   Title Pt to report compliance and understanding of home based HEP for balance.    Time 4    Period Weeks    Status New    Target Date 11/07/22      PT SHORT TERM GOAL #2   Title Pt to improve FOTO survey score >35 points to indicate improved confidence in balance.    Baseline 10/10/22: 56    Time 4    Period Weeks    Status New    Target Date 11/07/22               PT Long Term Goals -       PT LONG TERM GOAL #1   Title Pt to improve Ber Balance Test Score >10 point to indicate  reduceds falls risk.    Baseline  10/10/22: 43    Time 2    Period Months    Status New    Target Date 12/10/22      PT LONG TERM GOAL #2   Title Pt to improve Timed up and go test >4 seocnds to indicate reduced falls risk.    Baseline 10/10/22: 14.20sec, SPC    Time 2    Period Months    Status New    Target Date 12/10/22      PT LONG TERM GOAL #3   Title Pt to improve to >1.70m/s to indidcated improved ability to navigate the community for IADL.    Baseline 10/10/22: 0.49m/s c SPC    Time 2    Period Months    Status New    Target Date 12/10/22                    Plan -     Clinical Impression Statement Pt referred to OPPT for gait ataxia and imbalance. Objective tests and measures take place this date, reveal impaired power in 5xSTS, elevated risk of falls correlated with results of TUG, , and BBT. Pt is motivated to remain funcitonal and avoid future falls. Pt will benefit from skilled PT intervention to reduce falls risk and improve th eability to safely participate in IADL and social activity.    Personal Factors and Comorbidities Age;Behavior Pattern    Examination-Activity Limitations Carry;Reach Overhead;Locomotion Level;Stairs;Stand    Examination-Participation Restrictions Medication Management;Laundry;Yard Work;Community Activity    Stability/Clinical Decision Making Evolving/Moderate complexity    Clinical Decision Making Moderate    Rehab Potential Good    PT Frequency 2x / week    PT Duration 8 weeks    PT Treatment/Interventions ADLs/Self Care Home Management;Aquatic Therapy;Biofeedback;Electrical Stimulation;DME Instruction;Gait training;Stair training;Functional mobility training;Therapeutic activities;Therapeutic exercise;Balance training;Neuromuscular re-education;Patient/family education;Orthotic Fit/Training;Manual techniques;Passive range of motion;Dry needling;Energy conservation;Taping;Visual/perceptual remediation/compensation    PT Next Visit Plan establish home safe  balance HEP, begin balance training interventions    PT Home Exercise Plan defer to later visits    Consulted and Agree with Plan of Care Patient             Patient will benefit from skilled therapeutic intervention in order to improve the following deficits and impairments:  Abnormal gait, Decreased activity tolerance, Decreased balance, Decreased endurance, Decreased knowledge of use of DME, Difficulty walking, Impaired perceived functional ability, Impaired sensation, Impaired vision/preception, Decreased coordination, Decreased mobility  Visit Diagnosis: Unsteadiness on feet  Repeated falls  Ataxia     Problem List Patient Active Problem List   Diagnosis Date Noted   Impaired glucose tolerance 05/25/2022   Chronic left shoulder pain 04/05/2022   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   Hypokalemia 02/28/2022   Gout 02/28/2022   Depression 04/17/2019   Displacement of lumbar intervertebral disc without myelopathy 04/17/2019   Glaucoma (increased eye pressure) 04/17/2019   Hyperlipemia 04/17/2019   Lumbar disc disorder with myelopathy 04/17/2019   Osteoporosis, 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   Chronic kidney disease 01/22/2019   Essential hypertension, benign 01/22/2019   Sleep apnea 01/22/2019   History of poliomyelitis 09/24/2018   Hx of adenomatous colonic polyps 09/24/2018   Vitamin B12 deficiency (non anemic) 09/02/2018   Syncope 02/25/2017   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HCC) 01/26/2016   Lumbosacral radiculopathy 01/26/2016   S/P cervical spinal fusion 01/26/2016   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   Obesity (BMI 30-39.9) 12/07/2014   Thoracic or  lumbosacral neuritis or radiculitis 12/03/2011   Cervical spondylosis with myelopathy 12/03/2011   Shortness of breath 05/08/2011   DDD (degenerative disc disease), cervical 01/17/2011   Low back pain 01/09/2011   Pain in joints 10/23/2010   2:51 PM, 10/10/22 Rosamaria Lints, PT,  DPT Physical Therapist - [place] [phone] (Office)   [place] C, PT 10/10/2022, 2:49 PM  [facility] [address] [place], [address] Phone: [phone]   Fax:  [phone]  Name: COREAN KINION MRN: [id number] Date of Birth: 1940-03-26

## 2022-10-15 ENCOUNTER — Encounter: Payer: Self-pay | Admitting: Internal Medicine

## 2022-10-15 ENCOUNTER — Ambulatory Visit (INDEPENDENT_AMBULATORY_CARE_PROVIDER_SITE_OTHER): Payer: Medicare Other | Admitting: Internal Medicine

## 2022-10-15 VITALS — BP 142/76 | HR 67 | Ht 64.0 in | Wt 143.0 lb

## 2022-10-15 DIAGNOSIS — M545 Low back pain, unspecified: Secondary | ICD-10-CM

## 2022-10-15 DIAGNOSIS — I10 Essential (primary) hypertension: Secondary | ICD-10-CM

## 2022-10-15 DIAGNOSIS — B349 Viral infection, unspecified: Secondary | ICD-10-CM

## 2022-10-15 DIAGNOSIS — N1831 Chronic kidney disease, stage 3a: Secondary | ICD-10-CM | POA: Diagnosis not present

## 2022-10-15 DIAGNOSIS — R5383 Other fatigue: Secondary | ICD-10-CM | POA: Diagnosis not present

## 2022-10-15 DIAGNOSIS — R52 Pain, unspecified: Secondary | ICD-10-CM | POA: Diagnosis not present

## 2022-10-15 LAB — POCT XPERT XPRESS SARS COVID-2/FLU/RSV
FLU A: NEGATIVE
FLU B: NEGATIVE
RSV RNA, PCR: NEGATIVE
SARS Coronavirus 2: NEGATIVE

## 2022-10-15 LAB — POCT URINALYSIS DIPSTICK
Bilirubin, UA: NEGATIVE
Blood, UA: NEGATIVE
Glucose, UA: NEGATIVE
Leukocytes, UA: NEGATIVE
Nitrite, UA: NEGATIVE
Protein, UA: POSITIVE — AB
Spec Grav, UA: 1.02 (ref 1.010–1.025)
Urobilinogen, UA: 0.2 U/dL
pH, UA: 6 (ref 5.0–8.0)

## 2022-10-15 NOTE — Addendum Note (Signed)
Addended byKatherine Mantle on: 10/15/2022 02:39 PM   Modules accepted: Orders

## 2022-10-15 NOTE — Progress Notes (Signed)
Established Patient Office Visit  Subjective:  Patient ID: Anna Chambers, female    DOB: 1940-03-10  Age: 82 y.o. MRN: [id number]  Chief Complaint  Patient presents with   Acute Visit    Back pain, cough, headache    Patient comes in with 2 days history of just not feeling well.  Reports of generalized body aches and pains and feeling tired.  She also had mild chills but no fever.  Denies sore throat, no cough, no headache and no chest congestion. No nausea vomiting or diarrhea.  She usually has constipation.  She does not have dysuria but has some back pain. Urine dipstick however is unremarkable. Will check COVID test, suspect viral syndrome.  Although patient states she has not been exposed to anyone with COVID. Meanwhile patient advised to rest, take Tylenol and drink plenty of fluids at home.    No other concerns at this time.   Past Medical History:  Diagnosis Date   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HCC)    Chronic kidney disease 01/22/2019   Depression    Hypertension    Seizure (HCC) 02/25/2017   Sleep apnea    Syncope and collapse 02/25/2017    Past Surgical History:  Procedure Laterality Date   CATARACT EXTRACTION     CATARACT EXTRACTION W/PHACO Left 04/12/2021   Procedure: CATARACT EXTRACTION PHACO AND INTRAOCULAR LENS PLACEMENT (IOC) LEFT 8.12 01:13.1;  Surgeon: Lockie Mola, MD;  Location: [facility];  Service: Ophthalmology;  Laterality: Left;   COLONOSCOPY     COLONOSCOPY WITH PROPOFOL N/A 12/04/2018   Procedure: COLONOSCOPY WITH PROPOFOL;  Surgeon: Toledo, Boykin Nearing, MD;  Location: [facility];  Service: Gastroenterology;  Laterality: N/A;   EYE SURGERY     FOOT SURGERY Left    NECK SURGERY     SPINE SURGERY     ACDF C3-4   VAGINAL HYSTERECTOMY      Social History   Socioeconomic History   Marital status: Married    Spouse name: Not on file   Number of children: 1   Years of education: Not on file   Highest  education level: Not on file  Occupational History   Occupation: retired  Tobacco Use   Smoking status: Never   Smokeless tobacco: Never  Vaping Use   Vaping status: Never Used  Substance and Sexual Activity   Alcohol use: No    Alcohol/week: 0.0 standard drinks of alcohol   Drug use: No   Sexual activity: Not on file  Other Topics Concern   Not on file  Social History Narrative   Lives with husband in a one story home.  Has one child.     Retired from  [employer] and [employer].     Right handed   Social Determinants of Health   Financial Resource Strain: Not on file  Food Insecurity: No Food Insecurity (03/01/2022)   Hunger Vital Sign    Worried About Running Out of Food in the Last Year: Never true    Ran Out of Food in the Last Year: Never true  Transportation Needs: No Transportation Needs (03/01/2022)   PRAPARE - Administrator, Civil Service (Medical): No    Lack of Transportation (Non-Medical): No  Physical Activity: Not on file  Stress: Not on file  Social Connections: Not on file  Intimate Partner Violence: Not At Risk (03/01/2022)   Humiliation, Afraid, Rape, and Kick questionnaire    Fear of Current or Ex-Partner: No  Emotionally Abused: No    Physically Abused: No    Sexually Abused: No    Family History  Problem Relation Age of Onset   Hypertension Mother    Kidney failure Mother    Heart disease Mother    Hypertension Father    Cancer Father    Hypertension Sister    Hypertension Sister    Healthy Son    Breast cancer Neg Hx     Allergies  Allergen Reactions   Simvastatin Rash    Review of Systems  Constitutional:  Positive for chills and malaise/fatigue. Negative for diaphoresis, fever and weight loss.  HENT: Negative.  Negative for congestion, ear pain, sinus pain and sore throat.   Eyes: Negative.   Respiratory: Negative.  Negative for cough, shortness of breath and wheezing.   Cardiovascular: Negative.  Negative for chest pain,  palpitations and leg swelling.  Gastrointestinal: Negative.  Negative for abdominal pain, constipation, diarrhea, heartburn, nausea and vomiting.  Genitourinary: Negative.  Negative for dysuria and flank pain.  Musculoskeletal:  Positive for back pain and myalgias. Negative for joint pain.  Skin: Negative.   Neurological: Negative.  Negative for dizziness and headaches.  Endo/Heme/Allergies: Negative.   Psychiatric/Behavioral: Negative.  Negative for depression and suicidal ideas. The patient is not nervous/anxious.      Objective:   BP (!) 142/76   Pulse 67   Ht 5\' 4"  (1.626 m)   Wt 143 lb (64.9 kg)   LMP  (LMP Unknown)   SpO2 99%   BMI 24.55 kg/m   Vitals:   10/15/22 1318  BP: (!) 142/76  Pulse: 67  Height: 5\' 4"  (1.626 m)  Weight: 143 lb (64.9 kg)  SpO2: 99%  BMI (Calculated): 24.53    Physical Exam Vitals and nursing note reviewed.  Constitutional:      Appearance: Normal appearance.  HENT:     Head: Normocephalic and atraumatic.     Nose: Nose normal.     Mouth/Throat:     Mouth: Mucous membranes are moist.     Pharynx: Oropharynx is clear.  Eyes:     Conjunctiva/sclera: Conjunctivae normal.     Pupils: Pupils are equal, round, and reactive to light.  Cardiovascular:     Rate and Rhythm: Normal rate and regular rhythm.     Pulses: Normal pulses.     Heart sounds: Normal heart sounds. No murmur heard. Pulmonary:     Effort: Pulmonary effort is normal.     Breath sounds: Normal breath sounds. No wheezing.  Abdominal:     General: Bowel sounds are normal.     Palpations: Abdomen is soft.     Tenderness: There is no abdominal tenderness. There is no right CVA tenderness or left CVA tenderness.  Musculoskeletal:        General: Normal range of motion.     Cervical back: Normal range of motion.     Right lower leg: No edema.     Left lower leg: No edema.  Skin:    General: Skin is warm and dry.  Neurological:     General: No focal deficit present.      Mental Status: She is alert and oriented to person, place, and time.  Psychiatric:        Mood and Affect: Mood normal.        Behavior: Behavior normal.      Results for orders placed or performed in visit on 10/15/22  POCT Urinalysis Dipstick (95621)  Result Value  Ref Range   Color, UA orange    Clarity, UA cloudy    Glucose, UA Negative Negative   Bilirubin, UA neg    Ketones, UA trace    Spec Grav, UA 1.020 1.010 - 1.025   Blood, UA neg    pH, UA 6.0 5.0 - 8.0   Protein, UA Positive (A) Negative   Urobilinogen, UA 0.2 0.2 or 1.0 E.U./dL   Nitrite, UA neg    Leukocytes, UA Negative Negative   Appearance cloudy    Odor yes     Recent Results (from the past 2160 hour(s))  CBC With Differential     Status: Abnormal   Collection Time: 08/20/22  2:13 PM  Result Value Ref Range   WBC 5.6 3.4 - 10.8 x10E3/uL   RBC 4.46 3.77 - 5.28 x10E6/uL   Hemoglobin 11.6 11.1 - 15.9 g/dL   Hematocrit 41.6 60.6 - 46.6 %   MCV 85 79 - 97 fL   MCH 26.0 (L) 26.6 - 33.0 pg   MCHC 30.7 (L) 31.5 - 35.7 g/dL   RDW 30.1 60.1 - 09.3 %   Neutrophils 48 Not Estab. %   Lymphs 37 Not Estab. %   Monocytes 9 Not Estab. %   Eos 5 Not Estab. %   Basos 1 Not Estab. %   Neutrophils Absolute 2.7 1.4 - 7.0 x10E3/uL   Lymphocytes Absolute 2.0 0.7 - 3.1 x10E3/uL   Monocytes Absolute 0.5 0.1 - 0.9 x10E3/uL   EOS (ABSOLUTE) 0.3 0.0 - 0.4 x10E3/uL   Basophils Absolute 0.0 0.0 - 0.2 x10E3/uL   Immature Granulocytes 0 Not Estab. %   Immature Grans (Abs) 0.0 0.0 - 0.1 x10E3/uL    Comment: **Effective September 03, 2022, profile [id number] CBC/Differential**   (No Platelet) will be made non-orderable. [facility] Offers:   [id number] CBC With Differential/Platelet   Uric acid     Status: None   Collection Time: 08/20/22  2:13 PM  Result Value Ref Range   Uric Acid 5.5 3.1 - 7.9 mg/dL    Comment:            Therapeutic target for gout patients: <6.0  CMP14+EGFR     Status: Abnormal   Collection Time: 08/20/22  2:13 PM   Result Value Ref Range   Glucose 92 70 - 99 mg/dL   BUN 17 8 - 27 mg/dL   Creatinine, Ser 2.20 (H) 0.57 - 1.00 mg/dL   eGFR 50 (L) >25 [place]/HCW/2.37   BUN/Creatinine Ratio 15 12 - 28   Sodium 146 (H) 134 - 144 mmol/L   Potassium 3.6 3.5 - 5.2 mmol/L   Chloride 107 (H) 96 - 106 mmol/L   CO2 26 20 - 29 mmol/L   Calcium 9.7 8.7 - 10.3 mg/dL   Total Protein 6.6 6.0 - 8.5 g/dL   Albumin 4.2 3.7 - 4.7 g/dL   Globulin, Total 2.4 1.5 - 4.5 g/dL   Bilirubin Total 0.6 0.0 - 1.2 mg/dL   Alkaline Phosphatase 103 44 - 121 IU/L   AST 15 0 - 40 IU/L   ALT 12 0 - 32 IU/L  Lipid Panel w/o Chol/HDL Ratio     Status: Abnormal   Collection Time: 08/20/22  2:13 PM  Result Value Ref Range   Cholesterol, Total 211 (H) 100 - 199 mg/dL   Triglycerides 72 0 - 149 mg/dL   HDL 59 >62 mg/dL   VLDL Cholesterol Cal 13 5 - 40 mg/dL   LDL Chol Calc (NIH)  139 (H) 0 - 99 mg/dL  Hemoglobin Z6X     Status: None   Collection Time: 08/20/22  2:13 PM  Result Value Ref Range   Hgb A1c MFr Bld 5.6 4.8 - 5.6 %    Comment:          Prediabetes: 5.7 - 6.4          Diabetes: >6.4          Glycemic control for adults with diabetes: <7.0    Est. average glucose Bld gHb Est-mCnc 114 mg/dL  POCT Urinalysis Dipstick ([id number])     Status: Abnormal   Collection Time: 10/15/22  1:42 PM  Result Value Ref Range   Color, UA orange    Clarity, UA cloudy    Glucose, UA Negative Negative   Bilirubin, UA neg    Ketones, UA trace    Spec Grav, UA 1.020 1.010 - 1.025   Blood, UA neg    pH, UA 6.0 5.0 - 8.0   Protein, UA Positive (A) Negative   Urobilinogen, UA 0.2 0.2 or 1.0 E.U./dL   Nitrite, UA neg    Leukocytes, UA Negative Negative   Appearance cloudy    Odor yes       Assessment & Plan:  Covid/Flu/RSV negative. Rest ,fluids, tylenol at home. Problem List Items Addressed This Visit     Chronic kidney disease   Essential hypertension, benign   Relevant Medications   losartan (COZAAR) 50 MG tablet   Low back pain    Relevant Orders   POCT Urinalysis Dipstick (81002) (Completed)   Other Visit Diagnoses     Generalized body aches    -  Primary   Other fatigue       Viral syndrome           Follow up as scheduled- sooner if needed.  Total time spent: 25 minutes  Margaretann Loveless, MD  10/15/2022   This document may have been prepared by [facility] Voice Recognition software and as such may include unintentional dictation errors.

## 2022-10-16 ENCOUNTER — Ambulatory Visit: Payer: Medicare Other | Admitting: Physical Therapy

## 2022-10-16 ENCOUNTER — Encounter: Payer: Self-pay | Admitting: Physical Therapy

## 2022-10-16 VITALS — BP 128/65 | HR 62

## 2022-10-16 DIAGNOSIS — R270 Ataxia, unspecified: Secondary | ICD-10-CM

## 2022-10-16 DIAGNOSIS — R2681 Unsteadiness on feet: Secondary | ICD-10-CM

## 2022-10-16 DIAGNOSIS — R296 Repeated falls: Secondary | ICD-10-CM

## 2022-10-16 NOTE — Therapy (Addendum)
OUTPATIENT PHYSICAL THERAPY TREATMENT   Patient Name: Anna Chambers MRN: [id number] DOB:12-31-1940, 82 y.o., female Today's Date: 10/16/2022  END OF SESSION:  PT End of Session - 10/16/22 1537     Visit Number 2    Number of Visits 16    Date for PT Re-Evaluation 12/05/22    Authorization Type [employer] Medicare reporting period from 10/10/2022    Progress Note Due on Visit 10    PT Start Time 1522    PT Stop Time 1600    PT Time Calculation (min) 38 min    Equipment Utilized During Treatment Gait belt    Activity Tolerance Patient tolerated treatment well;No increased pain    Behavior During Therapy [facility] for tasks assessed/performed             Past Medical History:  Diagnosis Date   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HCC)    Chronic kidney disease 01/22/2019   Depression    Hypertension    Seizure (HCC) 02/25/2017   Sleep apnea    Syncope and collapse 02/25/2017   Past Surgical History:  Procedure Laterality Date   CATARACT EXTRACTION     CATARACT EXTRACTION W/PHACO Left 04/12/2021   Procedure: CATARACT EXTRACTION PHACO AND INTRAOCULAR LENS PLACEMENT (IOC) LEFT 8.12 01:13.1;  Surgeon: Lockie Mola, MD;  Location: [facility];  Service: Ophthalmology;  Laterality: Left;   COLONOSCOPY     COLONOSCOPY WITH PROPOFOL N/A 12/04/2018   Procedure: COLONOSCOPY WITH PROPOFOL;  Surgeon: Toledo, Boykin Nearing, MD;  Location: [facility];  Service: Gastroenterology;  Laterality: N/A;   EYE SURGERY     FOOT SURGERY Left    NECK SURGERY     SPINE SURGERY     ACDF C3-4   VAGINAL HYSTERECTOMY     Patient Active Problem List   Diagnosis Date Noted   Impaired glucose tolerance 05/25/2022   Chronic left shoulder pain 04/05/2022   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   Hypokalemia 02/28/2022   Gout 02/28/2022   Depression 04/17/2019   Displacement of lumbar intervertebral disc without myelopathy 04/17/2019   Glaucoma (increased eye pressure)  04/17/2019   Hyperlipemia 04/17/2019   Lumbar disc disorder with myelopathy 04/17/2019   Osteoporosis, 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   Chronic kidney disease 01/22/2019   Essential hypertension, benign 01/22/2019   Sleep apnea 01/22/2019   History of poliomyelitis 09/24/2018   Hx of adenomatous colonic polyps 09/24/2018   Vitamin B12 deficiency (non anemic) 09/02/2018   Syncope 02/25/2017   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HCC) 01/26/2016   Lumbosacral radiculopathy 01/26/2016   S/P cervical spinal fusion 01/26/2016   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   Obesity (BMI 30-39.9) 12/07/2014   Thoracic or lumbosacral neuritis or radiculitis 12/03/2011   Cervical spondylosis with myelopathy 12/03/2011   Shortness of breath 05/08/2011   DDD (degenerative disc disease), cervical 01/17/2011   Low back pain 01/09/2011   Pain in joints 10/23/2010    PCP:  Anna Loveless, MD  REFERRING PROVIDER: Glendale Chard, DO  REFERRING DIAG: Unsteadiness on feet, cervical myelopathy and gait ataxia   Rationale for Evaluation and Treatment: Rehabilitation  THERAPY DIAG:  Unsteadiness on feet  Repeated falls  Ataxia  ONSET DATE: chronic  PERTINENT HISTORY:  Anna Chambers is an 81yoF who is referred to OPPT by bu neurology for ongoing imbalance and unsteadiness of gait related to cervical myelopathy. Pt also has a history of Left sided foot drop, and recent glaucoma all of which also affect her balance. Pt  is mostly household AMB with SPC, RW or furniture, but goes to grocery weekly with family. Pt has 1 step to enter with railing, then home is all on one level. Pt lives with her husband, son there sometimes, grandduaghters visit often as well. Pt does not use her AFO much since it seems bulky and heavy. She started using a SPC and RW >1 years ago at recommendation of Dr. Allena Katz neurology. Pt reports 2 falls in past year related to tripping over objects she could not see in home. Relevant past  medical history and comorbidities include Fuchs Dystrophy. She has 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HCC); Lumbosacral radiculopathy; S/P cervical spinal fusion; Syncope; Chronic kidney disease; DDD (degenerative disc disease), cervical; Depression; Displacement of lumbar intervertebral disc without myelopathy; Glaucoma (increased eye pressure); History of poliomyelitis; Hx of adenomatous colonic polyps; Hyperlipemia; Essential hypertension, benign; Low back pain; Lumbar disc disorder with myelopathy; Obesity (BMI 30-39.9); Osteoporosis, post-menopausal; Pain in joints; Shortness of breath; Sleep apnea; Thoracic or lumbosacral neuritis or radiculitis; Cervical spondylosis with myelopathy; Vitamin B12 deficiency (non anemic); Arthrodesis status; Hypokalemia; Gout; Chronic left shoulder pain; Adhesive capsulitis of left shoulder; and Impaired glucose tolerance on their problem list. She  has a past medical history of 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HCC), Chronic kidney disease (01/22/2019), Depression, Hypertension, Seizure (HCC) (02/25/2017), Sleep apnea, and Syncope and collapse (02/25/2017). She  has a past surgical history that includes Vaginal hysterectomy; Foot surgery (Left); Spine surgery; Eye surgery; Cataract extraction; Colonoscopy; Neck surgery; Colonoscopy with propofol (N/A, 12/04/2018); and Cataract extraction w/PHACO (Left, 04/12/2021).    SUBJECTIVE:                                                                                                                                                                                           SUBJECTIVE STATEMENT: Pt states she was sore after her initial evaluation in her low back and her arms were stiff but she is feeling better now. She denies falls since last PT session. She arrives with a single point cane. She states her AFO feels too thick and heavy.   PAIN:  NPRS: 0/10, states she is stiff in her left leg.    PRECAUTIONS: Fall   PATIENT GOALS: Pt wants help with balance changes and recent falls   NEXT MD VISIT:   OBJECTIVE:  Vitals:   10/16/22 1532  BP: 128/65  Pulse: 62  SpO2: 100%   IMAGING Cervical spine MRI report from 11/23/2019: EXAM: Magnetic resonance imaging, spinal canal and contents, cervical without contrast material.  DATE: 11/23/2019 11:22 AM  ACCESSION: [id number]  DICTATED: 11/23/2019 11:36 AM  INTERPRETATION LOCATION:  [facility]   CLINICAL INDICATION: 82 years old Female with cervical radiculopathy  - M54.12 - Radiculopathy, cervical region    COMPARISON: Cervical spine radiographs 10/02/2012, cervical spine MRI 03/25/2012   TECHNIQUE: Multiplanar multisequence MRI was performed through the cervical spine without intravenous contrast.   FINDINGS:  Postsurgical changes reflecting C3-C4 ACDF are noted. Alignment is within expected limits. There is degenerative marrow signal abnormality at C7-T1. Marrow signal is otherwise normal.   There is mild atrophy with ill-defined T2 hyperintensity within the cord at the C3-C4 level likely reflecting myelomalacia related to compressive myelopathy seen on the prior study.   There is grade 1 anterolisthesis of C4 on C5, likely degenerative in nature and progressed since 2014. Alignment is otherwise normal. There is multilevel disc desiccation and narrowing, most advanced at C7-T1, progressed since 2014. There is a 4 mm T2 hyperintense lesion anterior to the right C2-C3 articulation which may reflect a small synovial cyst (3-21).   C2-C3: There is a left paracentral disc osteophyte complex, ligamentum flavum thickening, and bilateral facet arthropathy resulting in mild spinal canal stenosis and mild left and no stranding or right neural foraminal stenosis.   C3-C4: There is uncovertebral and facet arthropathy resulting in moderate left worse than right neural foraminal stenosis without significant spinal canal stenosis. The  spinal canal stenosis is significantly improved compared to the preoperative study.   C4-C5: There is grade 1 anterolisthesis of C4 on C5 with a superimposed mild posterior disc osteophyte complex, ligamentum flavum thickening, and bilateral facet arthropathy resulting in mild spinal canal stenosis and moderate right worse than left neural foraminal stenosis. Findings are progressed in the interim.   C5-C6: There is a prominent posterior disc osteophyte complex, and mentum flavum thickening, and bilateral facet arthropathy resulting in mild spinal canal stenosis with effacement of the ventral thecal sac and severe bilateral neural foraminal stenosis. Findings are progressed in the interim.   C6-C7: There is a posterior disc osteophyte complex, ligament flavum thickening, and bilateral facet arthropathy resulting in mild spinal canal stenosis with effacement of the ventral thecal sac and severe bilateral neural foraminal stenosis. Findings are progressed in the interim.   C7-T1: There is a posterior disc osteophyte complex, ligamentum flavum thickening, and bilateral facet arthropathy resulting in mild spinal canal stenosis with effacement of the ventral thecal sac and severe bilateral neural foraminal stenosis. Findings are similar to the prior study.   T1-T2: No high-grade spinal canal or neural foraminal stenosis.   The paraspinal tissues are within normal limits.   IMPRESSION:  Status post C3-C4 ACDF with resolved spinal canal stenosis at this level compared to the preoperative study of 2014. Mild atrophy and persistent ill-defined T2 hyperintensity within the cord at this level likely reflects myomalacia.   Multilevel degenerative changes throughout the remainder of the the cervical spine detailed above are overall slightly progressed since 2014, with mild spinal canal stenosis and severe bilateral neural foraminal stenosis at C5-C6 through C7-T1.    TODAY'S TREATMENT:       Therapeutic  exercise: to centralize symptoms and improve ROM, strength, muscular endurance, and activity tolerance required for successful completion of functional activities.  - vitals check for safety screen (see above).  - sit <> stand from 18 inch chair, hands crossed over chest, 3x10.  - standing hip abduction AROM with B UE support on TM bar, 3x10 each side. Cuing to stand up tall and to keep R knee slightly flexed in standing to prevent hyperextension and damage to  the joint.  -  Education on HEP including handout   Neuromuscular Re-education: to improve, balance, postural strength, muscle activation patterns, and stabilization strength required for functional activities: - corner balance with chair in front: narrow stance, eyes open, head turns 1x20 each direction.  - corner balance with chair in front: narrow stance, eyes closed, 2x1 minute  Pt required multimodal cuing for proper technique and to facilitate improved neuromuscular control, strength, range of motion, and functional ability resulting in improved performance and form.   PATIENT EDUCATION:  Education details: Exercise purpose/form. Self management techniques. Education on HEP including handout. Person educated: Patient Education method: Explanation, Demonstration, Tactile cues, and Handouts Education comprehension: verbalized understanding, returned demonstration, and needs further education  HOME EXERCISE PROGRAM: Access Code: [email or web address] URL: [email or web address] Date: 10/16/2022 Prepared by: Norton Blizzard  Exercises - Sit to Stand with Arms Crossed  - 1 x daily - 3 sets - 10 reps - Corner Balance Feet Together With Eyes Closed  - 1 x daily - 3 sets - 1 min hold - Standing Hip Abduction with Counter Support  - 1 x daily - 3 sets - 10 reps - 1 seconds hold  ASSESSMENT:  CLINICAL IMPRESSION: Patient arrives reporting slight soreness from initial evaluation. Today's session focused on initiating strength and  balance exercises. Patient noted to be locking her right knee into hyperextension and was educated on dangers of this excessive abnormal stress on the joint and how to use slight flexion to avoid this position. Patient demonstrated sufficient quad strength to maintain slight R knee flexion in single leg stance. She also demonstrated compensated trendelenburg when attempting single leg stand on L LE. Plan to perform LE MMT next visit to further elucidate LE strength impairments that are likely contributing to imbalance and gait abnormalities. Patient appeared to understand and feel comfortable with initial HEP prescribed today. She reported feeling no worse by end of session. Patient would benefit from continued management of limiting condition by skilled physical therapist to address remaining impairments and functional limitations to work towards stated goals and return to PLOF or maximal functional independence.   From initial PT evaluation on 10/10/2022:  Pt referred to OPPT for gait ataxia and imbalance. Objective tests and measures take place this date, reveal impaired power in 5xSTS, elevated risk of falls correlated with results of TUG, , and BBT. Pt is motivated to remain funcitonal and avoid future falls. Pt will benefit from skilled PT intervention to reduce falls risk and improve th eability to safely participate in IADL and social activity.   OBJECTIVE IMPAIRMENTS: impaired power in 5xSTS, elevated risk of falls correlated with results of TUG, , and BBT. Muscle performance (strength/power/endurance), balance, motor control, gait.   ACTIVITY LIMITATIONS: Carry;Reach Overhead;Locomotion Level;Stairs;Stand   PARTICIPATION LIMITATIONS: Medication Management;Laundry;Yard Work;Community Activity   PERSONAL FACTORS: Age;Behavior Pattern are also affecting patient's functional outcome.   REHAB POTENTIAL: Good  CLINICAL DECISION MAKING: Evolving/moderate complexity  EVALUATION COMPLEXITY:  Moderate   GOALS: Goals reviewed with patient? No   PT Short Term Goals - Target date 11/07/2022      PT SHORT TERM GOAL #1   Title Pt to report compliance and understanding of home based HEP for balance.    Baseline 10/10/22: initial HEP to be provided in future as appropriate. 10/16/22: Initial HEP provided visit #2.    Time 4    Period Weeks    Status In-Progress     PT SHORT TERM GOAL #2   Title Pt to improve  FOTO survey score >35 points to indicate improved confidence in balance.    Baseline 10/10/22: 56.   Time 4    Period Weeks    Status In-progress              PT Long Term Goals - Target date 12/10/2022 for all long term goals.       PT LONG TERM GOAL #1   Title Pt to improve Ber Balance Test Score >10 point to indicate reduceds falls risk.    Baseline 10/10/22: 43.   Time 2    Period Months    Status In-progress     PT LONG TERM GOAL #2   Title Pt to improve Timed up and go test >4 seocnds to indicate reduced falls risk.    Baseline 10/10/22: 14.20sec, SPC.    Time 2    Period Months    Status New      PT LONG TERM GOAL #3   Title Pt to improve to >1.64m/s to indidcated improved ability to navigate the community for IADL.    Baseline 10/10/22: 0.59m/s c SPC.   Time 2    Period Months    Status In-progress         PLAN:  PT FREQUENCY: 2x/week  PT DURATION: 8 weeks  PLANNED INTERVENTIONS:  ADLs/Self Care Home Management;Aquatic Therapy;Biofeedback;Electrical Stimulation;DME Instruction;Gait training;Stair training;Functional mobility training;Therapeutic activities;Therapeutic exercise;Balance training;Neuromuscular re-education;Patient/family education;Orthotic Fit/Training;Manual techniques;Passive range of motion;Dry needling;Energy conservation;Taping;Visual/perceptual remediation/compensation.  PLAN FOR NEXT SESSION: Update HEP as appropriate, progressive LE/functional strengthening and balance exercises as appropriate, education.    Cira Rue, PT, DPT 10/16/2022, 4:31 PM   [facility] [address] [place], [place] [address] P: [phone] I F: [phone]  Addendum to correct title of note.  Luretha Murphy. Ilsa Iha, PT, DPT 10/30/22, 4:37 PM

## 2022-10-22 ENCOUNTER — Encounter: Payer: Self-pay | Admitting: Physical Therapy

## 2022-10-22 ENCOUNTER — Ambulatory Visit: Payer: Medicare Other | Admitting: Physical Therapy

## 2022-10-22 DIAGNOSIS — R2681 Unsteadiness on feet: Secondary | ICD-10-CM | POA: Diagnosis not present

## 2022-10-22 DIAGNOSIS — R270 Ataxia, unspecified: Secondary | ICD-10-CM

## 2022-10-22 DIAGNOSIS — R296 Repeated falls: Secondary | ICD-10-CM
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
## 2022-10-24 ENCOUNTER — Ambulatory Visit: Payer: Medicare Other | Admitting: Physical Therapy

## 2022-10-25 ENCOUNTER — Encounter: Payer: Self-pay | Admitting: Physical Therapy

## 2022-10-25 ENCOUNTER — Ambulatory Visit: Payer: Medicare Other | Admitting: Physical Therapy

## 2022-10-25 DIAGNOSIS — R2681 Unsteadiness on feet: Secondary | ICD-10-CM

## 2022-10-25 DIAGNOSIS — R296 Repeated falls: Secondary | ICD-10-CM

## 2022-10-25 DIAGNOSIS — R270 Ataxia, unspecified: Secondary | ICD-10-CM
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
## 2022-10-29 ENCOUNTER — Ambulatory Visit: Payer: Medicare Other | Admitting: Physical Therapy

## 2022-10-29 ENCOUNTER — Encounter: Payer: Self-pay | Admitting: Physical Therapy

## 2022-10-29 DIAGNOSIS — R2681 Unsteadiness on feet: Secondary | ICD-10-CM | POA: Diagnosis not present

## 2022-10-29 DIAGNOSIS — R296 Repeated falls: Secondary | ICD-10-CM

## 2022-10-29 DIAGNOSIS — R270 Ataxia, unspecified: Secondary | ICD-10-CM
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
## 2022-10-31 ENCOUNTER — Encounter: Payer: Self-pay | Admitting: Physical Therapy

## 2022-10-31 ENCOUNTER — Ambulatory Visit: Payer: Medicare Other | Admitting: Physical Therapy

## 2022-10-31 DIAGNOSIS — R2681 Unsteadiness on feet: Secondary | ICD-10-CM | POA: Diagnosis not present

## 2022-10-31 DIAGNOSIS — R270 Ataxia, unspecified: Secondary | ICD-10-CM

## 2022-10-31 DIAGNOSIS — R296 Repeated falls: Secondary | ICD-10-CM
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
## 2022-11-05 ENCOUNTER — Ambulatory Visit: Payer: Medicare Other | Admitting: Physical Therapy

## 2022-11-05 ENCOUNTER — Encounter: Payer: Self-pay | Admitting: Physical Therapy

## 2022-11-05 DIAGNOSIS — R296 Repeated falls: Secondary | ICD-10-CM

## 2022-11-05 DIAGNOSIS — R270 Ataxia, unspecified: Secondary | ICD-10-CM

## 2022-11-05 DIAGNOSIS — R2681 Unsteadiness on feet: Secondary | ICD-10-CM | POA: Diagnosis not present
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
## 2022-11-07 ENCOUNTER — Telehealth: Payer: Self-pay

## 2022-11-07 ENCOUNTER — Ambulatory Visit: Payer: Medicare Other | Attending: Neurology | Admitting: Physical Therapy

## 2022-11-07 ENCOUNTER — Encounter: Payer: Self-pay | Admitting: Physical Therapy

## 2022-11-07 DIAGNOSIS — R296 Repeated falls: Secondary | ICD-10-CM

## 2022-11-07 DIAGNOSIS — R2681 Unsteadiness on feet: Secondary | ICD-10-CM | POA: Diagnosis present

## 2022-11-07 DIAGNOSIS — R270 Ataxia, unspecified: Secondary | ICD-10-CM
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
## 2022-11-07 NOTE — Patient Outreach (Signed)
Care Coordination   Initial Visit Note   11/07/2022 Name: CARRIANN JERZAK MRN: [id number] DOB: 1940-09-27  ANELIS WAVRA is a 82 y.o. year old female who sees Margaretann Loveless, MD for primary care. I spoke with  Threasa Heads by phone today.  What matters to the patients health and wellness today?  Patient denies having any nursing and / or community resource needs.      Goals Addressed             This Visit's Progress    COMPLETED: Care coordination activities - no follow up needed.       Interventions Today    Flowsheet Row Most Recent Value  General Interventions   General Interventions Discussed/Reviewed General Interventions Discussed  [Care coordination services discussed. SDOH survey completed. AWV discussed and patient advised to contact provider office to schedule. Discussed vaccines. Advised to contact PCP office if care coordination services needed in the future.]               SDOH assessments and interventions completed:  Yes  SDOH Interventions Today    Flowsheet Row Most Recent Value  SDOH Interventions   Food Insecurity Interventions Intervention Not Indicated  Housing Interventions Intervention Not Indicated  Transportation Interventions Intervention Not Indicated        Care Coordination Interventions:  Yes, provided   Follow up plan: No further intervention required.   Encounter Outcome:  Patient Visit Completed   [facility] Care Coordination [phone] direct line

## 2022-11-12 ENCOUNTER — Ambulatory Visit (INDEPENDENT_AMBULATORY_CARE_PROVIDER_SITE_OTHER): Payer: Medicare Other | Admitting: Internal Medicine

## 2022-11-12 ENCOUNTER — Encounter: Payer: Self-pay | Admitting: Physical Therapy

## 2022-11-12 ENCOUNTER — Ambulatory Visit: Payer: Medicare Other | Admitting: Physical Therapy

## 2022-11-12 ENCOUNTER — Ambulatory Visit: Payer: Medicare Other | Admitting: Cardiovascular Disease

## 2022-11-12 ENCOUNTER — Encounter: Payer: Self-pay | Admitting: Internal Medicine

## 2022-11-12 VITALS — BP 100/70 | HR 51 | Ht 64.0 in | Wt 145.6 lb

## 2022-11-12 DIAGNOSIS — Z23 Encounter for immunization: Secondary | ICD-10-CM | POA: Diagnosis not present

## 2022-11-12 DIAGNOSIS — M1A39X0 Chronic gout due to renal impairment, multiple sites, without tophus (tophi): Secondary | ICD-10-CM

## 2022-11-12 DIAGNOSIS — R270 Ataxia, unspecified: Secondary | ICD-10-CM

## 2022-11-12 DIAGNOSIS — E782 Mixed hyperlipidemia: Secondary | ICD-10-CM

## 2022-11-12 DIAGNOSIS — R7303 Prediabetes: Secondary | ICD-10-CM

## 2022-11-12 DIAGNOSIS — N1831 Chronic kidney disease, stage 3a: Secondary | ICD-10-CM | POA: Diagnosis not present

## 2022-11-12 DIAGNOSIS — R2681 Unsteadiness on feet: Secondary | ICD-10-CM | POA: Diagnosis not present

## 2022-11-12 DIAGNOSIS — Z1231 Encounter for screening mammogram for malignant neoplasm of breast: Secondary | ICD-10-CM

## 2022-11-12 DIAGNOSIS — I10 Essential (primary) hypertension: Secondary | ICD-10-CM | POA: Diagnosis not present

## 2022-11-12 DIAGNOSIS — R296 Repeated falls: Secondary | ICD-10-CM

## 2022-11-12 NOTE — Progress Notes (Signed)
Established Patient Office Visit  Subjective:  Patient ID: Anna Chambers, female    DOB: 09-06-1940  Age: 82 y.o. MRN: [id number]  Chief Complaint  Patient presents with   Follow-up    2 month follow up    Patient comes in for her follow-up today.  She is in good spirits and is actually feeling well.  Blood pressure is looking much better now and she is still on losartan 50 mg along with other medications has no new complaints. Needs to be scheduled for mammogram which is due in November. Patient will return fasting for blood work next week. Flu vaccine today.     No other concerns at this time.   Past Medical History:  Diagnosis Date   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HCC)    Chronic kidney disease 01/22/2019   Depression    Hypertension    Seizure (HCC) 02/25/2017   Sleep apnea    Syncope and collapse 02/25/2017    Past Surgical History:  Procedure Laterality Date   CATARACT EXTRACTION     CATARACT EXTRACTION W/PHACO Left 04/12/2021   Procedure: CATARACT EXTRACTION PHACO AND INTRAOCULAR LENS PLACEMENT (IOC) LEFT 8.12 01:13.1;  Surgeon: Lockie Mola, MD;  Location: [facility];  Service: Ophthalmology;  Laterality: Left;   COLONOSCOPY     COLONOSCOPY WITH PROPOFOL N/A 12/04/2018   Procedure: COLONOSCOPY WITH PROPOFOL;  Surgeon: Toledo, Boykin Nearing, MD;  Location: [facility];  Service: Gastroenterology;  Laterality: N/A;   EYE SURGERY     FOOT SURGERY Left    NECK SURGERY     SPINE SURGERY     ACDF C3-4   VAGINAL HYSTERECTOMY      Social History   Socioeconomic History   Marital status: Married    Spouse name: Not on file   Number of children: 1   Years of education: Not on file   Highest education level: Not on file  Occupational History   Occupation: retired  Tobacco Use   Smoking status: Never   Smokeless tobacco: Never  Vaping Use   Vaping status: Never Used  Substance and Sexual Activity   Alcohol use: No     Alcohol/week: 0.0 standard drinks of alcohol   Drug use: No   Sexual activity: Not on file  Other Topics Concern   Not on file  Social History Narrative   Lives with husband in a one story home.  Has one child.     Retired from  [employer] and [employer].     Right handed   Social Determinants of Health   Financial Resource Strain: Not on file  Food Insecurity: No Food Insecurity (11/07/2022)   Hunger Vital Sign    Worried About Running Out of Food in the Last Year: Never true    Ran Out of Food in the Last Year: Never true  Transportation Needs: No Transportation Needs (11/07/2022)   PRAPARE - Administrator, Civil Service (Medical): No    Lack of Transportation (Non-Medical): No  Physical Activity: Not on file  Stress: Not on file  Social Connections: Not on file  Intimate Partner Violence: Not At Risk (03/01/2022)   Humiliation, Afraid, Rape, and Kick questionnaire    Fear of Current or Ex-Partner: No    Emotionally Abused: No    Physically Abused: No    Sexually Abused: No    Family History  Problem Relation Age of Onset   Hypertension Mother    Kidney failure Mother  Heart disease Mother    Hypertension Father    Cancer Father    Hypertension Sister    Hypertension Sister    Healthy Son    Breast cancer Neg Hx     Allergies  Allergen Reactions   Simvastatin Rash    Review of Systems  Constitutional: Negative.   HENT: Negative.    Eyes: Negative.   Respiratory: Negative.  Negative for cough and shortness of breath.   Cardiovascular: Negative.  Negative for chest pain, palpitations and leg swelling.  Gastrointestinal: Negative.  Negative for abdominal pain, constipation, diarrhea, heartburn, nausea and vomiting.  Genitourinary: Negative.  Negative for dysuria and flank pain.  Musculoskeletal: Negative.  Negative for joint pain and myalgias.  Skin: Negative.   Neurological: Negative.  Negative for dizziness and headaches.  Endo/Heme/Allergies: Negative.    Psychiatric/Behavioral: Negative.  Negative for depression and suicidal ideas. The patient is not nervous/anxious.        Objective:   BP 100/70   Pulse (!) 51   Ht 5\' 4"  (1.626 m)   Wt 145 lb 9.6 oz (66 kg)   LMP  (LMP Unknown)   SpO2 98%   BMI 24.99 kg/m   Vitals:   11/12/22 1001  BP: 100/70  Pulse: (!) 51  Height: 5\' 4"  (1.626 m)  Weight: 145 lb 9.6 oz (66 kg)  SpO2: 98%  BMI (Calculated): 24.98    Physical Exam Vitals and nursing note reviewed.  Constitutional:      Appearance: Normal appearance.  HENT:     Head: Normocephalic and atraumatic.     Nose: Nose normal.     Mouth/Throat:     Mouth: Mucous membranes are moist.     Pharynx: Oropharynx is clear.  Eyes:     Conjunctiva/sclera: Conjunctivae normal.     Pupils: Pupils are equal, round, and reactive to light.  Cardiovascular:     Rate and Rhythm: Normal rate and regular rhythm.     Pulses: Normal pulses.     Heart sounds: Normal heart sounds. No murmur heard. Pulmonary:     Effort: Pulmonary effort is normal.     Breath sounds: Normal breath sounds. No wheezing.  Abdominal:     General: Bowel sounds are normal.     Palpations: Abdomen is soft.     Tenderness: There is no abdominal tenderness. There is no right CVA tenderness or left CVA tenderness.  Musculoskeletal:        General: Normal range of motion.     Cervical back: Normal range of motion.     Right lower leg: No edema.     Left lower leg: No edema.  Skin:    General: Skin is warm and dry.  Neurological:     General: No focal deficit present.     Mental Status: She is alert and oriented to person, place, and time.  Psychiatric:        Mood and Affect: Mood normal.        Behavior: Behavior normal.      No results found for any visits on 11/12/22.      Assessment & Plan:  Fasting labs next week.  Schedule mammogram. Flu shot today. Continue all medications. Problem List Items Addressed This Visit     Chronic kidney  disease   Relevant Orders   CBC with Diff   Hyperlipemia   Relevant Orders   Lipid Panel w/o Chol/HDL Ratio   Essential hypertension, benign - Primary   Relevant  Orders   CMP14+EGFR   Gout   Relevant Orders   Uric acid   CBC with Diff   Other Visit Diagnoses     Breast cancer screening by mammogram       Relevant Orders   MM 3D SCREENING MAMMOGRAM BILATERAL BREAST   Need for immunization against influenza       Relevant Orders   Flu Vaccine Trivalent High Dose (Fluad) (Completed)   Prediabetes       Relevant Orders   Hemoglobin A1c       Follow up 3 months.  Total time spent: 30 minutes  Margaretann Loveless, MD  11/12/2022   This document may have been prepared by [facility] Voice Recognition software and as such may include unintentional dictation errors.
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
## 2022-11-14 ENCOUNTER — Ambulatory Visit: Payer: Medicare Other | Admitting: Physical Therapy

## 2022-11-14 ENCOUNTER — Encounter: Payer: Self-pay | Admitting: Physical Therapy

## 2022-11-14 DIAGNOSIS — R270 Ataxia, unspecified: Secondary | ICD-10-CM

## 2022-11-14 DIAGNOSIS — R296 Repeated falls: Secondary | ICD-10-CM

## 2022-11-14 DIAGNOSIS — R2681 Unsteadiness on feet: Secondary | ICD-10-CM
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
## 2022-11-19 ENCOUNTER — Encounter: Payer: Self-pay | Admitting: Physical Therapy

## 2022-11-19 ENCOUNTER — Ambulatory Visit: Payer: Medicare Other | Admitting: Physical Therapy

## 2022-11-19 DIAGNOSIS — R2681 Unsteadiness on feet: Secondary | ICD-10-CM | POA: Diagnosis not present

## 2022-11-19 DIAGNOSIS — R270 Ataxia, unspecified: Secondary | ICD-10-CM

## 2022-11-19 DIAGNOSIS — R296 Repeated falls: Secondary | ICD-10-CM
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
## 2022-11-21 ENCOUNTER — Encounter: Payer: Self-pay | Admitting: Physical Therapy

## 2022-11-21 ENCOUNTER — Ambulatory Visit: Payer: Medicare Other | Admitting: Physical Therapy

## 2022-11-21 DIAGNOSIS — R296 Repeated falls: Secondary | ICD-10-CM

## 2022-11-21 DIAGNOSIS — R2681 Unsteadiness on feet: Secondary | ICD-10-CM | POA: Diagnosis not present

## 2022-11-21 DIAGNOSIS — R270 Ataxia, unspecified: Secondary | ICD-10-CM
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
## 2022-11-23 ENCOUNTER — Ambulatory Visit (INDEPENDENT_AMBULATORY_CARE_PROVIDER_SITE_OTHER): Payer: Medicare Other | Admitting: Cardiology

## 2022-11-23 ENCOUNTER — Encounter: Payer: Self-pay | Admitting: Cardiology

## 2022-11-23 VITALS — BP 140/80 | HR 74 | Ht 64.0 in | Wt 145.8 lb

## 2022-11-23 DIAGNOSIS — M25469 Effusion, unspecified knee: Secondary | ICD-10-CM | POA: Diagnosis not present

## 2022-11-23 DIAGNOSIS — M545 Low back pain, unspecified: Secondary | ICD-10-CM | POA: Diagnosis not present

## 2022-11-23 DIAGNOSIS — M5126 Other intervertebral disc displacement, lumbar region: Secondary | ICD-10-CM | POA: Diagnosis not present

## 2022-11-23 DIAGNOSIS — I10 Essential (primary) hypertension: Secondary | ICD-10-CM

## 2022-11-23 MED ORDER — BACLOFEN 10 MG PO TABS: 10.0000 mg | ORAL_TABLET | Freq: Every day | ORAL | 0 refills | Status: DC

## 2022-11-23 MED ORDER — MELOXICAM 7.5 MG PO TABS: 7.5000 mg | ORAL_TABLET | Freq: Every day | ORAL | 2 refills | Status: DC

## 2022-11-23 NOTE — Progress Notes (Signed)
Established Patient Office Visit  Subjective:  Patient ID: Anna Chambers, female    DOB: [birth year]  Age: 82 y.o. MRN: [id number]  Chief Complaint  Patient presents with   Follow-up    Swelling in knees and back pain    Patient in office complaining of swelling and pain in the left knee and lumbar back pain. Knee pain started Wednesday. Patient denies any trauma to the knee. Back pain started when she started PT. Patient has Baclofen on her medication list but denies taking it. Will send in a refill. Will send in Mobic for left knee edema and pain. Patient reports having 3 PT sessions remaining. Will follow up with regular physician in 4 weeks.     No other concerns at this time.   Past Medical History:  Diagnosis Date   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HCC)    Chronic kidney disease 01/22/2019   Depression    Hypertension    Seizure (HCC) 02/25/2017   Sleep apnea    Syncope and collapse 02/25/2017    Past Surgical History:  Procedure Laterality Date   CATARACT EXTRACTION     CATARACT EXTRACTION W/PHACO Left 04/12/2021   Procedure: CATARACT EXTRACTION PHACO AND INTRAOCULAR LENS PLACEMENT (IOC) LEFT 8.12 01:13.1;  Surgeon: Lockie Mola, MD;  Location: [facility];  Service: Ophthalmology;  Laterality: Left;   COLONOSCOPY     COLONOSCOPY WITH PROPOFOL N/A 12/04/2018   Procedure: COLONOSCOPY WITH PROPOFOL;  Surgeon: Toledo, Boykin Nearing, MD;  Location: [facility];  Service: Gastroenterology;  Laterality: N/A;   EYE SURGERY     FOOT SURGERY Left    NECK SURGERY     SPINE SURGERY     ACDF C3-4   VAGINAL HYSTERECTOMY      Social History   Socioeconomic History   Marital status: Married    Spouse name: Not on file   Number of children: 1   Years of education: Not on file   Highest education level: Not on file  Occupational History   Occupation: retired  Tobacco Use   Smoking status: Never   Smokeless tobacco: Never  Vaping Use    Vaping status: Never Used  Substance and Sexual Activity   Alcohol use: No    Alcohol/week: 0.0 standard drinks of alcohol   Drug use: No   Sexual activity: Not on file  Other Topics Concern   Not on file  Social History Narrative   Lives with husband in a one story home.  Has one child.     Retired from  [employer] and [employer].     Right handed   Social Determinants of Health   Financial Resource Strain: Not on file  Food Insecurity: No Food Insecurity (11/07/2022)   Hunger Vital Sign    Worried About Running Out of Food in the Last Year: Never true    Ran Out of Food in the Last Year: Never true  Transportation Needs: No Transportation Needs (11/07/2022)   PRAPARE - Administrator, Civil Service (Medical): No    Lack of Transportation (Non-Medical): No  Physical Activity: Not on file  Stress: Not on file  Social Connections: Not on file  Intimate Partner Violence: Not At Risk (03/01/2022)   Humiliation, Afraid, Rape, and Kick questionnaire    Fear of Current or Ex-Partner: No    Emotionally Abused: No    Physically Abused: No    Sexually Abused: No    Family History  Problem Relation Age  of Onset   Hypertension Mother    Kidney failure Mother    Heart disease Mother    Hypertension Father    Cancer Father    Hypertension Sister    Hypertension Sister    Healthy Son    Breast cancer Neg Hx     Allergies  Allergen Reactions   Simvastatin Rash    Review of Systems  Constitutional: Negative.   HENT: Negative.    Eyes: Negative.   Respiratory: Negative.  Negative for shortness of breath.   Cardiovascular: Negative.  Negative for chest pain.  Gastrointestinal: Negative.  Negative for abdominal pain, constipation and diarrhea.  Genitourinary: Negative.   Musculoskeletal:  Positive for back pain. Negative for joint pain and myalgias.  Skin: Negative.   Neurological: Negative.  Negative for dizziness and headaches.  Endo/Heme/Allergies: Negative.   All other  systems reviewed and are negative.      Objective:   BP (!) 140/80   Pulse 74   Ht 5\' 4"  (1.626 m)   Wt 145 lb 12.8 oz (66.1 kg)   LMP  (LMP Unknown)   SpO2 98%   BMI 25.03 kg/m   Vitals:   11/23/22 1128  BP: (!) 140/80  Pulse: 74  Height: 5\' 4"  (1.626 m)  Weight: 145 lb 12.8 oz (66.1 kg)  SpO2: 98%  BMI (Calculated): 25.01    Physical Exam Vitals and nursing note reviewed.  Constitutional:      Appearance: Normal appearance. She is normal weight.  HENT:     Head: Normocephalic and atraumatic.     Nose: Nose normal.     Mouth/Throat:     Mouth: Mucous membranes are moist.  Eyes:     Extraocular Movements: Extraocular movements intact.     Conjunctiva/sclera: Conjunctivae normal.     Pupils: Pupils are equal, round, and reactive to light.  Cardiovascular:     Rate and Rhythm: Normal rate and regular rhythm.     Pulses: Normal pulses.     Heart sounds: Normal heart sounds.  Pulmonary:     Effort: Pulmonary effort is normal.     Breath sounds: Normal breath sounds.  Abdominal:     General: Abdomen is flat. Bowel sounds are normal.     Palpations: Abdomen is soft.  Musculoskeletal:        General: Normal range of motion.     Cervical back: Normal range of motion.     Left knee: Swelling present.  Skin:    General: Skin is warm and dry.  Neurological:     General: No focal deficit present.     Mental Status: She is alert and oriented to person, place, and time.  Psychiatric:        Mood and Affect: Mood normal.        Behavior: Behavior normal.        Thought Content: Thought content normal.        Judgment: Judgment normal.      No results found for any visits on 11/23/22.  Recent Results (from the past 2160 hour(s))  POCT Urinalysis Dipstick ([address])     Status: Abnormal   Collection Time: 10/15/22  1:42 PM  Result Value Ref Range   Color, UA orange    Clarity, UA cloudy    Glucose, UA Negative Negative   Bilirubin, UA neg    Ketones, UA trace     Spec Grav, UA 1.020 1.010 - 1.025   Blood, UA neg  pH, UA 6.0 5.0 - 8.0   Protein, UA Positive (A) Negative   Urobilinogen, UA 0.2 0.2 or 1.0 E.U./dL   Nitrite, UA neg    Leukocytes, UA Negative Negative   Appearance cloudy    Odor yes   POCT XPERT XPRESS SARS COVID-2/FLU/RSV     Status: None   Collection Time: 10/15/22  2:37 PM  Result Value Ref Range   SARS Coronavirus 2 neg    FLU A neg    FLU B neg    RSV RNA, PCR neg       Assessment & Plan:  Baclofen refilled.  Mobic for knee pain and edema.   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   Total time spent: 25 minutes  [employer], NP  11/23/2022   This document may have been prepared by Dragon Voice Recognition software and as such may include unintentional dictation errors.

## 2022-11-24 LAB — CBC WITH DIFFERENTIAL/PLATELET
Basophils Absolute: 0 10*3/uL (ref 0.0–0.2)
Basos: 0 %
EOS (ABSOLUTE): 0.3 10*3/uL (ref 0.0–0.4)
Eos: 4 %
Hematocrit: 35.6 % (ref 34.0–46.6)
Hemoglobin: 11.3 g/dL (ref 11.1–15.9)
Immature Grans (Abs): 0 10*3/uL (ref 0.0–0.1)
Immature Granulocytes: 0 %
Lymphocytes Absolute: 2.2 10*3/uL (ref 0.7–3.1)
Lymphs: 35 %
MCH: 26.7 pg (ref 26.6–33.0)
MCHC: 31.7 g/dL (ref 31.5–35.7)
MCV: 84 fL (ref 79–97)
Monocytes Absolute: 0.5 10*3/uL (ref 0.1–0.9)
Monocytes: 9 %
Neutrophils Absolute: 3.3 10*3/uL (ref 1.4–7.0)
Neutrophils: 52 %
Platelets: 296 10*3/uL (ref 150–450)
RBC: 4.24 x10E6/uL (ref 3.77–5.28)
RDW: 14.5 % (ref 11.7–15.4)
WBC: 6.4 10*3/uL (ref 3.4–10.8)

## 2022-11-24 LAB — LIPID PANEL W/O CHOL/HDL RATIO
Cholesterol, Total: 206 mg/dL — ABNORMAL HIGH (ref 100–199)
HDL: 68 mg/dL (ref >39)
LDL Chol Calc (NIH): 124 mg/dL — ABNORMAL HIGH (ref 0–99)
Triglycerides: 80 mg/dL (ref 0–149)
VLDL Cholesterol Cal: 14 mg/dL (ref 5–40)

## 2022-11-24 LAB — CMP14+EGFR
ALT: 13 [IU]/L (ref 0–32)
AST: 18 [IU]/L (ref 0–40)
Albumin: 4.2 g/dL (ref 3.7–4.7)
Alkaline Phosphatase: 108 [IU]/L (ref 44–121)
BUN/Creatinine Ratio: 8 — ABNORMAL LOW (ref 12–28)
BUN: 9 mg/dL (ref 8–27)
Bilirubin Total: 0.7 mg/dL (ref 0.0–1.2)
CO2: 23 mmol/L (ref 20–29)
Calcium: 9.4 mg/dL (ref 8.7–10.3)
Chloride: 107 mmol/L — ABNORMAL HIGH (ref 96–106)
Creatinine, Ser: 1.08 mg/dL — ABNORMAL HIGH (ref 0.57–1.00)
Globulin, Total: 2.3 g/dL (ref 1.5–4.5)
Glucose: 92 mg/dL (ref 70–99)
Potassium: 4 mmol/L (ref 3.5–5.2)
Sodium: 145 mmol/L — ABNORMAL HIGH (ref 134–144)
Total Protein: 6.5 g/dL (ref 6.0–8.5)
eGFR: 51 mL/min/{1.73_m2} — ABNORMAL LOW (ref >59)

## 2022-11-24 LAB — HEMOGLOBIN A1C
Est. average glucose Bld gHb Est-mCnc: 114 mg/dL
Hgb A1c MFr Bld: 5.6 % (ref 4.8–5.6)

## 2022-11-24 LAB — URIC ACID: Uric Acid: 4.1 mg/dL (ref 3.1–7.9)

## 2022-11-26 ENCOUNTER — Ambulatory Visit: Payer: Medicare Other | Admitting: Physical Therapy

## 2022-11-26 ENCOUNTER — Encounter: Payer: Self-pay | Admitting: Physical Therapy

## 2022-11-26 DIAGNOSIS — R2681 Unsteadiness on feet: Secondary | ICD-10-CM | POA: Diagnosis not present

## 2022-11-26 DIAGNOSIS — R296 Repeated falls: Secondary | ICD-10-CM

## 2022-11-26 DIAGNOSIS — R270 Ataxia, unspecified: Secondary | ICD-10-CM
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
## 2022-11-28 ENCOUNTER — Encounter: Payer: Self-pay | Admitting: Physical Therapy

## 2022-11-28 ENCOUNTER — Ambulatory Visit: Payer: Medicare Other | Admitting: Physical Therapy

## 2022-11-28 DIAGNOSIS — R2681 Unsteadiness on feet: Secondary | ICD-10-CM | POA: Diagnosis not present

## 2022-11-28 DIAGNOSIS — R270 Ataxia, unspecified: Secondary | ICD-10-CM

## 2022-11-28 DIAGNOSIS — R296 Repeated falls: Secondary | ICD-10-CM
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
## 2022-12-03 ENCOUNTER — Encounter: Payer: Medicare Other | Admitting: Physical Therapy

## 2022-12-05 ENCOUNTER — Encounter: Payer: Medicare Other | Admitting: Physical Therapy

## 2022-12-15 ENCOUNTER — Other Ambulatory Visit: Payer: Self-pay | Admitting: Cardiology

## 2022-12-20 ENCOUNTER — Ambulatory Visit
Admission: RE | Admit: 2022-12-20 | Discharge: 2022-12-20 | Disposition: A | Discharge status: Home / Self Care | Payer: Medicare Other | Source: Ambulatory Visit | Attending: Cardiology | Admitting: Cardiology

## 2022-12-20 ENCOUNTER — Encounter: Payer: Self-pay | Admitting: Cardiology

## 2022-12-20 ENCOUNTER — Ambulatory Visit (INDEPENDENT_AMBULATORY_CARE_PROVIDER_SITE_OTHER): Payer: Medicare Other | Admitting: Cardiology

## 2022-12-20 ENCOUNTER — Ambulatory Visit
Admission: RE | Admit: 2022-12-20 | Discharge: 2022-12-20 | Disposition: A | Discharge status: Home / Self Care | Payer: Medicare Other | Attending: Cardiology | Admitting: Cardiology

## 2022-12-20 VITALS — BP 112/71 | HR 67 | Ht 64.0 in | Wt 141.2 lb

## 2022-12-20 DIAGNOSIS — M5417 Radiculopathy, lumbosacral region: Secondary | ICD-10-CM

## 2022-12-20 DIAGNOSIS — M545 Low back pain, unspecified: Secondary | ICD-10-CM | POA: Diagnosis present

## 2022-12-20 DIAGNOSIS — M5106 Intervertebral disc disorders with myelopathy, lumbar region: Secondary | ICD-10-CM

## 2022-12-20 DIAGNOSIS — Z0130 Encounter for examination of blood pressure without abnormal findings: Secondary | ICD-10-CM

## 2022-12-20 MED ORDER — METHYLPREDNISOLONE 4 MG PO TBPK: ORAL_TABLET | ORAL | 0 refills | Status: DC

## 2022-12-20 NOTE — Progress Notes (Signed)
Established Patient Office Visit  Subjective:  Patient ID: Anna Chambers, female    DOB: 1940/10/17  Age: 82 y.o. MRN: [id number]  Chief Complaint  Patient presents with   Back Pain    Pt has a bottle of gabapin that she got from a dr in [place] not on med list    Patient in office for an acute visit, complaining of back pain. Patient has chronic lumbar back pain. Today she is complaining of right lower back pain that started less than a month ago. States the pain feels like a stabbing pain, does not radiate. Will send in medrol dose pack. Will xray for acute changes.   Back Pain This is a chronic problem. The current episode started 1 to 4 weeks ago. The problem occurs constantly. The problem is unchanged. The pain is present in the lumbar spine. The quality of the pain is described as stabbing. The pain does not radiate. The pain is mild. The symptoms are aggravated by bending, position, standing, twisting and sitting. Pertinent negatives include no abdominal pain, chest pain or headaches. Risk factors include lack of exercise. She has tried NSAIDs and analgesics for the symptoms. The treatment provided no relief.    No other concerns at this time.   Past Medical History:  Diagnosis Date   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HCC)    Chronic kidney disease 01/22/2019   Depression    Hypertension    Seizure (HCC) 02/25/2017   Sleep apnea    Syncope and collapse 02/25/2017    Past Surgical History:  Procedure Laterality Date   CATARACT EXTRACTION     CATARACT EXTRACTION W/PHACO Left 04/12/2021   Procedure: CATARACT EXTRACTION PHACO AND INTRAOCULAR LENS PLACEMENT (IOC) LEFT 8.12 01:13.1;  Surgeon: Lockie Mola, MD;  Location: [facility];  Service: Ophthalmology;  Laterality: Left;   COLONOSCOPY     COLONOSCOPY WITH PROPOFOL N/A 12/04/2018   Procedure: COLONOSCOPY WITH PROPOFOL;  Surgeon: Toledo, Boykin Nearing, MD;  Location: [facility];  Service:  Gastroenterology;  Laterality: N/A;   EYE SURGERY     FOOT SURGERY Left    NECK SURGERY     SPINE SURGERY     ACDF C3-4   VAGINAL HYSTERECTOMY      Social History   Socioeconomic History   Marital status: Married    Spouse name: Not on file   Number of children: 1   Years of education: Not on file   Highest education level: Not on file  Occupational History   Occupation: retired  Tobacco Use   Smoking status: Never   Smokeless tobacco: Never  Vaping Use   Vaping status: Never Used  Substance and Sexual Activity   Alcohol use: No    Alcohol/week: 0.0 standard drinks of alcohol   Drug use: No   Sexual activity: Not on file  Other Topics Concern   Not on file  Social History Narrative   Lives with husband in a one story home.  Has one child.     Retired from  [employer] and [employer].     Right handed   Social Determinants of Health   Financial Resource Strain: Not on file  Food Insecurity: No Food Insecurity (11/07/2022)   Hunger Vital Sign    Worried About Running Out of Food in the Last Year: Never true    Ran Out of Food in the Last Year: Never true  Transportation Needs: No Transportation Needs (11/07/2022)   PRAPARE - Transportation  Lack of Transportation (Medical): No    Lack of Transportation (Non-Medical): No  Physical Activity: Not on file  Stress: Not on file  Social Connections: Not on file  Intimate Partner Violence: Not At Risk (03/01/2022)   Humiliation, Afraid, Rape, and Kick questionnaire    Fear of Current or Ex-Partner: No    Emotionally Abused: No    Physically Abused: No    Sexually Abused: No    Family History  Problem Relation Age of Onset   Hypertension Mother    Kidney failure Mother    Heart disease Mother    Hypertension Father    Cancer Father    Hypertension Sister    Hypertension Sister    Healthy Son    Breast cancer Neg Hx     Allergies  Allergen Reactions   Simvastatin Rash    Review of Systems  Constitutional: Negative.    HENT: Negative.    Eyes: Negative.   Respiratory: Negative.  Negative for shortness of breath.   Cardiovascular: Negative.  Negative for chest pain.  Gastrointestinal: Negative.  Negative for abdominal pain, constipation and diarrhea.  Genitourinary: Negative.   Musculoskeletal:  Positive for back pain. Negative for joint pain and myalgias.  Skin: Negative.   Neurological: Negative.  Negative for dizziness and headaches.  Endo/Heme/Allergies: Negative.   All other systems reviewed and are negative.      Objective:   BP 112/71   Pulse 67   Ht 5\' 4"  (1.626 m)   Wt 141 lb 3.2 oz (64 kg)   LMP  (LMP Unknown)   SpO2 98%   BMI 24.24 kg/m   Vitals:   12/20/22 1119  BP: 112/71  Pulse: 67  Height: 5\' 4"  (1.626 m)  Weight: 141 lb 3.2 oz (64 kg)  SpO2: 98%  BMI (Calculated): 24.23    Physical Exam Vitals and nursing note reviewed.  Constitutional:      Appearance: Normal appearance. She is normal weight.  HENT:     Head: Normocephalic and atraumatic.     Nose: Nose normal.     Mouth/Throat:     Mouth: Mucous membranes are moist.  Eyes:     Extraocular Movements: Extraocular movements intact.     Conjunctiva/sclera: Conjunctivae normal.     Pupils: Pupils are equal, round, and reactive to light.  Cardiovascular:     Rate and Rhythm: Normal rate and regular rhythm.     Pulses: Normal pulses.     Heart sounds: Normal heart sounds.  Pulmonary:     Effort: Pulmonary effort is normal.     Breath sounds: Normal breath sounds.  Abdominal:     General: Abdomen is flat. Bowel sounds are normal.     Palpations: Abdomen is soft.  Musculoskeletal:        General: Normal range of motion.     Cervical back: Normal range of motion.  Skin:    General: Skin is warm and dry.  Neurological:     General: No focal deficit present.     Mental Status: She is alert and oriented to person, place, and time.  Psychiatric:        Mood and Affect: Mood normal.        Behavior: Behavior  normal.        Thought Content: Thought content normal.        Judgment: Judgment normal.      No results found for any visits on 12/20/22.  Recent Results (from the past  2160 hour(s))  POCT Urinalysis Dipstick ([address])     Status: Abnormal   Collection Time: 10/15/22  1:42 PM  Result Value Ref Range   Color, UA orange    Clarity, UA cloudy    Glucose, UA Negative Negative   Bilirubin, UA neg    Ketones, UA trace    Spec Grav, UA 1.020 1.010 - 1.025   Blood, UA neg    pH, UA 6.0 5.0 - 8.0   Protein, UA Positive (A) Negative   Urobilinogen, UA 0.2 0.2 or 1.0 E.U./dL   Nitrite, UA neg    Leukocytes, UA Negative Negative   Appearance cloudy    Odor yes   POCT XPERT XPRESS SARS COVID-2/FLU/RSV     Status: None   Collection Time: 10/15/22  2:37 PM  Result Value Ref Range   SARS Coronavirus 2 neg    FLU A neg    FLU B neg    RSV RNA, PCR neg   Uric acid     Status: None   Collection Time: 11/23/22 12:01 PM  Result Value Ref Range   Uric Acid 4.1 3.1 - 7.9 mg/dL    Comment:            Therapeutic target for gout patients: <6.0  CBC with Diff     Status: None   Collection Time: 11/23/22 12:01 PM  Result Value Ref Range   WBC 6.4 3.4 - 10.8 x10E3/uL   RBC 4.24 3.77 - 5.28 x10E6/uL   Hemoglobin 11.3 11.1 - 15.9 g/dL   Hematocrit 60.4 54.0 - 46.6 %   MCV 84 79 - 97 fL   MCH 26.7 26.6 - 33.0 pg   MCHC 31.7 31.5 - 35.7 g/dL   RDW 98.1 19.1 - 47.8 %   Platelets 296 150 - 450 x10E3/uL   Neutrophils 52 Not Estab. %   Lymphs 35 Not Estab. %   Monocytes 9 Not Estab. %   Eos 4 Not Estab. %   Basos 0 Not Estab. %   Neutrophils Absolute 3.3 1.4 - 7.0 x10E3/uL   Lymphocytes Absolute 2.2 0.7 - 3.1 x10E3/uL   Monocytes Absolute 0.5 0.1 - 0.9 x10E3/uL   EOS (ABSOLUTE) 0.3 0.0 - 0.4 x10E3/uL   Basophils Absolute 0.0 0.0 - 0.2 x10E3/uL   Immature Granulocytes 0 Not Estab. %   Immature Grans (Abs) 0.0 0.0 - 0.1 x10E3/uL  CMP14+EGFR     Status: Abnormal   Collection Time: 11/23/22  12:01 PM  Result Value Ref Range   Glucose 92 70 - 99 mg/dL   BUN 9 8 - 27 mg/dL   Creatinine, Ser 2.95 (H) 0.57 - 1.00 mg/dL   eGFR 51 (L) >62 ZH/YQM/5.78   BUN/Creatinine Ratio 8 (L) 12 - 28   Sodium 145 (H) 134 - 144 mmol/L   Potassium 4.0 3.5 - 5.2 mmol/L   Chloride 107 (H) 96 - 106 mmol/L   CO2 23 20 - 29 mmol/L   Calcium 9.4 8.7 - 10.3 mg/dL   Total Protein 6.5 6.0 - 8.5 g/dL   Albumin 4.2 3.7 - 4.7 g/dL   Globulin, Total 2.3 1.5 - 4.5 g/dL   Bilirubin Total 0.7 0.0 - 1.2 mg/dL   Alkaline Phosphatase 108 44 - 121 IU/L   AST 18 0 - 40 IU/L   ALT 13 0 - 32 IU/L  Lipid Panel w/o Chol/HDL Ratio     Status: Abnormal   Collection Time: 11/23/22 12:01 PM  Result Value Ref Range  Cholesterol, Total 206 (H) 100 - 199 mg/dL   Triglycerides 80 0 - 149 mg/dL   HDL 68 >16 mg/dL   VLDL Cholesterol Cal 14 5 - 40 mg/dL   LDL Chol Calc (NIH) 109 (H) 0 - 99 mg/dL  Hemoglobin U0A     Status: None   Collection Time: 11/23/22 12:01 PM  Result Value Ref Range   Hgb A1c MFr Bld 5.6 4.8 - 5.6 %    Comment:          Prediabetes: 5.7 - 6.4          Diabetes: >6.4          Glycemic control for adults with diabetes: <7.0    Est. average glucose Bld gHb Est-mCnc 114 mg/dL      Assessment & Plan:  Xray lumbar back. Medrol dose pack.  Problem List Items Addressed This Visit       Nervous and Auditory   Lumbosacral radiculopathy   Relevant Orders   DG Lumbar Spine Complete   Lumbar disc disorder with myelopathy - Primary   Relevant Orders   DG Lumbar Spine Complete     Other   Low back pain   Relevant Medications   methylPREDNISolone (MEDROL DOSEPAK) 4 MG TBPK tablet   Other Relevant Orders   DG Lumbar Spine Complete     Return if symptoms worsen or fail to improve, for keep appt with NK.   Total time spent: 25 minutes  Google, NP  12/20/2022   This document may have been prepared by Dragon Voice Recognition software and as such may include unintentional  dictation errors.

## 2022-12-24 ENCOUNTER — Ambulatory Visit (INDEPENDENT_AMBULATORY_CARE_PROVIDER_SITE_OTHER): Payer: Medicare Other | Admitting: Internal Medicine

## 2022-12-24 ENCOUNTER — Encounter: Payer: Self-pay | Admitting: Internal Medicine

## 2022-12-24 VITALS — BP 142/74 | HR 56 | Ht 64.0 in | Wt 145.2 lb

## 2022-12-24 DIAGNOSIS — M5417 Radiculopathy, lumbosacral region: Secondary | ICD-10-CM | POA: Diagnosis not present

## 2022-12-24 DIAGNOSIS — M5106 Intervertebral disc disorders with myelopathy, lumbar region: Secondary | ICD-10-CM

## 2022-12-24 DIAGNOSIS — I10 Essential (primary) hypertension: Secondary | ICD-10-CM

## 2022-12-24 DIAGNOSIS — E782 Mixed hyperlipidemia: Secondary | ICD-10-CM | POA: Diagnosis not present

## 2022-12-24 MED ORDER — BACLOFEN 10 MG PO TABS: 10.0000 mg | ORAL_TABLET | Freq: Every day | ORAL | 0 refills | Status: DC

## 2022-12-24 MED ORDER — GABAPENTIN 100 MG PO CAPS: 100.0000 mg | ORAL_CAPSULE | Freq: Two times a day (BID) | ORAL | 2 refills | Status: DC

## 2022-12-24 MED ORDER — PREDNISONE 20 MG PO TABS: 40.0000 mg | ORAL_TABLET | Freq: Every day | ORAL | 0 refills | Status: DC

## 2022-12-24 NOTE — Progress Notes (Signed)
Established Patient Office Visit  Subjective:  Patient ID: Anna Chambers, female    DOB: 1940/03/30  Age: 82 y.o. MRN: [id number]  Chief Complaint  Patient presents with   Follow-up    1 month follow up    Patient comes in for follow-up of her lower back pain radiating to the right side.  She was started on prednisone Dosepak and an x-ray of LS spine was ordered.  The results are not back yet and the patient is also not feeling well.  She has almost completed her Dosepak but did not notice any improvement.  She has history of degenerative disc disease of the spine and can gets frequent flareups. As we wait for the x-ray results, will prescribe a prednisone burst baclofen, and gabapentin.    No other concerns at this time.   Past Medical History:  Diagnosis Date   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HCC)    Chronic kidney disease 01/22/2019   Depression    Hypertension    Seizure (HCC) 02/25/2017   Sleep apnea    Syncope and collapse 02/25/2017    Past Surgical History:  Procedure Laterality Date   CATARACT EXTRACTION     CATARACT EXTRACTION W/PHACO Left 04/12/2021   Procedure: CATARACT EXTRACTION PHACO AND INTRAOCULAR LENS PLACEMENT (IOC) LEFT 8.12 01:13.1;  Surgeon: Lockie Mola, MD;  Location: [facility];  Service: Ophthalmology;  Laterality: Left;   COLONOSCOPY     COLONOSCOPY WITH PROPOFOL N/A 12/04/2018   Procedure: COLONOSCOPY WITH PROPOFOL;  Surgeon: Toledo, Boykin Nearing, MD;  Location: [facility];  Service: Gastroenterology;  Laterality: N/A;   EYE SURGERY     FOOT SURGERY Left    NECK SURGERY     SPINE SURGERY     ACDF C3-4   VAGINAL HYSTERECTOMY      Social History   Socioeconomic History   Marital status: Married    Spouse name: Not on file   Number of children: 1   Years of education: Not on file   Highest education level: Not on file  Occupational History   Occupation: retired  Tobacco Use   Smoking status: Never    Smokeless tobacco: Never  Vaping Use   Vaping status: Never Used  Substance and Sexual Activity   Alcohol use: No    Alcohol/week: 0.0 standard drinks of alcohol   Drug use: No   Sexual activity: Not on file  Other Topics Concern   Not on file  Social History Narrative   Lives with husband in a one story home.  Has one child.     Retired from  [employer] and [employer].     Right handed   Social Determinants of Health   Financial Resource Strain: Not on file  Food Insecurity: No Food Insecurity (11/07/2022)   Hunger Vital Sign    Worried About Running Out of Food in the Last Year: Never true    Ran Out of Food in the Last Year: Never true  Transportation Needs: No Transportation Needs (11/07/2022)   PRAPARE - Administrator, Civil Service (Medical): No    Lack of Transportation (Non-Medical): No  Physical Activity: Not on file  Stress: Not on file  Social Connections: Not on file  Intimate Partner Violence: Not At Risk (03/01/2022)   Humiliation, Afraid, Rape, and Kick questionnaire    Fear of Current or Ex-Partner: No    Emotionally Abused: No    Physically Abused: No    Sexually Abused:  No    Family History  Problem Relation Age of Onset   Hypertension Mother    Kidney failure Mother    Heart disease Mother    Hypertension Father    Cancer Father    Hypertension Sister    Hypertension Sister    Healthy Son    Breast cancer Neg Hx     Allergies  Allergen Reactions   Simvastatin Rash    Outpatient Medications Prior to Visit  Medication Sig   allopurinol (ZYLOPRIM) 100 MG tablet TAKE 1 TABLET BY MOUTH EVERY DAY   aspirin 81 MG tablet Take 81 mg by mouth 2 (two) times a week.   brimonidine-timolol (COMBIGAN) 0.2-0.5 % ophthalmic solution Administer 10 drops to both eyes Two (2) times a day. Frequency:PHARMDIR   Dosage:34ml     Instructions:  Note:Instill one drop in each eye twice daily. Dose: 1   brinzolamide (AZOPT) 1 % ophthalmic suspension Administer 1 drop  to both eyes Two (2) times a day (at 8am and 5pm). Frequency:PHARMDIR   Dosage:0.0     Instructions:  Note:Instill one drop in each eye twice a day. Dose: 1   Cholecalciferol (VITAMIN D3) 1.25 MG (50000 UT) CAPS TAKE ONE CAPSULE BY MOUTH ONCE A WEEK   latanoprost (XALATAN) 0.005 % ophthalmic solution Administer 2.5 mL to both eyes nightly. Frequency:QD   Dosage:0.0     Instructions:  Note:Dose: 0.005 %   levETIRAcetam (KEPPRA) 500 MG tablet TAKE 1 TABLET BY MOUTH TWICE A DAY   losartan (COZAAR) 50 MG tablet Take 50 mg by mouth daily.   meloxicam (MOBIC) 7.5 MG tablet Take 1 tablet (7.5 mg total) by mouth daily.   methylPREDNISolone (MEDROL DOSEPAK) 4 MG TBPK tablet Use as directed   ofloxacin (OCUFLOX) 0.3 % ophthalmic solution Place 1 drop into the left eye every 4 (four) hours.   oxybutynin (DITROPAN-XL) 5 MG 24 hr tablet TAKE 1 TABLET BY MOUTH EVERY DAY   prednisoLONE acetate (PRED FORTE) 1 % ophthalmic suspension Place 1 drop into the left eye 4 (four) times daily.   ROCKLATAN 0.02-0.005 % SOLN Place 1 drop into both eyes daily.   torsemide (DEMADEX) 10 MG tablet Take 10 mg by mouth daily. Take Monday, Wednesday, and Friday   [DISCONTINUED] baclofen (LIORESAL) 10 MG tablet Take 1 tablet (10 mg total) by mouth at bedtime.   No facility-administered medications prior to visit.    Review of Systems  Constitutional: Negative.  Negative for chills, diaphoresis, fever and weight loss.  HENT: Negative.  Negative for congestion and sore throat.   Eyes: Negative.   Respiratory: Negative.  Negative for cough and shortness of breath.   Cardiovascular: Negative.  Negative for chest pain, palpitations and leg swelling.  Gastrointestinal: Negative.  Negative for abdominal pain, constipation, diarrhea, heartburn, nausea and vomiting.  Genitourinary: Negative.  Negative for dysuria and flank pain.  Musculoskeletal:  Positive for back pain. Negative for falls, joint pain and myalgias.  Skin: Negative.    Neurological: Negative.  Negative for dizziness and headaches.  Endo/Heme/Allergies: Negative.   Psychiatric/Behavioral: Negative.  Negative for depression and suicidal ideas. The patient is not nervous/anxious.        Objective:   BP (!) 142/74   Pulse (!) 56   Ht 5\' 4"  (1.626 m)   Wt 145 lb 3.2 oz (65.9 kg)   LMP  (LMP Unknown)   SpO2 98%   BMI 24.92 kg/m   Vitals:   12/24/22 1020  BP: (!) 142/74  Pulse: (!) 56  Height: 5\' 4"  (1.626 m)  Weight: 145 lb 3.2 oz (65.9 kg)  SpO2: 98%  BMI (Calculated): 24.91    Physical Exam Vitals and nursing note reviewed.  Constitutional:      Appearance: Normal appearance.  HENT:     Head: Normocephalic and atraumatic.     Nose: Nose normal.     Mouth/Throat:     Mouth: Mucous membranes are moist.     Pharynx: Oropharynx is clear.  Eyes:     Conjunctiva/sclera: Conjunctivae normal.     Pupils: Pupils are equal, round, and reactive to light.  Cardiovascular:     Rate and Rhythm: Normal rate and regular rhythm.     Pulses: Normal pulses.     Heart sounds: Normal heart sounds. No murmur heard. Pulmonary:     Effort: Pulmonary effort is normal.     Breath sounds: Normal breath sounds. No wheezing.  Abdominal:     General: Bowel sounds are normal.     Palpations: Abdomen is soft.     Tenderness: There is no abdominal tenderness. There is no right CVA tenderness or left CVA tenderness.  Musculoskeletal:        General: Normal range of motion.     Cervical back: Normal range of motion.     Right lower leg: No edema.     Left lower leg: No edema.  Skin:    General: Skin is warm and dry.  Neurological:     General: No focal deficit present.     Mental Status: She is alert and oriented to person, place, and time.  Psychiatric:        Mood and Affect: Mood normal.        Behavior: Behavior normal.      No results found for any visits on 12/24/22.  Recent Results (from the past 2160 hour(s))  POCT Urinalysis Dipstick  (96295)     Status: Abnormal   Collection Time: 10/15/22  1:42 PM  Result Value Ref Range   Color, UA orange    Clarity, UA cloudy    Glucose, UA Negative Negative   Bilirubin, UA neg    Ketones, UA trace    Spec Grav, UA 1.020 1.010 - 1.025   Blood, UA neg    pH, UA 6.0 5.0 - 8.0   Protein, UA Positive (A) Negative   Urobilinogen, UA 0.2 0.2 or 1.0 E.U./dL   Nitrite, UA neg    Leukocytes, UA Negative Negative   Appearance cloudy    Odor yes   POCT XPERT XPRESS SARS COVID-2/FLU/RSV     Status: None   Collection Time: 10/15/22  2:37 PM  Result Value Ref Range   SARS Coronavirus 2 neg    FLU A neg    FLU B neg    RSV RNA, PCR neg   Uric acid     Status: None   Collection Time: 11/23/22 12:01 PM  Result Value Ref Range   Uric Acid 4.1 3.1 - 7.9 mg/dL    Comment:            Therapeutic target for gout patients: <6.0  CBC with Diff     Status: None   Collection Time: 11/23/22 12:01 PM  Result Value Ref Range   WBC 6.4 3.4 - 10.8 x10E3/uL   RBC 4.24 3.77 - 5.28 x10E6/uL   Hemoglobin 11.3 11.1 - 15.9 g/dL   Hematocrit 28.4 13.2 - 46.6 %   MCV 84 79 -  97 fL   MCH 26.7 26.6 - 33.0 pg   MCHC 31.7 31.5 - 35.7 g/dL   RDW 13.0 86.5 - 78.4 %   Platelets 296 150 - 450 x10E3/uL   Neutrophils 52 Not Estab. %   Lymphs 35 Not Estab. %   Monocytes 9 Not Estab. %   Eos 4 Not Estab. %   Basos 0 Not Estab. %   Neutrophils Absolute 3.3 1.4 - 7.0 x10E3/uL   Lymphocytes Absolute 2.2 0.7 - 3.1 x10E3/uL   Monocytes Absolute 0.5 0.1 - 0.9 x10E3/uL   EOS (ABSOLUTE) 0.3 0.0 - 0.4 x10E3/uL   Basophils Absolute 0.0 0.0 - 0.2 x10E3/uL   Immature Granulocytes 0 Not Estab. %   Immature Grans (Abs) 0.0 0.0 - 0.1 x10E3/uL  CMP14+EGFR     Status: Abnormal   Collection Time: 11/23/22 12:01 PM  Result Value Ref Range   Glucose 92 70 - 99 mg/dL   BUN 9 8 - 27 mg/dL   Creatinine, Ser 6.96 (H) 0.57 - 1.00 mg/dL   eGFR 51 (L) >29 BM/WUX/3.24   BUN/Creatinine Ratio 8 (L) 12 - 28   Sodium 145 (H) 134 -  144 mmol/L   Potassium 4.0 3.5 - 5.2 mmol/L   Chloride 107 (H) 96 - 106 mmol/L   CO2 23 20 - 29 mmol/L   Calcium 9.4 8.7 - 10.3 mg/dL   Total Protein 6.5 6.0 - 8.5 g/dL   Albumin 4.2 3.7 - 4.7 g/dL   Globulin, Total 2.3 1.5 - 4.5 g/dL   Bilirubin Total 0.7 0.0 - 1.2 mg/dL   Alkaline Phosphatase 108 44 - 121 IU/L   AST 18 0 - 40 IU/L   ALT 13 0 - 32 IU/L  Lipid Panel w/o Chol/HDL Ratio     Status: Abnormal   Collection Time: 11/23/22 12:01 PM  Result Value Ref Range   Cholesterol, Total 206 (H) 100 - 199 mg/dL   Triglycerides 80 0 - 149 mg/dL   HDL 68 >40 mg/dL   VLDL Cholesterol Cal 14 5 - 40 mg/dL   LDL Chol Calc (NIH) 102 (H) 0 - 99 mg/dL  Hemoglobin V2Z     Status: None   Collection Time: 11/23/22 12:01 PM  Result Value Ref Range   Hgb A1c MFr Bld 5.6 4.8 - 5.6 %    Comment:          Prediabetes: 5.7 - 6.4          Diabetes: >6.4          Glycemic control for adults with diabetes: <7.0    Est. average glucose Bld gHb Est-mCnc 114 mg/dL      Assessment & Plan:  Prescription sent for prednisone burst, baclofen and gabapentin.  Patient patient encouraged to do stretching exercises of the lower back. Will discuss further once x-ray results are available. Problem List Items Addressed This Visit     Lumbosacral radiculopathy - Primary   Relevant Medications   baclofen (LIORESAL) 10 MG tablet   gabapentin (NEURONTIN) 100 MG capsule   Hyperlipemia   Essential hypertension, benign   Lumbar disc disorder with myelopathy   Relevant Medications   baclofen (LIORESAL) 10 MG tablet   predniSONE (DELTASONE) 20 MG tablet   gabapentin (NEURONTIN) 100 MG capsule    Return in about 1 week (around 12/31/2022).   Total time spent: 30 minutes  Margaretann Loveless, MD  12/24/2022   This document may have been prepared by [employer] Voice Recognition software and  as such may include unintentional dictation errors.

## 2022-12-25 ENCOUNTER — Ambulatory Visit
Admission: RE | Admit: 2022-12-25 | Discharge: 2022-12-25 | Disposition: A | Discharge status: Home / Self Care | Payer: Medicare Other | Source: Ambulatory Visit | Attending: Internal Medicine | Admitting: Internal Medicine

## 2022-12-25 DIAGNOSIS — Z1231 Encounter for screening mammogram for malignant neoplasm of breast: Secondary | ICD-10-CM | POA: Insufficient documentation

## 2022-12-28 ENCOUNTER — Telehealth: Payer: Self-pay

## 2022-12-28 NOTE — Telephone Encounter (Signed)
Called wanting xray results. I checked and they haven't been finalized yet. Will call her results are ready.

## 2022-12-31 ENCOUNTER — Ambulatory Visit
Admission: RE | Admit: 2022-12-31 | Discharge: 2022-12-31 | Disposition: A | Discharge status: Home / Self Care | Payer: Medicare Other | Source: Ambulatory Visit | Attending: Internal Medicine | Admitting: Internal Medicine

## 2022-12-31 ENCOUNTER — Encounter: Payer: Self-pay | Admitting: Internal Medicine

## 2022-12-31 ENCOUNTER — Ambulatory Visit (INDEPENDENT_AMBULATORY_CARE_PROVIDER_SITE_OTHER): Payer: Medicare Other | Admitting: Internal Medicine

## 2022-12-31 ENCOUNTER — Ambulatory Visit
Admission: RE | Admit: 2022-12-31 | Discharge: 2022-12-31 | Disposition: A | Discharge status: Home / Self Care | Payer: Medicare Other | Attending: Internal Medicine | Admitting: Internal Medicine

## 2022-12-31 VITALS — BP 121/82 | HR 60 | Ht 64.0 in | Wt 145.6 lb

## 2022-12-31 DIAGNOSIS — E782 Mixed hyperlipidemia: Secondary | ICD-10-CM

## 2022-12-31 DIAGNOSIS — I10 Essential (primary) hypertension: Secondary | ICD-10-CM | POA: Diagnosis not present

## 2022-12-31 DIAGNOSIS — M25551 Pain in right hip: Secondary | ICD-10-CM

## 2022-12-31 DIAGNOSIS — M5417 Radiculopathy, lumbosacral region: Secondary | ICD-10-CM

## 2022-12-31 DIAGNOSIS — N1831 Chronic kidney disease, stage 3a: Secondary | ICD-10-CM

## 2022-12-31 NOTE — Progress Notes (Signed)
Established Patient Office Visit  Subjective:  Patient ID: Anna Chambers, female    DOB: 08-Nov-1940  Age: 82 y.o. MRN: [id number]  Chief Complaint  Patient presents with   Follow-up    Patient comes back for her follow-up today.  At her last visit she was prescribed prednisone burst, muscle relaxant, and gabapentin to help with radicular type pain which started in the lumbar spine.  The results of x-ray of the LS spine  are not available still, but the patient complains of persistent pain which has now shifted towards her right hip primarily, and she is having difficulty with weightbearing. Will get an x-ray of the hip and pelvis, may need orthopedic consult.    No other concerns at this time.   Past Medical History:  Diagnosis Date   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HCC)    Chronic kidney disease 01/22/2019   Depression    Hypertension    Seizure (HCC) 02/25/2017   Sleep apnea    Syncope and collapse 02/25/2017    Past Surgical History:  Procedure Laterality Date   CATARACT EXTRACTION     CATARACT EXTRACTION W/PHACO Left 04/12/2021   Procedure: CATARACT EXTRACTION PHACO AND INTRAOCULAR LENS PLACEMENT (IOC) LEFT 8.12 01:13.1;  Surgeon: Lockie Mola, MD;  Location: [facility];  Service: Ophthalmology;  Laterality: Left;   COLONOSCOPY     COLONOSCOPY WITH PROPOFOL N/A 12/04/2018   Procedure: COLONOSCOPY WITH PROPOFOL;  Surgeon: Toledo, Boykin Nearing, MD;  Location: [facility];  Service: Gastroenterology;  Laterality: N/A;   EYE SURGERY     FOOT SURGERY Left    NECK SURGERY     SPINE SURGERY     ACDF C3-4   VAGINAL HYSTERECTOMY      Social History   Socioeconomic History   Marital status: Married    Spouse name: Not on file   Number of children: 1   Years of education: Not on file   Highest education level: Not on file  Occupational History   Occupation: retired  Tobacco Use   Smoking status: Never   Smokeless tobacco: Never   Vaping Use   Vaping status: Never Used  Substance and Sexual Activity   Alcohol use: No    Alcohol/week: 0.0 standard drinks of alcohol   Drug use: No   Sexual activity: Not on file  Other Topics Concern   Not on file  Social History Narrative   Lives with husband in a one story home.  Has one child.     Retired from  [employer] and [employer].     Right handed   Social Determinants of Health   Financial Resource Strain: Not on file  Food Insecurity: No Food Insecurity (11/07/2022)   Hunger Vital Sign    Worried About Running Out of Food in the Last Year: Never true    Ran Out of Food in the Last Year: Never true  Transportation Needs: No Transportation Needs (11/07/2022)   PRAPARE - Administrator, Civil Service (Medical): No    Lack of Transportation (Non-Medical): No  Physical Activity: Not on file  Stress: Not on file  Social Connections: Not on file  Intimate Partner Violence: Not At Risk (03/01/2022)   Humiliation, Afraid, Rape, and Kick questionnaire    Fear of Current or Ex-Partner: No    Emotionally Abused: No    Physically Abused: No    Sexually Abused: No    Family History  Problem Relation Age of Onset  Hypertension Mother    Kidney failure Mother    Heart disease Mother    Hypertension Father    Cancer Father    Hypertension Sister    Hypertension Sister    Healthy Son    Breast cancer Neg Hx     Allergies  Allergen Reactions   Simvastatin Rash    Outpatient Medications Prior to Visit  Medication Sig   allopurinol (ZYLOPRIM) 100 MG tablet TAKE 1 TABLET BY MOUTH EVERY DAY   aspirin 81 MG tablet Take 81 mg by mouth 2 (two) times a week.   baclofen (LIORESAL) 10 MG tablet Take 1 tablet (10 mg total) by mouth at bedtime.   brimonidine-timolol (COMBIGAN) 0.2-0.5 % ophthalmic solution Administer 10 drops to both eyes Two (2) times a day. Frequency:PHARMDIR   Dosage:40ml     Instructions:  Note:Instill one drop in each eye twice daily. Dose: 1    brinzolamide (AZOPT) 1 % ophthalmic suspension Administer 1 drop to both eyes Two (2) times a day (at 8am and 5pm). Frequency:PHARMDIR   Dosage:0.0     Instructions:  Note:Instill one drop in each eye twice a day. Dose: 1   Cholecalciferol (VITAMIN D3) 1.25 MG (50000 UT) CAPS TAKE ONE CAPSULE BY MOUTH ONCE A WEEK   gabapentin (NEURONTIN) 100 MG capsule Take 1 capsule (100 mg total) by mouth 2 (two) times daily.   latanoprost (XALATAN) 0.005 % ophthalmic solution Administer 2.5 mL to both eyes nightly. Frequency:QD   Dosage:0.0     Instructions:  Note:Dose: 0.005 %   levETIRAcetam (KEPPRA) 500 MG tablet TAKE 1 TABLET BY MOUTH TWICE A DAY   losartan (COZAAR) 50 MG tablet Take 50 mg by mouth daily.   meloxicam (MOBIC) 7.5 MG tablet Take 1 tablet (7.5 mg total) by mouth daily.   methylPREDNISolone (MEDROL DOSEPAK) 4 MG TBPK tablet Use as directed   ofloxacin (OCUFLOX) 0.3 % ophthalmic solution Place 1 drop into the left eye every 4 (four) hours.   oxybutynin (DITROPAN-XL) 5 MG 24 hr tablet TAKE 1 TABLET BY MOUTH EVERY DAY   prednisoLONE acetate (PRED FORTE) 1 % ophthalmic suspension Place 1 drop into the left eye 4 (four) times daily.   ROCKLATAN 0.02-0.005 % SOLN Place 1 drop into both eyes daily.   torsemide (DEMADEX) 10 MG tablet Take 10 mg by mouth daily. Take Monday, Wednesday, and Friday   [DISCONTINUED] predniSONE (DELTASONE) 20 MG tablet Take 2 tablets (40 mg total) by mouth daily with breakfast.   No facility-administered medications prior to visit.    Review of Systems  Constitutional: Negative.  Negative for chills, fever, malaise/fatigue and weight loss.  HENT: Negative.    Eyes: Negative.   Respiratory: Negative.  Negative for cough and shortness of breath.   Cardiovascular: Negative.  Negative for chest pain, palpitations and leg swelling.  Gastrointestinal: Negative.  Negative for abdominal pain, constipation, diarrhea, heartburn, nausea and vomiting.  Genitourinary: Negative.   Negative for dysuria and flank pain.  Musculoskeletal:  Positive for back pain and joint pain. Negative for falls and myalgias.  Skin: Negative.   Neurological: Negative.  Negative for dizziness, tingling, tremors and headaches.  Endo/Heme/Allergies: Negative.   Psychiatric/Behavioral: Negative.  Negative for depression and suicidal ideas. The patient is not nervous/anxious.        Objective:   BP 121/82   Pulse 60   Ht 5\' 4"  (1.626 m)   Wt 145 lb 9.6 oz (66 kg)   LMP  (LMP Unknown)  SpO2 99%   BMI 24.99 kg/m   Vitals:   12/31/22 1335  BP: 121/82  Pulse: 60  Height: 5\' 4"  (1.626 m)  Weight: 145 lb 9.6 oz (66 kg)  SpO2: 99%  BMI (Calculated): 24.98    Physical Exam Vitals and nursing note reviewed.  Constitutional:      Appearance: Normal appearance.  HENT:     Head: Normocephalic and atraumatic.     Nose: Nose normal.     Mouth/Throat:     Mouth: Mucous membranes are moist.     Pharynx: Oropharynx is clear.  Eyes:     Conjunctiva/sclera: Conjunctivae normal.     Pupils: Pupils are equal, round, and reactive to light.  Cardiovascular:     Rate and Rhythm: Normal rate and regular rhythm.     Pulses: Normal pulses.     Heart sounds: Normal heart sounds. No murmur heard. Pulmonary:     Effort: Pulmonary effort is normal.     Breath sounds: Normal breath sounds. No wheezing.  Abdominal:     General: Bowel sounds are normal.     Palpations: Abdomen is soft.     Tenderness: There is no abdominal tenderness. There is no right CVA tenderness or left CVA tenderness.  Musculoskeletal:        General: Normal range of motion.     Cervical back: Normal range of motion.     Right lower leg: No edema.     Left lower leg: No edema.  Skin:    General: Skin is warm and dry.  Neurological:     General: No focal deficit present.     Mental Status: She is alert and oriented to person, place, and time.  Psychiatric:        Mood and Affect: Mood normal.        Behavior:  Behavior normal.      No results found for any visits on 12/31/22.  Recent Results (from the past 2160 hour(s))  POCT Urinalysis Dipstick (16109)     Status: Abnormal   Collection Time: 10/15/22  1:42 PM  Result Value Ref Range   Color, UA orange    Clarity, UA cloudy    Glucose, UA Negative Negative   Bilirubin, UA neg    Ketones, UA trace    Spec Grav, UA 1.020 1.010 - 1.025   Blood, UA neg    pH, UA 6.0 5.0 - 8.0   Protein, UA Positive (A) Negative   Urobilinogen, UA 0.2 0.2 or 1.0 E.U./dL   Nitrite, UA neg    Leukocytes, UA Negative Negative   Appearance cloudy    Odor yes   POCT XPERT XPRESS SARS COVID-2/FLU/RSV     Status: None   Collection Time: 10/15/22  2:37 PM  Result Value Ref Range   SARS Coronavirus 2 neg    FLU A neg    FLU B neg    RSV RNA, PCR neg   Uric acid     Status: None   Collection Time: 11/23/22 12:01 PM  Result Value Ref Range   Uric Acid 4.1 3.1 - 7.9 mg/dL    Comment:            Therapeutic target for gout patients: <6.0  CBC with Diff     Status: None   Collection Time: 11/23/22 12:01 PM  Result Value Ref Range   WBC 6.4 3.4 - 10.8 x10E3/uL   RBC 4.24 3.77 - 5.28 x10E6/uL   Hemoglobin 11.3  11.1 - 15.9 g/dL   Hematocrit 16.1 09.6 - 46.6 %   MCV 84 79 - 97 fL   MCH 26.7 26.6 - 33.0 pg   MCHC 31.7 31.5 - 35.7 g/dL   RDW 04.5 40.9 - 81.1 %   Platelets 296 150 - 450 x10E3/uL   Neutrophils 52 Not Estab. %   Lymphs 35 Not Estab. %   Monocytes 9 Not Estab. %   Eos 4 Not Estab. %   Basos 0 Not Estab. %   Neutrophils Absolute 3.3 1.4 - 7.0 x10E3/uL   Lymphocytes Absolute 2.2 0.7 - 3.1 x10E3/uL   Monocytes Absolute 0.5 0.1 - 0.9 x10E3/uL   EOS (ABSOLUTE) 0.3 0.0 - 0.4 x10E3/uL   Basophils Absolute 0.0 0.0 - 0.2 x10E3/uL   Immature Granulocytes 0 Not Estab. %   Immature Grans (Abs) 0.0 0.0 - 0.1 x10E3/uL  CMP14+EGFR     Status: Abnormal   Collection Time: 11/23/22 12:01 PM  Result Value Ref Range   Glucose 92 70 - 99 mg/dL   BUN 9 8 -  27 mg/dL   Creatinine, Ser 9.14 (H) 0.57 - 1.00 mg/dL   eGFR 51 (L) >78 GN/FAO/1.30   BUN/Creatinine Ratio 8 (L) 12 - 28   Sodium 145 (H) 134 - 144 mmol/L   Potassium 4.0 3.5 - 5.2 mmol/L   Chloride 107 (H) 96 - 106 mmol/L   CO2 23 20 - 29 mmol/L   Calcium 9.4 8.7 - 10.3 mg/dL   Total Protein 6.5 6.0 - 8.5 g/dL   Albumin 4.2 3.7 - 4.7 g/dL   Globulin, Total 2.3 1.5 - 4.5 g/dL   Bilirubin Total 0.7 0.0 - 1.2 mg/dL   Alkaline Phosphatase 108 44 - 121 IU/L   AST 18 0 - 40 IU/L   ALT 13 0 - 32 IU/L  Lipid Panel w/o Chol/HDL Ratio     Status: Abnormal   Collection Time: 11/23/22 12:01 PM  Result Value Ref Range   Cholesterol, Total 206 (H) 100 - 199 mg/dL   Triglycerides 80 0 - 149 mg/dL   HDL 68 >86 mg/dL   VLDL Cholesterol Cal 14 5 - 40 mg/dL   LDL Chol Calc (NIH) 578 (H) 0 - 99 mg/dL  Hemoglobin I6N     Status: None   Collection Time: 11/23/22 12:01 PM  Result Value Ref Range   Hgb A1c MFr Bld 5.6 4.8 - 5.6 %    Comment:          Prediabetes: 5.7 - 6.4          Diabetes: >6.4          Glycemic control for adults with diabetes: <7.0    Est. average glucose Bld gHb Est-mCnc 114 mg/dL      Assessment & Plan:  Patient advised to continue gabapentin, and to take Tylenol as needed.  She has limited use of meloxicam due to her chronic kidney disease. Check x-ray of the right hip and pelvis, may need orthopedic consult. Problem List Items Addressed This Visit     Lumbosacral radiculopathy   Chronic kidney disease   Hyperlipemia   Essential hypertension, benign - Primary   Other Visit Diagnoses     Right hip pain       Relevant Orders   DG Hip Unilat W OR W/O Pelvis 1V Right   DG Hip Unilat W OR W/O Pelvis 2-3 Views Right (Completed)       Return in about 10 days (around  01/10/2023).   Total time spent: 30 minutes  Margaretann Loveless, MD  12/31/2022   This document may have been prepared by [facility] Voice Recognition software and as such may include unintentional  dictation errors.

## 2023-01-10 ENCOUNTER — Ambulatory Visit: Payer: Medicare Other | Admitting: Internal Medicine

## 2023-01-10 ENCOUNTER — Encounter: Payer: Self-pay | Admitting: Internal Medicine

## 2023-01-10 VITALS — BP 140/80 | HR 69 | Ht 64.0 in | Wt 146.2 lb

## 2023-01-10 DIAGNOSIS — I10 Essential (primary) hypertension: Secondary | ICD-10-CM | POA: Diagnosis not present

## 2023-01-10 DIAGNOSIS — E782 Mixed hyperlipidemia: Secondary | ICD-10-CM | POA: Diagnosis not present

## 2023-01-10 DIAGNOSIS — M25551 Pain in right hip: Secondary | ICD-10-CM | POA: Diagnosis not present

## 2023-01-10 DIAGNOSIS — M5417 Radiculopathy, lumbosacral region: Secondary | ICD-10-CM

## 2023-01-10 DIAGNOSIS — N1831 Chronic kidney disease, stage 3a: Secondary | ICD-10-CM

## 2023-01-10 NOTE — Progress Notes (Signed)
Established Patient Office Visit  Subjective:  Patient ID: Anna Chambers, female    DOB: [birth year]  Age: 82 y.o. MRN: [id number]  Chief Complaint  Patient presents with   Follow-up    10 day    Patient comes in for follow-up of her lower back pain and right hip pain.  Her recent x-rays of the LS spine as well as the right hip show osteoarthritis.  Patient has been treated with oral steroid, meloxicam, and is now on gabapentin.  However she continues to have discomfort over the right hip.  Agrees to be seen by orthopedic.  May need more physical therapy.    No other concerns at this time.   Past Medical History:  Diagnosis Date   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HCC)    Chronic kidney disease 01/22/2019   Depression    Hypertension    Seizure (HCC) 02/25/2017   Sleep apnea    Syncope and collapse 02/25/2017    Past Surgical History:  Procedure Laterality Date   CATARACT EXTRACTION     CATARACT EXTRACTION W/PHACO Left 04/12/2021   Procedure: CATARACT EXTRACTION PHACO AND INTRAOCULAR LENS PLACEMENT (IOC) LEFT 8.12 01:13.1;  Surgeon: Lockie Mola, MD;  Location: [facility];  Service: Ophthalmology;  Laterality: Left;   COLONOSCOPY     COLONOSCOPY WITH PROPOFOL N/A 12/04/2018   Procedure: COLONOSCOPY WITH PROPOFOL;  Surgeon: Toledo, Boykin Nearing, MD;  Location: [facility];  Service: Gastroenterology;  Laterality: N/A;   EYE SURGERY     FOOT SURGERY Left    NECK SURGERY     SPINE SURGERY     ACDF C3-4   VAGINAL HYSTERECTOMY      Social History   Socioeconomic History   Marital status: Married    Spouse name: Not on file   Number of children: 1   Years of education: Not on file   Highest education level: Not on file  Occupational History   Occupation: retired  Tobacco Use   Smoking status: Never   Smokeless tobacco: Never  Vaping Use   Vaping status: Never Used  Substance and Sexual Activity   Alcohol use: No    Alcohol/week: 0.0  standard drinks of alcohol   Drug use: No   Sexual activity: Not on file  Other Topics Concern   Not on file  Social History Narrative   Lives with husband in a one story home.  Has one child.     Retired from  [employer] and [employer].     Right handed   Social Determinants of Health   Financial Resource Strain: Not on file  Food Insecurity: No Food Insecurity (11/07/2022)   Hunger Vital Sign    Worried About Running Out of Food in the Last Year: Never true    Ran Out of Food in the Last Year: Never true  Transportation Needs: No Transportation Needs (11/07/2022)   PRAPARE - Administrator, Civil Service (Medical): No    Lack of Transportation (Non-Medical): No  Physical Activity: Not on file  Stress: Not on file  Social Connections: Not on file  Intimate Partner Violence: Not At Risk (03/01/2022)   Humiliation, Afraid, Rape, and Kick questionnaire    Fear of Current or Ex-Partner: No    Emotionally Abused: No    Physically Abused: No    Sexually Abused: No    Family History  Problem Relation Age of Onset   Hypertension Mother    Kidney failure Mother  Heart disease Mother    Hypertension Father    Cancer Father    Hypertension Sister    Hypertension Sister    Healthy Son    Breast cancer Neg Hx     Allergies  Allergen Reactions   Simvastatin Rash    Outpatient Medications Prior to Visit  Medication Sig   allopurinol (ZYLOPRIM) 100 MG tablet TAKE 1 TABLET BY MOUTH EVERY DAY   aspirin 81 MG tablet Take 81 mg by mouth 2 (two) times a week.   baclofen (LIORESAL) 10 MG tablet Take 1 tablet (10 mg total) by mouth at bedtime.   brimonidine-timolol (COMBIGAN) 0.2-0.5 % ophthalmic solution Administer 10 drops to both eyes Two (2) times a day. Frequency:PHARMDIR   Dosage:56ml     Instructions:  Note:Instill one drop in each eye twice daily. Dose: 1   brinzolamide (AZOPT) 1 % ophthalmic suspension Administer 1 drop to both eyes Two (2) times a day (at 8am and 5pm).  Frequency:PHARMDIR   Dosage:0.0     Instructions:  Note:Instill one drop in each eye twice a day. Dose: 1   Cholecalciferol (VITAMIN D3) 1.25 MG (50000 UT) CAPS TAKE ONE CAPSULE BY MOUTH ONCE A WEEK   gabapentin (NEURONTIN) 100 MG capsule Take 1 capsule (100 mg total) by mouth 2 (two) times daily.   latanoprost (XALATAN) 0.005 % ophthalmic solution Administer 2.5 mL to both eyes nightly. Frequency:QD   Dosage:0.0     Instructions:  Note:Dose: 0.005 %   levETIRAcetam (KEPPRA) 500 MG tablet TAKE 1 TABLET BY MOUTH TWICE A DAY   losartan (COZAAR) 50 MG tablet Take 50 mg by mouth daily.   meloxicam (MOBIC) 7.5 MG tablet Take 1 tablet (7.5 mg total) by mouth daily.   ofloxacin (OCUFLOX) 0.3 % ophthalmic solution Place 1 drop into the left eye every 4 (four) hours.   oxybutynin (DITROPAN-XL) 5 MG 24 hr tablet TAKE 1 TABLET BY MOUTH EVERY DAY   prednisoLONE acetate (PRED FORTE) 1 % ophthalmic suspension Place 1 drop into the left eye 4 (four) times daily.   ROCKLATAN 0.02-0.005 % SOLN Place 1 drop into both eyes daily.   torsemide (DEMADEX) 10 MG tablet Take 10 mg by mouth daily. Take Monday, Wednesday, and Friday   [DISCONTINUED] methylPREDNISolone (MEDROL DOSEPAK) 4 MG TBPK tablet Use as directed (Patient not taking: Reported on 01/10/2023)   No facility-administered medications prior to visit.    Review of Systems  Constitutional: Negative.  Negative for chills, fever, malaise/fatigue and weight loss.  HENT: Negative.  Negative for congestion and sore throat.   Eyes: Negative.   Respiratory: Negative.  Negative for cough, shortness of breath and stridor.   Cardiovascular: Negative.  Negative for chest pain, palpitations and leg swelling.  Gastrointestinal: Negative.  Negative for abdominal pain, constipation, diarrhea, heartburn, nausea and vomiting.  Genitourinary: Negative.  Negative for dysuria and flank pain.  Musculoskeletal:  Positive for back pain and joint pain. Negative for myalgias.   Skin: Negative.   Neurological:  Negative for dizziness, tingling, tremors, sensory change and headaches.  Endo/Heme/Allergies: Negative.   Psychiatric/Behavioral: Negative.  Negative for depression and suicidal ideas. The patient is not nervous/anxious.        Objective:   BP (!) 140/80   Pulse 69   Ht 5\' 4"  (1.626 m)   Wt 146 lb 3.2 oz (66.3 kg)   LMP  (LMP Unknown)   SpO2 100%   BMI 25.10 kg/m   Vitals:   01/10/23 1136  BP: Marland Kitchen)  140/80  Pulse: 69  Height: 5\' 4"  (1.626 m)  Weight: 146 lb 3.2 oz (66.3 kg)  SpO2: 100%  BMI (Calculated): 25.08    Physical Exam Vitals and nursing note reviewed.  Constitutional:      Appearance: Normal appearance.  HENT:     Head: Normocephalic and atraumatic.     Nose: Nose normal.     Mouth/Throat:     Mouth: Mucous membranes are moist.     Pharynx: Oropharynx is clear.  Eyes:     Conjunctiva/sclera: Conjunctivae normal.     Pupils: Pupils are equal, round, and reactive to light.  Cardiovascular:     Rate and Rhythm: Normal rate and regular rhythm.     Pulses: Normal pulses.     Heart sounds: Normal heart sounds. No murmur heard. Pulmonary:     Effort: Pulmonary effort is normal.     Breath sounds: Normal breath sounds. No wheezing.  Abdominal:     General: Bowel sounds are normal.     Palpations: Abdomen is soft.     Tenderness: There is no abdominal tenderness. There is no right CVA tenderness or left CVA tenderness.  Musculoskeletal:        General: Normal range of motion.     Cervical back: Normal range of motion.     Right lower leg: No edema.     Left lower leg: No edema.  Skin:    General: Skin is warm and dry.  Neurological:     General: No focal deficit present.     Mental Status: She is alert and oriented to person, place, and time.  Psychiatric:        Mood and Affect: Mood normal.        Behavior: Behavior normal.      No results found for any visits on 01/10/23.  Recent Results (from the past 2160  hour(s))  POCT Urinalysis Dipstick (96045)     Status: Abnormal   Collection Time: 10/15/22  1:42 PM  Result Value Ref Range   Color, UA orange    Clarity, UA cloudy    Glucose, UA Negative Negative   Bilirubin, UA neg    Ketones, UA trace    Spec Grav, UA 1.020 1.010 - 1.025   Blood, UA neg    pH, UA 6.0 5.0 - 8.0   Protein, UA Positive (A) Negative   Urobilinogen, UA 0.2 0.2 or 1.0 E.U./dL   Nitrite, UA neg    Leukocytes, UA Negative Negative   Appearance cloudy    Odor yes   POCT XPERT XPRESS SARS COVID-2/FLU/RSV     Status: None   Collection Time: 10/15/22  2:37 PM  Result Value Ref Range   SARS Coronavirus 2 neg    FLU A neg    FLU B neg    RSV RNA, PCR neg   Uric acid     Status: None   Collection Time: 11/23/22 12:01 PM  Result Value Ref Range   Uric Acid 4.1 3.1 - 7.9 mg/dL    Comment:            Therapeutic target for gout patients: <6.0  CBC with Diff     Status: None   Collection Time: 11/23/22 12:01 PM  Result Value Ref Range   WBC 6.4 3.4 - 10.8 x10E3/uL   RBC 4.24 3.77 - 5.28 x10E6/uL   Hemoglobin 11.3 11.1 - 15.9 g/dL   Hematocrit 40.9 81.1 - 46.6 %   MCV 84  79 - 97 fL   MCH 26.7 26.6 - 33.0 pg   MCHC 31.7 31.5 - 35.7 g/dL   RDW 29.5 28.4 - 13.2 %   Platelets 296 150 - 450 x10E3/uL   Neutrophils 52 Not Estab. %   Lymphs 35 Not Estab. %   Monocytes 9 Not Estab. %   Eos 4 Not Estab. %   Basos 0 Not Estab. %   Neutrophils Absolute 3.3 1.4 - 7.0 x10E3/uL   Lymphocytes Absolute 2.2 0.7 - 3.1 x10E3/uL   Monocytes Absolute 0.5 0.1 - 0.9 x10E3/uL   EOS (ABSOLUTE) 0.3 0.0 - 0.4 x10E3/uL   Basophils Absolute 0.0 0.0 - 0.2 x10E3/uL   Immature Granulocytes 0 Not Estab. %   Immature Grans (Abs) 0.0 0.0 - 0.1 x10E3/uL  CMP14+EGFR     Status: Abnormal   Collection Time: 11/23/22 12:01 PM  Result Value Ref Range   Glucose 92 70 - 99 mg/dL   BUN 9 8 - 27 mg/dL   Creatinine, Ser 4.40 (H) 0.57 - 1.00 mg/dL   eGFR 51 (L) >10 UV/OZD/6.64   BUN/Creatinine Ratio 8  (L) 12 - 28   Sodium 145 (H) 134 - 144 mmol/L   Potassium 4.0 3.5 - 5.2 mmol/L   Chloride 107 (H) 96 - 106 mmol/L   CO2 23 20 - 29 mmol/L   Calcium 9.4 8.7 - 10.3 mg/dL   Total Protein 6.5 6.0 - 8.5 g/dL   Albumin 4.2 3.7 - 4.7 g/dL   Globulin, Total 2.3 1.5 - 4.5 g/dL   Bilirubin Total 0.7 0.0 - 1.2 mg/dL   Alkaline Phosphatase 108 44 - 121 IU/L   AST 18 0 - 40 IU/L   ALT 13 0 - 32 IU/L  Lipid Panel w/o Chol/HDL Ratio     Status: Abnormal   Collection Time: 11/23/22 12:01 PM  Result Value Ref Range   Cholesterol, Total 206 (H) 100 - 199 mg/dL   Triglycerides 80 0 - 149 mg/dL   HDL 68 >40 mg/dL   VLDL Cholesterol Cal 14 5 - 40 mg/dL   LDL Chol Calc (NIH) 347 (H) 0 - 99 mg/dL  Hemoglobin Q2V     Status: None   Collection Time: 11/23/22 12:01 PM  Result Value Ref Range   Hgb A1c MFr Bld 5.6 4.8 - 5.6 %    Comment:          Prediabetes: 5.7 - 6.4          Diabetes: >6.4          Glycemic control for adults with diabetes: <7.0    Est. average glucose Bld gHb Est-mCnc 114 mg/dL      Assessment & Plan:  Continue current medications.  Set up orthopedic referral. Problem List Items Addressed This Visit     Lumbosacral radiculopathy   Chronic kidney disease   Hyperlipemia   Essential hypertension, benign - Primary   Other Visit Diagnoses     Right hip pain       Relevant Orders   Ambulatory referral to Orthopedic Surgery       Follow up as scheduled.  Total time spent: 30 minutes  Margaretann Loveless, MD  01/10/2023   This document may have been prepared by [facility] Voice Recognition software and as such may include unintentional dictation errors.

## 2023-01-21 ENCOUNTER — Other Ambulatory Visit: Payer: Self-pay | Admitting: Internal Medicine

## 2023-01-21 DIAGNOSIS — M5106 Intervertebral disc disorders with myelopathy, lumbar region: Secondary | ICD-10-CM

## 2023-02-12 ENCOUNTER — Ambulatory Visit: Payer: Medicare Other | Admitting: Internal Medicine

## 2023-02-13 ENCOUNTER — Other Ambulatory Visit: Payer: Self-pay | Admitting: Internal Medicine

## 2023-02-13 DIAGNOSIS — M5106 Intervertebral disc disorders with myelopathy, lumbar region: Secondary | ICD-10-CM

## 2023-02-15 ENCOUNTER — Ambulatory Visit (INDEPENDENT_AMBULATORY_CARE_PROVIDER_SITE_OTHER): Payer: Medicare Other | Admitting: Internal Medicine

## 2023-02-15 ENCOUNTER — Encounter: Payer: Self-pay | Admitting: Internal Medicine

## 2023-02-15 ENCOUNTER — Ambulatory Visit: Payer: Medicare Other | Admitting: Internal Medicine

## 2023-02-15 VITALS — BP 156/76 | HR 69 | Ht 64.0 in | Wt 142.0 lb

## 2023-02-15 DIAGNOSIS — M5417 Radiculopathy, lumbosacral region: Secondary | ICD-10-CM

## 2023-02-15 DIAGNOSIS — I10 Essential (primary) hypertension: Secondary | ICD-10-CM

## 2023-02-15 DIAGNOSIS — R7303 Prediabetes: Secondary | ICD-10-CM | POA: Insufficient documentation

## 2023-02-15 DIAGNOSIS — N1831 Chronic kidney disease, stage 3a: Secondary | ICD-10-CM | POA: Diagnosis not present

## 2023-02-15 NOTE — Progress Notes (Signed)
 Established Patient Office Visit  Subjective:  Patient ID: Anna Chambers, female    DOB: 1940/10/03  Age: 83 y.o. MRN: [id number]  Chief Complaint  Patient presents with   Follow-up    3 month follow up    Patient comes in for her follow-up today.  Her lower back pain is somewhat better today.  But her blood pressure is still very high.  Patient admits to eating salty food last night.  Although she has taken her blood pressure medicine this morning. Needs DMV forms filled out.  Advised to avoid salty food, monitor blood pressure at home.    No other concerns at this time.   Past Medical History:  Diagnosis Date   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HCC)    Chronic kidney disease 01/22/2019   Depression    Hypertension    Seizure (HCC) 02/25/2017   Sleep apnea    Syncope and collapse 02/25/2017    Past Surgical History:  Procedure Laterality Date   CATARACT EXTRACTION     CATARACT EXTRACTION W/PHACO Left 04/12/2021   Procedure: CATARACT EXTRACTION PHACO AND INTRAOCULAR LENS PLACEMENT (IOC) LEFT 8.12 01:13.1;  Surgeon: Mittie Gaskin, MD;  Location: [facility];  Service: Ophthalmology;  Laterality: Left;   COLONOSCOPY     COLONOSCOPY WITH PROPOFOL  N/A 12/04/2018   Procedure: COLONOSCOPY WITH PROPOFOL ;  Surgeon: Toledo, Ladell POUR, MD;  Location: [facility];  Service: Gastroenterology;  Laterality: N/A;   EYE SURGERY     FOOT SURGERY Left    NECK SURGERY     SPINE SURGERY     ACDF C3-4   VAGINAL HYSTERECTOMY      Social History   Socioeconomic History   Marital status: Married    Spouse name: Not on file   Number of children: 1   Years of education: Not on file   Highest education level: Not on file  Occupational History   Occupation: retired  Tobacco Use   Smoking status: Never   Smokeless tobacco: Never  Vaping Use   Vaping status: Never Used  Substance and Sexual Activity   Alcohol use: No    Alcohol/week: 0.0 standard drinks  of alcohol   Drug use: No   Sexual activity: Not on file  Other Topics Concern   Not on file  Social History Narrative   Lives with husband in a one story home.  Has one child.     Retired from  [employer] and [employer].     Right handed   Social Drivers of Health   Financial Resource Strain: Not on file  Food Insecurity: No Food Insecurity (11/07/2022)   Hunger Vital Sign    Worried About Running Out of Food in the Last Year: Never true    Ran Out of Food in the Last Year: Never true  Transportation Needs: No Transportation Needs (11/07/2022)   PRAPARE - Administrator, Civil Service (Medical): No    Lack of Transportation (Non-Medical): No  Physical Activity: Not on file  Stress: Not on file  Social Connections: Not on file  Intimate Partner Violence: Not At Risk (03/01/2022)   Humiliation, Afraid, Rape, and Kick questionnaire    Fear of Current or Ex-Partner: No    Emotionally Abused: No    Physically Abused: No    Sexually Abused: No    Family History  Problem Relation Age of Onset   Hypertension Mother    Kidney failure Mother    Heart disease Mother  Hypertension Father    Cancer Father    Hypertension Sister    Hypertension Sister    Healthy Son    Breast cancer Neg Hx     Allergies  Allergen Reactions   Simvastatin Rash    Outpatient Medications Prior to Visit  Medication Sig   allopurinol  (ZYLOPRIM ) 100 MG tablet TAKE 1 TABLET BY MOUTH EVERY DAY   aspirin  81 MG tablet Take 81 mg by mouth 2 (two) times a week.   baclofen  (LIORESAL ) 10 MG tablet TAKE 1 TABLET BY MOUTH EVERYDAY AT BEDTIME   brimonidine -timolol  (COMBIGAN ) 0.2-0.5 % ophthalmic solution Administer 10 drops to both eyes Two (2) times a day. Frequency:PHARMDIR   Dosage:33ml     Instructions:  Note:Instill one drop in each eye twice daily. Dose: 1   brinzolamide  (AZOPT ) 1 % ophthalmic suspension Administer 1 drop to both eyes Two (2) times a day (at 8am and 5pm). Frequency:PHARMDIR   Dosage:0.0      Instructions:  Note:Instill one drop in each eye twice a day. Dose: 1   Cholecalciferol  (VITAMIN D3) 1.25 MG (50000 UT) CAPS TAKE ONE CAPSULE BY MOUTH ONCE A WEEK   gabapentin  (NEURONTIN ) 100 MG capsule Take 1 capsule (100 mg total) by mouth 2 (two) times daily.   latanoprost  (XALATAN ) 0.005 % ophthalmic solution Administer 2.5 mL to both eyes nightly. Frequency:QD   Dosage:0.0     Instructions:  Note:Dose: 0.005 %   levETIRAcetam  (KEPPRA ) 500 MG tablet TAKE 1 TABLET BY MOUTH TWICE A DAY   losartan  (COZAAR ) 50 MG tablet Take 50 mg by mouth daily.   meloxicam  (MOBIC ) 7.5 MG tablet Take 1 tablet (7.5 mg total) by mouth daily.   ofloxacin (OCUFLOX) 0.3 % ophthalmic solution Place 1 drop into the left eye every 4 (four) hours.   oxybutynin  (DITROPAN -XL) 5 MG 24 hr tablet TAKE 1 TABLET BY MOUTH EVERY DAY   prednisoLONE  acetate (PRED FORTE ) 1 % ophthalmic suspension Place 1 drop into the left eye 4 (four) times daily.   ROCKLATAN 0.02-0.005 % SOLN Place 1 drop into both eyes daily.   torsemide (DEMADEX) 10 MG tablet Take 10 mg by mouth daily. Take Monday, Wednesday, and Friday   No facility-administered medications prior to visit.    Review of Systems  Constitutional: Negative.  Negative for chills, diaphoresis, fever, malaise/fatigue and weight loss.  HENT: Negative.  Negative for congestion, ear discharge and nosebleeds.   Eyes: Negative.   Respiratory: Negative.  Negative for cough and shortness of breath.   Cardiovascular: Negative.  Negative for chest pain, palpitations and leg swelling.  Gastrointestinal: Negative.  Negative for abdominal pain, constipation, diarrhea, heartburn, nausea and vomiting.  Genitourinary: Negative.  Negative for dysuria and flank pain.  Musculoskeletal: Negative.  Negative for joint pain and myalgias.  Skin: Negative.   Neurological: Negative.  Negative for dizziness, tingling, tremors and headaches.  Endo/Heme/Allergies: Negative.   Psychiatric/Behavioral:  Negative.  Negative for depression and suicidal ideas. The patient is not nervous/anxious.        Objective:   BP (!) 156/76   Pulse 69   Ht 5' 4 (1.626 m)   Wt 142 lb (64.4 kg)   LMP  (LMP Unknown)   SpO2 99%   BMI 24.37 kg/m   Vitals:   02/15/23 1145  BP: (!) 156/76  Pulse: 69  Height: 5' 4 (1.626 m)  Weight: 142 lb (64.4 kg)  SpO2: 99%  BMI (Calculated): 24.36    Physical Exam Vitals and nursing  note reviewed.  Constitutional:      Appearance: Normal appearance.  HENT:     Head: Normocephalic and atraumatic.     Nose: Nose normal.     Mouth/Throat:     Mouth: Mucous membranes are moist.     Pharynx: Oropharynx is clear.  Eyes:     Conjunctiva/sclera: Conjunctivae normal.     Pupils: Pupils are equal, round, and reactive to light.  Cardiovascular:     Rate and Rhythm: Normal rate and regular rhythm.     Pulses: Normal pulses.     Heart sounds: Normal heart sounds. No murmur heard. Pulmonary:     Effort: Pulmonary effort is normal.     Breath sounds: Normal breath sounds. No wheezing.  Abdominal:     General: Bowel sounds are normal.     Palpations: Abdomen is soft.     Tenderness: There is no abdominal tenderness. There is no right CVA tenderness or left CVA tenderness.  Musculoskeletal:        General: Normal range of motion.     Cervical back: Normal range of motion.     Right lower leg: No edema.     Left lower leg: No edema.  Skin:    General: Skin is warm and dry.  Neurological:     General: No focal deficit present.     Mental Status: She is alert and oriented to person, place, and time.  Psychiatric:        Mood and Affect: Mood normal.        Behavior: Behavior normal.      No results found for any visits on 02/15/23.  Recent Results (from the past 2160 hours)  Uric acid     Status: None   Collection Time: 11/23/22 12:01 PM  Result Value Ref Range   Uric Acid 4.1 3.1 - 7.9 mg/dL    Comment:            Therapeutic target for gout  patients: <6.0  CBC with Diff     Status: None   Collection Time: 11/23/22 12:01 PM  Result Value Ref Range   WBC 6.4 3.4 - 10.8 x10E3/uL   RBC 4.24 3.77 - 5.28 x10E6/uL   Hemoglobin 11.3 11.1 - 15.9 g/dL   Hematocrit 64.3 65.9 - 46.6 %   MCV 84 79 - 97 fL   MCH 26.7 26.6 - 33.0 pg   MCHC 31.7 31.5 - 35.7 g/dL   RDW 85.4 88.2 - 84.5 %   Platelets 296 150 - 450 x10E3/uL   Neutrophils 52 Not Estab. %   Lymphs 35 Not Estab. %   Monocytes 9 Not Estab. %   Eos 4 Not Estab. %   Basos 0 Not Estab. %   Neutrophils Absolute 3.3 1.4 - 7.0 x10E3/uL   Lymphocytes Absolute 2.2 0.7 - 3.1 x10E3/uL   Monocytes Absolute 0.5 0.1 - 0.9 x10E3/uL   EOS (ABSOLUTE) 0.3 0.0 - 0.4 x10E3/uL   Basophils Absolute 0.0 0.0 - 0.2 x10E3/uL   Immature Granulocytes 0 Not Estab. %   Immature Grans (Abs) 0.0 0.0 - 0.1 x10E3/uL  CMP14+EGFR     Status: Abnormal   Collection Time: 11/23/22 12:01 PM  Result Value Ref Range   Glucose 92 70 - 99 mg/dL   BUN 9 8 - 27 mg/dL   Creatinine, Ser 8.91 (H) 0.57 - 1.00 mg/dL   eGFR 51 (L) >40 fO/fpw/8.26   BUN/Creatinine Ratio 8 (L) 12 - 28   Sodium  145 (H) 134 - 144 mmol/L   Potassium 4.0 3.5 - 5.2 mmol/L   Chloride 107 (H) 96 - 106 mmol/L   CO2 23 20 - 29 mmol/L   Calcium  9.4 8.7 - 10.3 mg/dL   Total Protein 6.5 6.0 - 8.5 g/dL   Albumin 4.2 3.7 - 4.7 g/dL   Globulin, Total 2.3 1.5 - 4.5 g/dL   Bilirubin Total 0.7 0.0 - 1.2 mg/dL   Alkaline Phosphatase 108 44 - 121 IU/L   AST 18 0 - 40 IU/L   ALT 13 0 - 32 IU/L  Lipid Panel w/o Chol/HDL Ratio     Status: Abnormal   Collection Time: 11/23/22 12:01 PM  Result Value Ref Range   Cholesterol, Total 206 (H) 100 - 199 mg/dL   Triglycerides 80 0 - 149 mg/dL   HDL 68 >60 mg/dL   VLDL Cholesterol Cal 14 5 - 40 mg/dL   LDL Chol Calc (NIH) 875 (H) 0 - 99 mg/dL  Hemoglobin J8r     Status: None   Collection Time: 11/23/22 12:01 PM  Result Value Ref Range   Hgb A1c MFr Bld 5.6 4.8 - 5.6 %    Comment:          Prediabetes:  5.7 - 6.4          Diabetes: >6.4          Glycemic control for adults with diabetes: <7.0    Est. average glucose Bld gHb Est-mCnc 114 mg/dL      Assessment & Plan:  Patient advised strict diet control.  Monitor blood pressure at home.  Continue current medications. Problem List Items Addressed This Visit     Lumbosacral radiculopathy   Chronic kidney disease   Essential hypertension, benign - Primary   Prediabetes    Return in about 2 weeks (around 03/01/2023).   Total time spent: 30 minutes  FERNAND FREDY RAMAN, MD  02/15/2023   This document may have been prepared by [employer] Voice Recognition software and as such may include unintentional dictation errors.

## 2023-02-22 ENCOUNTER — Telehealth: Payer: Self-pay | Admitting: Internal Medicine

## 2023-02-22 NOTE — Telephone Encounter (Signed)
Patient left VM to see if Dr. Welton Flakes has any paperwork for her.   Called patient back to see what kind of paperwork she was referring to and she is talking about the [facility] paperwork she dropped off last week. Is this ready?

## 2023-02-25 ENCOUNTER — Ambulatory Visit: Payer: Medicare Other | Admitting: Internal Medicine

## 2023-03-01 ENCOUNTER — Ambulatory Visit (INDEPENDENT_AMBULATORY_CARE_PROVIDER_SITE_OTHER): Payer: Medicare Other | Admitting: Internal Medicine

## 2023-03-01 ENCOUNTER — Encounter: Payer: Self-pay | Admitting: Internal Medicine

## 2023-03-01 VITALS — BP 162/78 | HR 64 | Ht 64.0 in | Wt 141.4 lb

## 2023-03-01 DIAGNOSIS — E782 Mixed hyperlipidemia: Secondary | ICD-10-CM | POA: Diagnosis not present

## 2023-03-01 DIAGNOSIS — N1831 Chronic kidney disease, stage 3a: Secondary | ICD-10-CM

## 2023-03-01 DIAGNOSIS — I10 Essential (primary) hypertension: Secondary | ICD-10-CM | POA: Diagnosis not present

## 2023-03-01 DIAGNOSIS — M1A39X0 Chronic gout due to renal impairment, multiple sites, without tophus (tophi): Secondary | ICD-10-CM

## 2023-03-01 DIAGNOSIS — R7303 Prediabetes: Secondary | ICD-10-CM | POA: Diagnosis not present

## 2023-03-01 NOTE — Progress Notes (Signed)
Established Patient Office Visit  Subjective:  Patient ID: Anna Chambers, female    DOB: 05/11/1940  Age: 83 y.o. MRN: [id number]  Chief Complaint  Patient presents with   Follow-up    2 week follow up    Patient comes in for her blood pressure follow-up.  It is still high even though she took 2 tablets of losartan 50 mg for the first time this morning.  She does admit to sprinkling her food with salt.  Patient has been at advised to avoid salt and drink lots of water. Will continue losartan at 100 mg/day and monitor blood pressure.  Denies any headaches or dizziness, no chest pain and no shortness of breath.    No other concerns at this time.   Past Medical History:  Diagnosis Date   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HCC)    Chronic kidney disease 01/22/2019   Depression    Hypertension    Seizure (HCC) 02/25/2017   Sleep apnea    Syncope and collapse 02/25/2017    Past Surgical History:  Procedure Laterality Date   CATARACT EXTRACTION     CATARACT EXTRACTION W/PHACO Left 04/12/2021   Procedure: CATARACT EXTRACTION PHACO AND INTRAOCULAR LENS PLACEMENT (IOC) LEFT 8.12 01:13.1;  Surgeon: Lockie Mola, MD;  Location: [facility];  Service: Ophthalmology;  Laterality: Left;   COLONOSCOPY     COLONOSCOPY WITH PROPOFOL N/A 12/04/2018   Procedure: COLONOSCOPY WITH PROPOFOL;  Surgeon: Toledo, Boykin Nearing, MD;  Location: [facility];  Service: Gastroenterology;  Laterality: N/A;   EYE SURGERY     FOOT SURGERY Left    NECK SURGERY     SPINE SURGERY     ACDF C3-4   VAGINAL HYSTERECTOMY      Social History   Socioeconomic History   Marital status: Married    Spouse name: Not on file   Number of children: 1   Years of education: Not on file   Highest education level: Not on file  Occupational History   Occupation: retired  Tobacco Use   Smoking status: Never   Smokeless tobacco: Never  Vaping Use   Vaping status: Never Used  Substance and  Sexual Activity   Alcohol use: No    Alcohol/week: 0.0 standard drinks of alcohol   Drug use: No   Sexual activity: Not on file  Other Topics Concern   Not on file  Social History Narrative   Lives with husband in a one story home.  Has one child.     Retired from  [employer] and [employer].     Right handed   Social Drivers of Health   Financial Resource Strain: Not on file  Food Insecurity: No Food Insecurity (11/07/2022)   Hunger Vital Sign    Worried About Running Out of Food in the Last Year: Never true    Ran Out of Food in the Last Year: Never true  Transportation Needs: No Transportation Needs (11/07/2022)   PRAPARE - Administrator, Civil Service (Medical): No    Lack of Transportation (Non-Medical): No  Physical Activity: Not on file  Stress: Not on file  Social Connections: Not on file  Intimate Partner Violence: Not At Risk (03/01/2022)   Humiliation, Afraid, Rape, and Kick questionnaire    Fear of Current or Ex-Partner: No    Emotionally Abused: No    Physically Abused: No    Sexually Abused: No    Family History  Problem Relation Age of Onset  Hypertension Mother    Kidney failure Mother    Heart disease Mother    Hypertension Father    Cancer Father    Hypertension Sister    Hypertension Sister    Healthy Son    Breast cancer Neg Hx     Allergies  Allergen Reactions   Simvastatin Rash    Outpatient Medications Prior to Visit  Medication Sig   allopurinol (ZYLOPRIM) 100 MG tablet TAKE 1 TABLET BY MOUTH EVERY DAY   aspirin 81 MG tablet Take 81 mg by mouth 2 (two) times a week.   baclofen (LIORESAL) 10 MG tablet TAKE 1 TABLET BY MOUTH EVERYDAY AT BEDTIME   brimonidine-timolol (COMBIGAN) 0.2-0.5 % ophthalmic solution Administer 10 drops to both eyes Two (2) times a day. Frequency:PHARMDIR   Dosage:84ml     Instructions:  Note:Instill one drop in each eye twice daily. Dose: 1   brinzolamide (AZOPT) 1 % ophthalmic suspension Administer 1 drop to both  eyes Two (2) times a day (at 8am and 5pm). Frequency:PHARMDIR   Dosage:0.0     Instructions:  Note:Instill one drop in each eye twice a day. Dose: 1   gabapentin (NEURONTIN) 100 MG capsule Take 1 capsule (100 mg total) by mouth 2 (two) times daily.   latanoprost (XALATAN) 0.005 % ophthalmic solution Administer 2.5 mL to both eyes nightly. Frequency:QD   Dosage:0.0     Instructions:  Note:Dose: 0.005 %   levETIRAcetam (KEPPRA) 500 MG tablet TAKE 1 TABLET BY MOUTH TWICE A DAY   losartan (COZAAR) 50 MG tablet Take 50 mg by mouth daily.   meloxicam (MOBIC) 7.5 MG tablet Take 1 tablet (7.5 mg total) by mouth daily.   oxybutynin (DITROPAN-XL) 5 MG 24 hr tablet TAKE 1 TABLET BY MOUTH EVERY DAY   prednisoLONE acetate (PRED FORTE) 1 % ophthalmic suspension Place 1 drop into the left eye 4 (four) times daily.   ROCKLATAN 0.02-0.005 % SOLN Place 1 drop into both eyes daily.   Cholecalciferol (VITAMIN D3) 1.25 MG (50000 UT) CAPS TAKE ONE CAPSULE BY MOUTH ONCE A WEEK (Patient not taking: Reported on 03/01/2023)   ofloxacin (OCUFLOX) 0.3 % ophthalmic solution Place 1 drop into the left eye every 4 (four) hours. (Patient not taking: Reported on 03/01/2023)   torsemide (DEMADEX) 10 MG tablet Take 10 mg by mouth daily. Take Monday, Wednesday, and Friday (Patient not taking: Reported on 03/01/2023)   No facility-administered medications prior to visit.    Review of Systems  Constitutional: Negative.  Negative for chills, fever, malaise/fatigue and weight loss.  HENT: Negative.    Eyes: Negative.   Respiratory: Negative.  Negative for cough and shortness of breath.   Cardiovascular: Negative.  Negative for chest pain, palpitations and leg swelling.  Gastrointestinal: Negative.  Negative for abdominal pain, constipation, diarrhea, heartburn, nausea and vomiting.  Genitourinary: Negative.  Negative for dysuria and flank pain.  Musculoskeletal: Negative.  Negative for joint pain and myalgias.  Skin: Negative.    Neurological: Negative.  Negative for dizziness, tingling, tremors, sensory change and headaches.  Endo/Heme/Allergies: Negative.   Psychiatric/Behavioral: Negative.  Negative for depression and suicidal ideas. The patient is not nervous/anxious.        Objective:   BP (!) 162/78   Pulse 64   Ht 5\' 4"  (1.626 m)   Wt 141 lb 6.4 oz (64.1 kg)   LMP  (LMP Unknown)   SpO2 99%   BMI 24.27 kg/m   Vitals:   03/01/23 0945  BP: Marland Kitchen)  162/78  Pulse: 64  Height: 5\' 4"  (1.626 m)  Weight: 141 lb 6.4 oz (64.1 kg)  SpO2: 99%  BMI (Calculated): 24.26    Physical Exam Vitals and nursing note reviewed.  Constitutional:      Appearance: Normal appearance.  HENT:     Head: Normocephalic and atraumatic.     Nose: Nose normal.     Mouth/Throat:     Mouth: Mucous membranes are moist.     Pharynx: Oropharynx is clear.  Eyes:     Conjunctiva/sclera: Conjunctivae normal.     Pupils: Pupils are equal, round, and reactive to light.  Cardiovascular:     Rate and Rhythm: Normal rate and regular rhythm.     Pulses: Normal pulses.     Heart sounds: Normal heart sounds. No murmur heard. Pulmonary:     Effort: Pulmonary effort is normal.     Breath sounds: Normal breath sounds. No wheezing.  Abdominal:     General: Bowel sounds are normal.     Palpations: Abdomen is soft.     Tenderness: There is no abdominal tenderness. There is no right CVA tenderness or left CVA tenderness.  Musculoskeletal:        General: Normal range of motion.     Cervical back: Normal range of motion.     Right lower leg: No edema.     Left lower leg: No edema.  Skin:    General: Skin is warm and dry.  Neurological:     General: No focal deficit present.     Mental Status: She is alert and oriented to person, place, and time.  Psychiatric:        Mood and Affect: Mood normal.        Behavior: Behavior normal.      No results found for any visits on 03/01/23.  No results found for this or any previous visit  (from the past 2160 hours).    Assessment & Plan:  Continues losartan at 100 mg/day.  Continue to monitor blood pressure.  May need to add Norvasc at follow-up if still high. Fasting labs today. Problem List Items Addressed This Visit     Chronic kidney disease   Hyperlipemia   Relevant Orders   Lipid Panel w/o Chol/HDL Ratio   Essential hypertension, benign - Primary   Relevant Orders   CMP14+EGFR   Gout   Relevant Orders   CBC with Diff   Uric acid   Prediabetes   Relevant Orders   Hemoglobin A1c    Follow up 2 weeks.  Total time spent: 30 minutes  Margaretann Loveless, MD  03/01/2023   This document may have been prepared by [facility] Voice Recognition software and as such may include unintentional dictation errors.

## 2023-03-02 ENCOUNTER — Other Ambulatory Visit: Payer: Self-pay | Admitting: Internal Medicine

## 2023-03-02 LAB — URIC ACID: Uric Acid: 4.1 mg/dL (ref 3.1–7.9)

## 2023-03-02 LAB — CBC WITH DIFFERENTIAL/PLATELET
Basophils Absolute: 0 10*3/uL (ref 0.0–0.2)
Basos: 1 %
EOS (ABSOLUTE): 0.3 10*3/uL (ref 0.0–0.4)
Eos: 4 %
Hematocrit: 36.4 % (ref 34.0–46.6)
Hemoglobin: 11.5 g/dL (ref 11.1–15.9)
Immature Grans (Abs): 0 10*3/uL (ref 0.0–0.1)
Immature Granulocytes: 0 %
Lymphocytes Absolute: 2.7 10*3/uL (ref 0.7–3.1)
Lymphs: 38 %
MCH: 27 pg (ref 26.6–33.0)
MCHC: 31.6 g/dL (ref 31.5–35.7)
MCV: 85 fL (ref 79–97)
Monocytes Absolute: 0.6 10*3/uL (ref 0.1–0.9)
Monocytes: 9 %
Neutrophils Absolute: 3.4 10*3/uL (ref 1.4–7.0)
Neutrophils: 48 %
Platelets: 315 10*3/uL (ref 150–450)
RBC: 4.26 x10E6/uL (ref 3.77–5.28)
RDW: 14.5 % (ref 11.7–15.4)
WBC: 7.1 10*3/uL (ref 3.4–10.8)

## 2023-03-02 LAB — CMP14+EGFR
ALT: 20 [IU]/L (ref 0–32)
AST: 20 [IU]/L (ref 0–40)
Albumin: 4.3 g/dL (ref 3.7–4.7)
Alkaline Phosphatase: 112 [IU]/L (ref 44–121)
BUN/Creatinine Ratio: 14 (ref 12–28)
BUN: 14 mg/dL (ref 8–27)
Bilirubin Total: 0.6 mg/dL (ref 0.0–1.2)
CO2: 22 mmol/L (ref 20–29)
Calcium: 9.8 mg/dL (ref 8.7–10.3)
Chloride: 108 mmol/L — ABNORMAL HIGH (ref 96–106)
Creatinine, Ser: 1.03 mg/dL — ABNORMAL HIGH (ref 0.57–1.00)
Globulin, Total: 2.2 g/dL (ref 1.5–4.5)
Glucose: 91 mg/dL (ref 70–99)
Potassium: 4 mmol/L (ref 3.5–5.2)
Sodium: 144 mmol/L (ref 134–144)
Total Protein: 6.5 g/dL (ref 6.0–8.5)
eGFR: 54 mL/min/{1.73_m2} — ABNORMAL LOW (ref >59)

## 2023-03-02 LAB — HEMOGLOBIN A1C
Est. average glucose Bld gHb Est-mCnc: 114 mg/dL
Hgb A1c MFr Bld: 5.6 % (ref 4.8–5.6)

## 2023-03-02 LAB — LIPID PANEL W/O CHOL/HDL RATIO
Cholesterol, Total: 221 mg/dL — ABNORMAL HIGH (ref 100–199)
HDL: 74 mg/dL (ref >39)
LDL Chol Calc (NIH): 134 mg/dL — ABNORMAL HIGH (ref 0–99)
Triglycerides: 75 mg/dL (ref 0–149)
VLDL Cholesterol Cal: 13 mg/dL (ref 5–40)

## 2023-03-05 NOTE — Progress Notes (Signed)
Patient notified

## 2023-03-11 ENCOUNTER — Ambulatory Visit (INDEPENDENT_AMBULATORY_CARE_PROVIDER_SITE_OTHER): Payer: Medicare Other | Admitting: Internal Medicine

## 2023-03-11 ENCOUNTER — Encounter: Payer: Self-pay | Admitting: Internal Medicine

## 2023-03-11 VITALS — BP 124/82 | HR 60 | Ht 64.0 in | Wt 144.6 lb

## 2023-03-11 DIAGNOSIS — I10 Essential (primary) hypertension: Secondary | ICD-10-CM | POA: Diagnosis not present

## 2023-03-11 DIAGNOSIS — R7303 Prediabetes: Secondary | ICD-10-CM | POA: Diagnosis not present

## 2023-03-11 DIAGNOSIS — N1831 Chronic kidney disease, stage 3a: Secondary | ICD-10-CM

## 2023-03-11 DIAGNOSIS — E782 Mixed hyperlipidemia: Secondary | ICD-10-CM

## 2023-03-11 MED ORDER — LOSARTAN POTASSIUM 100 MG PO TABS: 100.0000 mg | ORAL_TABLET | Freq: Every day | ORAL | 3 refills | Status: DC

## 2023-03-11 NOTE — Progress Notes (Signed)
Established Patient Office Visit  Subjective:  Patient ID: Anna Chambers, female    DOB: 05/13/1940  Age: 83 y.o. MRN: [id number]  Chief Complaint  Patient presents with   Follow-up    10 day follow up    Patient comes in for follow-up today.  Her blood pressure is looking much better.  She has stopped adding salt to her food, as well as taking losartan 100 mg/day.  Generally feels well and has no new complaints. Labs discussed again, LDL is up, will need to be monitored.  Her kidney function is stable.    No other concerns at this time.   Past Medical History:  Diagnosis Date   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HCC)    Chronic kidney disease 01/22/2019   Depression    Hypertension    Seizure (HCC) 02/25/2017   Sleep apnea    Syncope and collapse 02/25/2017    Past Surgical History:  Procedure Laterality Date   CATARACT EXTRACTION     CATARACT EXTRACTION W/PHACO Left 04/12/2021   Procedure: CATARACT EXTRACTION PHACO AND INTRAOCULAR LENS PLACEMENT (IOC) LEFT 8.12 01:13.1;  Surgeon: Lockie Mola, MD;  Location: [facility];  Service: Ophthalmology;  Laterality: Left;   COLONOSCOPY     COLONOSCOPY WITH PROPOFOL N/A 12/04/2018   Procedure: COLONOSCOPY WITH PROPOFOL;  Surgeon: Toledo, Boykin Nearing, MD;  Location: [facility];  Service: Gastroenterology;  Laterality: N/A;   EYE SURGERY     FOOT SURGERY Left    NECK SURGERY     SPINE SURGERY     ACDF C3-4   VAGINAL HYSTERECTOMY      Social History   Socioeconomic History   Marital status: Married    Spouse name: Not on file   Number of children: 1   Years of education: Not on file   Highest education level: Not on file  Occupational History   Occupation: retired  Tobacco Use   Smoking status: Never   Smokeless tobacco: Never  Vaping Use   Vaping status: Never Used  Substance and Sexual Activity   Alcohol use: No    Alcohol/week: 0.0 standard drinks of alcohol   Drug use: No    Sexual activity: Not on file  Other Topics Concern   Not on file  Social History Narrative   Lives with husband in a one story home.  Has one child.     Retired from  [employer] and [employer].     Right handed   Social Drivers of Health   Financial Resource Strain: Not on file  Food Insecurity: No Food Insecurity (11/07/2022)   Hunger Vital Sign    Worried About Running Out of Food in the Last Year: Never true    Ran Out of Food in the Last Year: Never true  Transportation Needs: No Transportation Needs (11/07/2022)   PRAPARE - Administrator, Civil Service (Medical): No    Lack of Transportation (Non-Medical): No  Physical Activity: Not on file  Stress: Not on file  Social Connections: Not on file  Intimate Partner Violence: Not At Risk (03/01/2022)   Humiliation, Afraid, Rape, and Kick questionnaire    Fear of Current or Ex-Partner: No    Emotionally Abused: No    Physically Abused: No    Sexually Abused: No    Family History  Problem Relation Age of Onset   Hypertension Mother    Kidney failure Mother    Heart disease Mother    Hypertension Father  Cancer Father    Hypertension Sister    Hypertension Sister    Healthy Son    Breast cancer Neg Hx     Allergies  Allergen Reactions   Simvastatin Rash    Outpatient Medications Prior to Visit  Medication Sig   allopurinol (ZYLOPRIM) 100 MG tablet TAKE 1 TABLET BY MOUTH EVERY DAY   aspirin 81 MG tablet Take 81 mg by mouth 2 (two) times a week.   baclofen (LIORESAL) 10 MG tablet TAKE 1 TABLET BY MOUTH EVERYDAY AT BEDTIME   brimonidine-timolol (COMBIGAN) 0.2-0.5 % ophthalmic solution Administer 10 drops to both eyes Two (2) times a day. Frequency:PHARMDIR   Dosage:9ml     Instructions:  Note:Instill one drop in each eye twice daily. Dose: 1   brinzolamide (AZOPT) 1 % ophthalmic suspension Administer 1 drop to both eyes Two (2) times a day (at 8am and 5pm). Frequency:PHARMDIR   Dosage:0.0     Instructions:   Note:Instill one drop in each eye twice a day. Dose: 1   Cholecalciferol (VITAMIN D3) 1.25 MG (50000 UT) CAPS TAKE ONE CAPSULE BY MOUTH ONCE A WEEK   gabapentin (NEURONTIN) 100 MG capsule Take 1 capsule (100 mg total) by mouth 2 (two) times daily.   latanoprost (XALATAN) 0.005 % ophthalmic solution Administer 2.5 mL to both eyes nightly. Frequency:QD   Dosage:0.0     Instructions:  Note:Dose: 0.005 %   levETIRAcetam (KEPPRA) 500 MG tablet TAKE 1 TABLET BY MOUTH TWICE A DAY   meloxicam (MOBIC) 7.5 MG tablet Take 1 tablet (7.5 mg total) by mouth daily.   ofloxacin (OCUFLOX) 0.3 % ophthalmic solution Place 1 drop into the left eye every 4 (four) hours.   oxybutynin (DITROPAN-XL) 5 MG 24 hr tablet TAKE 1 TABLET BY MOUTH EVERY DAY   prednisoLONE acetate (PRED FORTE) 1 % ophthalmic suspension Place 1 drop into the left eye 4 (four) times daily.   ROCKLATAN 0.02-0.005 % SOLN Place 1 drop into both eyes daily.   torsemide (DEMADEX) 10 MG tablet Take 10 mg by mouth daily. Take Monday, Wednesday, and Friday   [DISCONTINUED] losartan (COZAAR) 50 MG tablet TAKE 1 TABLET BY MOUTH EVERY DAY   [DISCONTINUED] losartan (COZAAR) 50 MG tablet Take 50 mg by mouth daily.   No facility-administered medications prior to visit.    Review of Systems  Constitutional: Negative.  Negative for chills, fever, malaise/fatigue and weight loss.  HENT: Negative.  Negative for congestion, sinus pain and sore throat.   Eyes: Negative.   Respiratory: Negative.  Negative for cough, shortness of breath and stridor.   Cardiovascular: Negative.  Negative for chest pain, palpitations and leg swelling.  Gastrointestinal: Negative.  Negative for abdominal pain, constipation, diarrhea, heartburn, nausea and vomiting.  Genitourinary: Negative.  Negative for dysuria and flank pain.  Musculoskeletal: Negative.  Negative for joint pain and myalgias.  Skin: Negative.   Neurological: Negative.  Negative for dizziness, tingling, tremors,  sensory change and headaches.  Endo/Heme/Allergies: Negative.   Psychiatric/Behavioral: Negative.  Negative for depression and suicidal ideas. The patient is not nervous/anxious.        Objective:   BP 124/82   Pulse 60   Ht 5\' 4"  (1.626 m)   Wt 144 lb 9.6 oz (65.6 kg)   LMP  (LMP Unknown)   SpO2 99%   BMI 24.82 kg/m   Vitals:   03/11/23 1013  BP: 124/82  Pulse: 60  Height: 5\' 4"  (1.626 m)  Weight: 144 lb 9.6 oz (65.6  kg)  SpO2: 99%  BMI (Calculated): 24.81    Physical Exam Vitals and nursing note reviewed.  Constitutional:      Appearance: Normal appearance.  HENT:     Head: Normocephalic and atraumatic.     Nose: Nose normal.     Mouth/Throat:     Mouth: Mucous membranes are moist.     Pharynx: Oropharynx is clear.  Eyes:     Conjunctiva/sclera: Conjunctivae normal.     Pupils: Pupils are equal, round, and reactive to light.  Cardiovascular:     Rate and Rhythm: Normal rate and regular rhythm.     Pulses: Normal pulses.     Heart sounds: Normal heart sounds. No murmur heard. Pulmonary:     Effort: Pulmonary effort is normal.     Breath sounds: Normal breath sounds. No wheezing.  Abdominal:     General: Bowel sounds are normal.     Palpations: Abdomen is soft.     Tenderness: There is no abdominal tenderness. There is no right CVA tenderness or left CVA tenderness.  Musculoskeletal:        General: Normal range of motion.     Cervical back: Normal range of motion.     Right lower leg: No edema.     Left lower leg: No edema.  Skin:    General: Skin is warm and dry.  Neurological:     General: No focal deficit present.     Mental Status: She is alert and oriented to person, place, and time.  Psychiatric:        Mood and Affect: Mood normal.        Behavior: Behavior normal.      No results found for any visits on 03/11/23.  Recent Results (from the past 2160 hours)  CBC with Diff     Status: None   Collection Time: 03/01/23 10:21 AM  Result  Value Ref Range   WBC 7.1 3.4 - 10.8 x10E3/uL   RBC 4.26 3.77 - 5.28 x10E6/uL   Hemoglobin 11.5 11.1 - 15.9 g/dL   Hematocrit 16.1 09.6 - 46.6 %   MCV 85 79 - 97 fL   MCH 27.0 26.6 - 33.0 pg   MCHC 31.6 31.5 - 35.7 g/dL   RDW 04.5 40.9 - 81.1 %   Platelets 315 150 - 450 x10E3/uL   Neutrophils 48 Not Estab. %   Lymphs 38 Not Estab. %   Monocytes 9 Not Estab. %   Eos 4 Not Estab. %   Basos 1 Not Estab. %   Neutrophils Absolute 3.4 1.4 - 7.0 x10E3/uL   Lymphocytes Absolute 2.7 0.7 - 3.1 x10E3/uL   Monocytes Absolute 0.6 0.1 - 0.9 x10E3/uL   EOS (ABSOLUTE) 0.3 0.0 - 0.4 x10E3/uL   Basophils Absolute 0.0 0.0 - 0.2 x10E3/uL   Immature Granulocytes 0 Not Estab. %   Immature Grans (Abs) 0.0 0.0 - 0.1 x10E3/uL  CMP14+EGFR     Status: Abnormal   Collection Time: 03/01/23 10:21 AM  Result Value Ref Range   Glucose 91 70 - 99 mg/dL   BUN 14 8 - 27 mg/dL   Creatinine, Ser 9.14 (H) 0.57 - 1.00 mg/dL   eGFR 54 (L) >78 GN/FAO/1.30   BUN/Creatinine Ratio 14 12 - 28   Sodium 144 134 - 144 mmol/L   Potassium 4.0 3.5 - 5.2 mmol/L   Chloride 108 (H) 96 - 106 mmol/L   CO2 22 20 - 29 mmol/L   Calcium 9.8 8.7 -  10.3 mg/dL   Total Protein 6.5 6.0 - 8.5 g/dL   Albumin 4.3 3.7 - 4.7 g/dL   Globulin, Total 2.2 1.5 - 4.5 g/dL   Bilirubin Total 0.6 0.0 - 1.2 mg/dL   Alkaline Phosphatase 112 44 - 121 IU/L   AST 20 0 - 40 IU/L   ALT 20 0 - 32 IU/L  Uric acid     Status: None   Collection Time: 03/01/23 10:21 AM  Result Value Ref Range   Uric Acid 4.1 3.1 - 7.9 mg/dL    Comment:            Therapeutic target for gout patients: <6.0  Lipid Panel w/o Chol/HDL Ratio     Status: Abnormal   Collection Time: 03/01/23 10:21 AM  Result Value Ref Range   Cholesterol, Total 221 (H) 100 - 199 mg/dL   Triglycerides 75 0 - 149 mg/dL   HDL 74 >16 mg/dL   VLDL Cholesterol Cal 13 5 - 40 mg/dL   LDL Chol Calc (NIH) 109 (H) 0 - 99 mg/dL  Hemoglobin U0A     Status: None   Collection Time: 03/01/23 10:21 AM   Result Value Ref Range   Hgb A1c MFr Bld 5.6 4.8 - 5.6 %    Comment:          Prediabetes: 5.7 - 6.4          Diabetes: >6.4          Glycemic control for adults with diabetes: <7.0    Est. average glucose Bld gHb Est-mCnc 114 mg/dL      Assessment & Plan:  Continue current medications, avoid salt. Problem List Items Addressed This Visit     Chronic kidney disease   Hyperlipemia   Relevant Medications   losartan (COZAAR) 100 MG tablet   Essential hypertension, benign - Primary   Relevant Medications   losartan (COZAAR) 100 MG tablet   Prediabetes    Return in about 3 months (around 06/08/2023).   Total time spent: 30 minutes  Margaretann Loveless, MD  03/11/2023   This document may have been prepared by [facility] Voice Recognition software and as such may include unintentional dictation errors.

## 2023-05-07 ENCOUNTER — Encounter: Payer: Self-pay | Admitting: Neurology

## 2023-05-07 ENCOUNTER — Ambulatory Visit (INDEPENDENT_AMBULATORY_CARE_PROVIDER_SITE_OTHER): Admitting: Neurology

## 2023-05-07 VITALS — BP 195/79 | HR 62 | Ht 64.0 in | Wt 147.0 lb

## 2023-05-07 DIAGNOSIS — Z981 Arthrodesis status: Secondary | ICD-10-CM | POA: Diagnosis not present

## 2023-05-07 DIAGNOSIS — H02401 Unspecified ptosis of right eyelid: Secondary | ICD-10-CM | POA: Diagnosis not present

## 2023-05-07 DIAGNOSIS — G40909 Epilepsy, unspecified, not intractable, without status epilepticus: Secondary | ICD-10-CM | POA: Diagnosis not present

## 2023-05-07 DIAGNOSIS — M5417 Radiculopathy, lumbosacral region: Secondary | ICD-10-CM | POA: Diagnosis not present

## 2023-05-07 MED ORDER — LEVETIRACETAM 500 MG PO TABS: 500.0000 mg | ORAL_TABLET | Freq: Two times a day (BID) | ORAL | 3 refills | Status: AC
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
## 2023-06-10 ENCOUNTER — Encounter: Payer: Self-pay | Admitting: Internal Medicine

## 2023-06-10 ENCOUNTER — Ambulatory Visit (INDEPENDENT_AMBULATORY_CARE_PROVIDER_SITE_OTHER): Payer: Medicare Other | Admitting: Internal Medicine

## 2023-06-10 VITALS — BP 114/72 | HR 58 | Ht 64.0 in | Wt 145.4 lb

## 2023-06-10 DIAGNOSIS — M5106 Intervertebral disc disorders with myelopathy, lumbar region: Secondary | ICD-10-CM

## 2023-06-10 DIAGNOSIS — M25551 Pain in right hip: Secondary | ICD-10-CM | POA: Diagnosis not present

## 2023-06-10 DIAGNOSIS — N1831 Chronic kidney disease, stage 3a: Secondary | ICD-10-CM

## 2023-06-10 DIAGNOSIS — I10 Essential (primary) hypertension: Secondary | ICD-10-CM | POA: Diagnosis not present

## 2023-06-10 DIAGNOSIS — E782 Mixed hyperlipidemia: Secondary | ICD-10-CM

## 2023-06-10 DIAGNOSIS — M1A39X0 Chronic gout due to renal impairment, multiple sites, without tophus (tophi): Secondary | ICD-10-CM

## 2023-06-10 DIAGNOSIS — R7303 Prediabetes: Secondary | ICD-10-CM

## 2023-06-10 MED ORDER — DICLOFENAC SODIUM 1 % EX GEL: 2.0000 g | Freq: Two times a day (BID) | CUTANEOUS | 3 refills | Status: AC

## 2023-06-10 MED ORDER — GABAPENTIN 100 MG PO CAPS: 100.0000 mg | ORAL_CAPSULE | Freq: Two times a day (BID) | ORAL | 2 refills | Status: DC

## 2023-06-10 MED ORDER — BACLOFEN 10 MG PO TABS: 10.0000 mg | ORAL_TABLET | Freq: Every day | ORAL | 1 refills | Status: AC

## 2023-06-10 NOTE — Progress Notes (Signed)
 Established Patient Office Visit  Subjective:  Patient ID: Anna Chambers, female    DOB: 1940/04/28  Age: 83 y.o. MRN: [id number]  Chief Complaint  Patient presents with   Follow-up    3 month follow up    Patient comes in for her follow-up today.  She is generally feeling well and her blood pressure is stable.  But recently she started having pain in her right hip which radiates down the front of the thigh.  She is occasionally stiff when he wakes up in the morning, but has more difficulty getting up from sitting position.  She is already taking Tylenol and rubbing Voltaren  gel.  She takes gabapentin  once a day instead of twice as prescribed.  She cannot take meloxicam  due to her kidney disease.  Will check her x-ray right hip, add baclofen  and She will continue to rub Voltaren  gel and to continue gabapentin . Denies chest pain, no nausea vomiting, no headaches or dizziness.    No other concerns at this time.   Past Medical History:  Diagnosis Date   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HCC)    Chronic kidney disease 01/22/2019   Depression    Hypertension    Seizure (HCC) 02/25/2017   Sleep apnea    Syncope and collapse 02/25/2017    Past Surgical History:  Procedure Laterality Date   CATARACT EXTRACTION     CATARACT EXTRACTION W/PHACO Left 04/12/2021   Procedure: CATARACT EXTRACTION PHACO AND INTRAOCULAR LENS PLACEMENT (IOC) LEFT 8.12 01:13.1;  Surgeon: Annell Kidney, MD;  Location: [facility];  Service: Ophthalmology;  Laterality: Left;   COLONOSCOPY     COLONOSCOPY WITH PROPOFOL  N/A 12/04/2018   Procedure: COLONOSCOPY WITH PROPOFOL ;  Surgeon: Toledo, Alphonsus Jeans, MD;  Location: [facility];  Service: Gastroenterology;  Laterality: N/A;   EYE SURGERY     FOOT SURGERY Left    NECK SURGERY     SPINE SURGERY     ACDF C3-4   VAGINAL HYSTERECTOMY      Social History   Socioeconomic History   Marital status: Married    Spouse name: Not on file    Number of children: 1   Years of education: Not on file   Highest education level: Not on file  Occupational History   Occupation: retired  Tobacco Use   Smoking status: Never   Smokeless tobacco: Never  Vaping Use   Vaping status: Never Used  Substance and Sexual Activity   Alcohol use: No    Alcohol/week: 0.0 standard drinks of alcohol   Drug use: No   Sexual activity: Not on file  Other Topics Concern   Not on file  Social History Narrative   Lives with husband in a one story home.  Has one child.     Retired from  [employer] and [employer].     Right handed   Social Drivers of Health   Financial Resource Strain: Not on file  Food Insecurity: No Food Insecurity (11/07/2022)   Hunger Vital Sign    Worried About Running Out of Food in the Last Year: Never true    Ran Out of Food in the Last Year: Never true  Transportation Needs: No Transportation Needs (11/07/2022)   PRAPARE - Administrator, Civil Service (Medical): No    Lack of Transportation (Non-Medical): No  Physical Activity: Not on file  Stress: Not on file  Social Connections: Not on file  Intimate Partner Violence: Not At Risk (03/01/2022)  Humiliation, Afraid, Rape, and Kick questionnaire    Fear of Current or Ex-Partner: No    Emotionally Abused: No    Physically Abused: No    Sexually Abused: No    Family History  Problem Relation Age of Onset   Hypertension Mother    Kidney failure Mother    Heart disease Mother    Hypertension Father    Cancer Father    Hypertension Sister    Hypertension Sister    Healthy Son    Breast cancer Neg Hx     Allergies  Allergen Reactions   Simvastatin Rash    Outpatient Medications Prior to Visit  Medication Sig   allopurinol  (ZYLOPRIM ) 100 MG tablet TAKE 1 TABLET BY MOUTH EVERY DAY   brimonidine -timolol  (COMBIGAN ) 0.2-0.5 % ophthalmic solution Administer 10 drops to both eyes Two (2) times a day. Frequency:PHARMDIR   Dosage:29ml     Instructions:   Note:Instill one drop in each eye twice daily. Dose: 1   brinzolamide  (AZOPT ) 1 % ophthalmic suspension Administer 1 drop to both eyes Two (2) times a day (at 8am and 5pm). Frequency:PHARMDIR   Dosage:0.0     Instructions:  Note:Instill one drop in each eye twice a day. Dose: 1   Cholecalciferol  (VITAMIN D3) 1.25 MG (50000 UT) CAPS TAKE ONE CAPSULE BY MOUTH ONCE A WEEK   latanoprost  (XALATAN ) 0.005 % ophthalmic solution Administer 2.5 mL to both eyes nightly. Frequency:QD   Dosage:0.0     Instructions:  Note:Dose: 0.005 %   levETIRAcetam  (KEPPRA ) 500 MG tablet Take 1 tablet (500 mg total) by mouth 2 (two) times daily.   losartan  (COZAAR ) 100 MG tablet Take 1 tablet (100 mg total) by mouth daily.   oxybutynin  (DITROPAN -XL) 5 MG 24 hr tablet TAKE 1 TABLET BY MOUTH EVERY DAY   prednisoLONE  acetate (PRED FORTE ) 1 % ophthalmic suspension Place 1 drop into the left eye 4 (four) times daily.   [DISCONTINUED] gabapentin  (NEURONTIN ) 100 MG capsule Take 1 capsule (100 mg total) by mouth 2 (two) times daily. (Patient taking differently: Take 100 mg by mouth 2 (two) times daily. Takes as needed)   torsemide (DEMADEX) 10 MG tablet Take 10 mg by mouth daily. Take Monday, Wednesday, and Friday (Patient not taking: Reported on 06/10/2023)   [DISCONTINUED] aspirin  81 MG tablet Take 81 mg by mouth 2 (two) times a week. (Patient not taking: Reported on 06/10/2023)   [DISCONTINUED] baclofen  (LIORESAL ) 10 MG tablet TAKE 1 TABLET BY MOUTH EVERYDAY AT BEDTIME (Patient not taking: Reported on 06/10/2023)   [DISCONTINUED] meloxicam  (MOBIC ) 7.5 MG tablet Take 1 tablet (7.5 mg total) by mouth daily. (Patient not taking: Reported on 06/10/2023)   [DISCONTINUED] ofloxacin (OCUFLOX) 0.3 % ophthalmic solution Place 1 drop into the left eye every 4 (four) hours. (Patient not taking: Reported on 06/10/2023)   [DISCONTINUED] ROCKLATAN 0.02-0.005 % SOLN Place 1 drop into both eyes daily. (Patient not taking: Reported on 06/10/2023)   No  facility-administered medications prior to visit.    Review of Systems  Constitutional: Negative.  Negative for chills, fever and weight loss.  HENT: Negative.  Negative for sore throat.   Eyes: Negative.   Respiratory: Negative.  Negative for cough and shortness of breath.   Cardiovascular: Negative.  Negative for chest pain, palpitations and leg swelling.  Gastrointestinal: Negative.  Negative for abdominal pain, constipation, diarrhea, heartburn, nausea and vomiting.  Genitourinary: Negative.  Negative for dysuria and flank pain.  Musculoskeletal:  Positive for joint pain. Negative for  back pain, myalgias and neck pain.  Skin: Negative.   Neurological: Negative.  Negative for dizziness, tingling, tremors and headaches.  Endo/Heme/Allergies: Negative.   Psychiatric/Behavioral: Negative.  Negative for depression and suicidal ideas. The patient is not nervous/anxious.        Objective:   BP 114/72   Pulse (!) 58   Ht 5\' 4"  (1.626 m)   Wt 145 lb 6.4 oz (66 kg)   LMP  (LMP Unknown)   SpO2 98%   BMI 24.96 kg/m   Vitals:   06/10/23 1039  BP: 114/72  Pulse: (!) 58  Height: 5\' 4"  (1.626 m)  Weight: 145 lb 6.4 oz (66 kg)  SpO2: 98%  BMI (Calculated): 24.95    Physical Exam   No results found for any visits on 06/10/23.  No results found for this or any previous visit (from the past 2160 hours).    Assessment & Plan:  X-ray right hip.  To take medications as advised. Will check labs at next visit. Problem List Items Addressed This Visit     Chronic kidney disease   Hyperlipemia   Essential hypertension, benign - Primary   Lumbar disc disorder with myelopathy   Relevant Medications   gabapentin  (NEURONTIN ) 100 MG capsule   baclofen  (LIORESAL ) 10 MG tablet   Gout   Prediabetes   Other Visit Diagnoses       Right hip pain       Relevant Medications   diclofenac  Sodium (VOLTAREN ) 1 % GEL   Other Relevant Orders   DG HIP UNILAT W OR W/O PELVIS 1V RIGHT        Return in about 2 weeks (around 06/24/2023).   Total time spent: 30 minutes  Aisha Hove, MD  06/10/2023   This document may have been prepared by [facility] Voice Recognition software and as such may include unintentional dictation errors.

## 2023-06-11 ENCOUNTER — Ambulatory Visit (INDEPENDENT_AMBULATORY_CARE_PROVIDER_SITE_OTHER)

## 2023-06-11 DIAGNOSIS — M25551 Pain in right hip: Secondary | ICD-10-CM | POA: Diagnosis not present

## 2023-06-20 ENCOUNTER — Other Ambulatory Visit

## 2023-06-20 DIAGNOSIS — E782 Mixed hyperlipidemia: Secondary | ICD-10-CM

## 2023-06-20 DIAGNOSIS — I10 Essential (primary) hypertension: Secondary | ICD-10-CM

## 2023-06-21 ENCOUNTER — Ambulatory Visit: Payer: Self-pay | Admitting: Internal Medicine

## 2023-06-21 LAB — LIPID PANEL
Chol/HDL Ratio: 3 ratio (ref 0.0–4.4)
Cholesterol, Total: 208 mg/dL — ABNORMAL HIGH (ref 100–199)
HDL: 70 mg/dL (ref >39)
LDL Chol Calc (NIH): 124 mg/dL — ABNORMAL HIGH (ref 0–99)
Triglycerides: 78 mg/dL (ref 0–149)
VLDL Cholesterol Cal: 14 mg/dL (ref 5–40)

## 2023-06-21 LAB — CBC WITH DIFF/PLATELET
Basophils Absolute: 0 10*3/uL (ref 0.0–0.2)
Basos: 1 %
EOS (ABSOLUTE): 0.2 10*3/uL (ref 0.0–0.4)
Eos: 4 %
Hematocrit: 37.2 % (ref 34.0–46.6)
Hemoglobin: 11.4 g/dL (ref 11.1–15.9)
Immature Grans (Abs): 0 10*3/uL (ref 0.0–0.1)
Immature Granulocytes: 0 %
Lymphocytes Absolute: 2.4 10*3/uL (ref 0.7–3.1)
Lymphs: 38 %
MCH: 26.6 pg (ref 26.6–33.0)
MCHC: 30.6 g/dL — ABNORMAL LOW (ref 31.5–35.7)
MCV: 87 fL (ref 79–97)
Monocytes Absolute: 0.6 10*3/uL (ref 0.1–0.9)
Monocytes: 9 %
Neutrophils Absolute: 3.1 10*3/uL (ref 1.4–7.0)
Neutrophils: 48 %
Platelets: 309 10*3/uL (ref 150–450)
RBC: 4.29 x10E6/uL (ref 3.77–5.28)
RDW: 14.4 % (ref 11.7–15.4)
WBC: 6.4 10*3/uL (ref 3.4–10.8)

## 2023-06-24 ENCOUNTER — Ambulatory Visit: Admitting: Internal Medicine

## 2023-06-24 ENCOUNTER — Encounter: Payer: Self-pay | Admitting: Internal Medicine

## 2023-06-24 VITALS — BP 132/76 | HR 62 | Ht 64.0 in | Wt 146.2 lb

## 2023-06-24 DIAGNOSIS — E782 Mixed hyperlipidemia: Secondary | ICD-10-CM | POA: Diagnosis not present

## 2023-06-24 DIAGNOSIS — N1831 Chronic kidney disease, stage 3a: Secondary | ICD-10-CM

## 2023-06-24 DIAGNOSIS — M5106 Intervertebral disc disorders with myelopathy, lumbar region: Secondary | ICD-10-CM

## 2023-06-24 DIAGNOSIS — M1A39X0 Chronic gout due to renal impairment, multiple sites, without tophus (tophi): Secondary | ICD-10-CM

## 2023-06-24 DIAGNOSIS — M7061 Trochanteric bursitis, right hip: Secondary | ICD-10-CM | POA: Diagnosis not present

## 2023-06-24 DIAGNOSIS — R7303 Prediabetes: Secondary | ICD-10-CM

## 2023-06-24 DIAGNOSIS — I10 Essential (primary) hypertension: Secondary | ICD-10-CM

## 2023-06-24 MED ORDER — PREDNISONE 20 MG PO TABS
40.0000 mg | ORAL_TABLET | Freq: Every day | ORAL | 0 refills | Status: DC
Start: 2023-06-24 — End: 2023-08-20

## 2023-06-24 NOTE — Progress Notes (Signed)
 Established Patient Office Visit  Subjective:  Patient ID: Anna Chambers, female    DOB: 07/04/1940  Age: 83 y.o. MRN: [id number]  Chief Complaint  Patient presents with   Follow-up    2 week follow up    Patient comes in for her follow-up today.  She continues to have right hip discomfort with stiffness and pain especially in the morning.  Patient had used local Voltaren  gel as well as gabapentin  but continues to feel the discomfort.  Her x-ray showed minimal arthritis but more likely bursitis.  Will give a trial of prednisone  burst. Denies nausea or vomiting, no headaches and no dizziness.  No palpitations.    No other concerns at this time.   Past Medical History:  Diagnosis Date   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HCC)    Chronic kidney disease 01/22/2019   Depression    Hypertension    Seizure (HCC) 02/25/2017   Sleep apnea    Syncope and collapse 02/25/2017    Past Surgical History:  Procedure Laterality Date   CATARACT EXTRACTION     CATARACT EXTRACTION W/PHACO Left 04/12/2021   Procedure: CATARACT EXTRACTION PHACO AND INTRAOCULAR LENS PLACEMENT (IOC) LEFT 8.12 01:13.1;  Surgeon: Annell Kidney, MD;  Location: [facility];  Service: Ophthalmology;  Laterality: Left;   COLONOSCOPY     COLONOSCOPY WITH PROPOFOL  N/A 12/04/2018   Procedure: COLONOSCOPY WITH PROPOFOL ;  Surgeon: Toledo, Alphonsus Jeans, MD;  Location: [facility];  Service: Gastroenterology;  Laterality: N/A;   EYE SURGERY     FOOT SURGERY Left    NECK SURGERY     SPINE SURGERY     ACDF C3-4   VAGINAL HYSTERECTOMY      Social History   Socioeconomic History   Marital status: Married    Spouse name: Not on file   Number of children: 1   Years of education: Not on file   Highest education level: Not on file  Occupational History   Occupation: retired  Tobacco Use   Smoking status: Never   Smokeless tobacco: Never  Vaping Use   Vaping status: Never Used  Substance and  Sexual Activity   Alcohol use: No    Alcohol/week: 0.0 standard drinks of alcohol   Drug use: No   Sexual activity: Not on file  Other Topics Concern   Not on file  Social History Narrative   Lives with husband in a one story home.  Has one child.     Retired from  [employer] and [employer].     Right handed   Social Drivers of Health   Financial Resource Strain: Not on file  Food Insecurity: No Food Insecurity (11/07/2022)   Hunger Vital Sign    Worried About Running Out of Food in the Last Year: Never true    Ran Out of Food in the Last Year: Never true  Transportation Needs: No Transportation Needs (11/07/2022)   PRAPARE - Administrator, Civil Service (Medical): No    Lack of Transportation (Non-Medical): No  Physical Activity: Not on file  Stress: Not on file  Social Connections: Not on file  Intimate Partner Violence: Not At Risk (03/01/2022)   Humiliation, Afraid, Rape, and Kick questionnaire    Fear of Current or Ex-Partner: No    Emotionally Abused: No    Physically Abused: No    Sexually Abused: No    Family History  Problem Relation Age of Onset   Hypertension Mother  Kidney failure Mother    Heart disease Mother    Hypertension Father    Cancer Father    Hypertension Sister    Hypertension Sister    Healthy Son    Breast cancer Neg Hx     Allergies  Allergen Reactions   Simvastatin Rash    Outpatient Medications Prior to Visit  Medication Sig   allopurinol  (ZYLOPRIM ) 100 MG tablet TAKE 1 TABLET BY MOUTH EVERY DAY   baclofen  (LIORESAL ) 10 MG tablet Take 1 tablet (10 mg total) by mouth at bedtime.   brimonidine -timolol  (COMBIGAN ) 0.2-0.5 % ophthalmic solution Administer 10 drops to both eyes Two (2) times a day. Frequency:PHARMDIR   Dosage:29ml     Instructions:  Note:Instill one drop in each eye twice daily. Dose: 1   brinzolamide  (AZOPT ) 1 % ophthalmic suspension Administer 1 drop to both eyes Two (2) times a day (at 8am and 5pm). Frequency:PHARMDIR    Dosage:0.0     Instructions:  Note:Instill one drop in each eye twice a day. Dose: 1   Cholecalciferol  (VITAMIN D3) 1.25 MG (50000 UT) CAPS TAKE ONE CAPSULE BY MOUTH ONCE A WEEK   diclofenac  Sodium (VOLTAREN ) 1 % GEL Apply 2 g topically 2 (two) times daily.   gabapentin  (NEURONTIN ) 100 MG capsule Take 1 capsule (100 mg total) by mouth 2 (two) times daily.   latanoprost  (XALATAN ) 0.005 % ophthalmic solution Administer 2.5 mL to both eyes nightly. Frequency:QD   Dosage:0.0     Instructions:  Note:Dose: 0.005 %   levETIRAcetam  (KEPPRA ) 500 MG tablet Take 1 tablet (500 mg total) by mouth 2 (two) times daily.   losartan  (COZAAR ) 100 MG tablet Take 1 tablet (100 mg total) by mouth daily.   oxybutynin  (DITROPAN -XL) 5 MG 24 hr tablet TAKE 1 TABLET BY MOUTH EVERY DAY   prednisoLONE  acetate (PRED FORTE ) 1 % ophthalmic suspension Place 1 drop into the left eye 4 (four) times daily.   torsemide (DEMADEX) 10 MG tablet Take 10 mg by mouth daily. Take Monday, Wednesday, and Friday   No facility-administered medications prior to visit.    Review of Systems  Constitutional: Negative.  Negative for chills, fever and weight loss.  HENT: Negative.  Negative for sore throat.   Eyes: Negative.   Respiratory: Negative.  Negative for cough and shortness of breath.   Cardiovascular: Negative.  Negative for chest pain, palpitations and leg swelling.  Gastrointestinal: Negative.  Negative for abdominal pain, constipation, diarrhea, heartburn, nausea and vomiting.  Genitourinary: Negative.  Negative for dysuria and flank pain.  Musculoskeletal:  Positive for back pain and joint pain. Negative for myalgias and neck pain.  Skin: Negative.   Neurological: Negative.  Negative for dizziness, tingling, tremors, sensory change and headaches.  Endo/Heme/Allergies: Negative.   Psychiatric/Behavioral: Negative.  Negative for depression and suicidal ideas. The patient is not nervous/anxious.        Objective:   BP 132/76    Pulse 62   Ht 5\' 4"  (1.626 m)   Wt 146 lb 3.2 oz (66.3 kg)   LMP  (LMP Unknown)   SpO2 97%   BMI 25.10 kg/m   Vitals:   06/24/23 1021  BP: 132/76  Pulse: 62  Height: 5\' 4"  (1.626 m)  Weight: 146 lb 3.2 oz (66.3 kg)  SpO2: 97%  BMI (Calculated): 25.08    Physical Exam Vitals and nursing note reviewed.  Constitutional:      Appearance: Normal appearance.  HENT:     Head: Normocephalic and atraumatic.  Nose: Nose normal.     Mouth/Throat:     Mouth: Mucous membranes are moist.     Pharynx: Oropharynx is clear.  Eyes:     Conjunctiva/sclera: Conjunctivae normal.     Pupils: Pupils are equal, round, and reactive to light.  Cardiovascular:     Rate and Rhythm: Normal rate and regular rhythm.     Pulses: Normal pulses.     Heart sounds: Normal heart sounds. No murmur heard. Pulmonary:     Effort: Pulmonary effort is normal.     Breath sounds: Normal breath sounds. No wheezing.  Abdominal:     General: Bowel sounds are normal.     Palpations: Abdomen is soft.     Tenderness: There is no abdominal tenderness. There is no right CVA tenderness or left CVA tenderness.  Musculoskeletal:        General: Normal range of motion.     Cervical back: Normal range of motion.     Right lower leg: No edema.     Left lower leg: No edema.  Skin:    General: Skin is warm and dry.  Neurological:     General: No focal deficit present.     Mental Status: She is alert and oriented to person, place, and time.  Psychiatric:        Mood and Affect: Mood normal.        Behavior: Behavior normal.      No results found for any visits on 06/24/23.  Recent Results (from the past 2160 hours)  Lipid Profile     Status: Abnormal   Collection Time: 06/20/23  8:55 AM  Result Value Ref Range   Cholesterol, Total 208 (H) 100 - 199 mg/dL   Triglycerides 78 0 - 149 mg/dL   HDL 70 >95 mg/dL   VLDL Cholesterol Cal 14 5 - 40 mg/dL   LDL Chol Calc (NIH) 621 (H) 0 - 99 mg/dL   Chol/HDL Ratio  3.0 0.0 - 4.4 ratio    Comment:                                   T. Chol/HDL Ratio                                             Men  Women                               1/2 Avg.Risk  3.4    3.3                                   Avg.Risk  5.0    4.4                                2X Avg.Risk  9.6    7.1                                3X Avg.Risk 23.4   11.0   CBC With Diff/Platelet     Status: Abnormal   Collection  Time: 06/20/23  8:55 AM  Result Value Ref Range   WBC 6.4 3.4 - 10.8 x10E3/uL   RBC 4.29 3.77 - 5.28 x10E6/uL   Hemoglobin 11.4 11.1 - 15.9 g/dL   Hematocrit 11.9 14.7 - 46.6 %   MCV 87 79 - 97 fL   MCH 26.6 26.6 - 33.0 pg   MCHC 30.6 (L) 31.5 - 35.7 g/dL   RDW 82.9 56.2 - 13.0 %   Platelets 309 150 - 450 x10E3/uL   Neutrophils 48 Not Estab. %   Lymphs 38 Not Estab. %   Monocytes 9 Not Estab. %   Eos 4 Not Estab. %   Basos 1 Not Estab. %   Neutrophils Absolute 3.1 1.4 - 7.0 x10E3/uL   Lymphocytes Absolute 2.4 0.7 - 3.1 x10E3/uL   Monocytes Absolute 0.6 0.1 - 0.9 x10E3/uL   EOS (ABSOLUTE) 0.2 0.0 - 0.4 x10E3/uL   Basophils Absolute 0.0 0.0 - 0.2 x10E3/uL   Immature Granulocytes 0 Not Estab. %   Immature Grans (Abs) 0.0 0.0 - 0.1 x10E3/uL      Assessment & Plan:  Add prednisone  burst.  Continue other medications.  Monitor blood pressure. Problem List Items Addressed This Visit     Chronic kidney disease   Hyperlipemia   Essential hypertension, benign   Lumbar disc disorder with myelopathy   Gout   Prediabetes   Other Visit Diagnoses       Trochanteric bursitis of right hip    -  Primary   Relevant Medications   predniSONE  (DELTASONE ) 20 MG tablet       Return in about 2 weeks (around 07/08/2023).   Total time spent: 25 minutes  Aisha Hove, MD  06/24/2023   This document may have been prepared by [facility] Voice Recognition software and as such may include unintentional dictation errors.

## 2023-07-07 ENCOUNTER — Other Ambulatory Visit: Payer: Self-pay | Admitting: Internal Medicine

## 2023-07-07 DIAGNOSIS — M1000 Idiopathic gout, unspecified site: Secondary | ICD-10-CM

## 2023-07-08 ENCOUNTER — Ambulatory Visit (INDEPENDENT_AMBULATORY_CARE_PROVIDER_SITE_OTHER): Admitting: Internal Medicine

## 2023-07-08 ENCOUNTER — Encounter: Payer: Self-pay | Admitting: Internal Medicine

## 2023-07-08 VITALS — BP 118/74 | HR 60 | Ht 64.0 in | Wt 145.2 lb

## 2023-07-08 DIAGNOSIS — M1000 Idiopathic gout, unspecified site: Secondary | ICD-10-CM

## 2023-07-08 DIAGNOSIS — M7061 Trochanteric bursitis, right hip: Secondary | ICD-10-CM | POA: Diagnosis not present

## 2023-07-08 DIAGNOSIS — E782 Mixed hyperlipidemia: Secondary | ICD-10-CM

## 2023-07-08 DIAGNOSIS — I10 Essential (primary) hypertension: Secondary | ICD-10-CM | POA: Diagnosis not present

## 2023-07-08 DIAGNOSIS — N1831 Chronic kidney disease, stage 3a: Secondary | ICD-10-CM

## 2023-07-08 MED ORDER — ALLOPURINOL 100 MG PO TABS
100.0000 mg | ORAL_TABLET | Freq: Every day | ORAL | 3 refills | Status: DC
Start: 2023-07-08 — End: 2023-10-08

## 2023-07-08 NOTE — Progress Notes (Signed)
 Established Patient Office Visit  Subjective:  Patient ID: Anna Chambers, female    DOB: 1940-05-12  Age: 83 y.o. MRN: [id number]  Chief Complaint  Patient presents with   Follow-up    2 week follow up    Patient comes in for her follow-up today.  She has completed her prednisone  and has marked improvement in her right hip pain due to bursitis.  She is feeling much better today and has no new complaints.  Her blood pressure is also looking better.    No other concerns at this time.   Past Medical History:  Diagnosis Date   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HCC)    Chronic kidney disease 01/22/2019   Depression    Hypertension    Seizure (HCC) 02/25/2017   Sleep apnea    Syncope and collapse 02/25/2017    Past Surgical History:  Procedure Laterality Date   CATARACT EXTRACTION     CATARACT EXTRACTION W/PHACO Left 04/12/2021   Procedure: CATARACT EXTRACTION PHACO AND INTRAOCULAR LENS PLACEMENT (IOC) LEFT 8.12 01:13.1;  Surgeon: Annell Kidney, MD;  Location: [facility];  Service: Ophthalmology;  Laterality: Left;   COLONOSCOPY     COLONOSCOPY WITH PROPOFOL  N/A 12/04/2018   Procedure: COLONOSCOPY WITH PROPOFOL ;  Surgeon: Toledo, Alphonsus Jeans, MD;  Location: [facility];  Service: Gastroenterology;  Laterality: N/A;   EYE SURGERY     FOOT SURGERY Left    NECK SURGERY     SPINE SURGERY     ACDF C3-4   VAGINAL HYSTERECTOMY      Social History   Socioeconomic History   Marital status: Married    Spouse name: Not on file   Number of children: 1   Years of education: Not on file   Highest education level: Not on file  Occupational History   Occupation: retired  Tobacco Use   Smoking status: Never   Smokeless tobacco: Never  Vaping Use   Vaping status: Never Used  Substance and Sexual Activity   Alcohol use: No    Alcohol/week: 0.0 standard drinks of alcohol   Drug use: No   Sexual activity: Not on file  Other Topics Concern   Not on  file  Social History Narrative   Lives with husband in a one story home.  Has one child.     Retired from  [employer] and [employer].     Right handed   Social Drivers of Health   Financial Resource Strain: Not on file  Food Insecurity: No Food Insecurity (11/07/2022)   Hunger Vital Sign    Worried About Running Out of Food in the Last Year: Never true    Ran Out of Food in the Last Year: Never true  Transportation Needs: No Transportation Needs (11/07/2022)   PRAPARE - Administrator, Civil Service (Medical): No    Lack of Transportation (Non-Medical): No  Physical Activity: Not on file  Stress: Not on file  Social Connections: Not on file  Intimate Partner Violence: Not At Risk (03/01/2022)   Humiliation, Afraid, Rape, and Kick questionnaire    Fear of Current or Ex-Partner: No    Emotionally Abused: No    Physically Abused: No    Sexually Abused: No    Family History  Problem Relation Age of Onset   Hypertension Mother    Kidney failure Mother    Heart disease Mother    Hypertension Father    Cancer Father    Hypertension Sister  Hypertension Sister    Healthy Son    Breast cancer Neg Hx     Allergies  Allergen Reactions   Simvastatin Rash    Outpatient Medications Prior to Visit  Medication Sig   baclofen  (LIORESAL ) 10 MG tablet Take 1 tablet (10 mg total) by mouth at bedtime.   brimonidine -timolol  (COMBIGAN ) 0.2-0.5 % ophthalmic solution Administer 10 drops to both eyes Two (2) times a day. Frequency:PHARMDIR   Dosage:7ml     Instructions:  Note:Instill one drop in each eye twice daily. Dose: 1   brinzolamide  (AZOPT ) 1 % ophthalmic suspension Administer 1 drop to both eyes Two (2) times a day (at 8am and 5pm). Frequency:PHARMDIR   Dosage:0.0     Instructions:  Note:Instill one drop in each eye twice a day. Dose: 1   Cholecalciferol  (VITAMIN D3) 1.25 MG (50000 UT) CAPS TAKE ONE CAPSULE BY MOUTH ONCE A WEEK   diclofenac  Sodium (VOLTAREN ) 1 % GEL Apply 2 g  topically 2 (two) times daily.   gabapentin  (NEURONTIN ) 100 MG capsule Take 1 capsule (100 mg total) by mouth 2 (two) times daily.   latanoprost  (XALATAN ) 0.005 % ophthalmic solution Administer 2.5 mL to both eyes nightly. Frequency:QD   Dosage:0.0     Instructions:  Note:Dose: 0.005 %   levETIRAcetam  (KEPPRA ) 500 MG tablet Take 1 tablet (500 mg total) by mouth 2 (two) times daily.   losartan  (COZAAR ) 100 MG tablet Take 1 tablet (100 mg total) by mouth daily.   oxybutynin  (DITROPAN -XL) 5 MG 24 hr tablet TAKE 1 TABLET BY MOUTH EVERY DAY   prednisoLONE  acetate (PRED FORTE ) 1 % ophthalmic suspension Place 1 drop into the left eye 4 (four) times daily.   torsemide (DEMADEX) 10 MG tablet Take 10 mg by mouth daily. Take Monday, Wednesday, and Friday   [DISCONTINUED] allopurinol  (ZYLOPRIM ) 100 MG tablet TAKE 1 TABLET BY MOUTH EVERY DAY   predniSONE  (DELTASONE ) 20 MG tablet Take 2 tablets (40 mg total) by mouth daily with breakfast. (Patient not taking: Reported on 07/08/2023)   No facility-administered medications prior to visit.    Review of Systems  Constitutional: Negative.  Negative for chills, fever, malaise/fatigue and weight loss.  HENT: Negative.  Negative for congestion and sore throat.   Eyes: Negative.   Respiratory: Negative.  Negative for cough and shortness of breath.   Cardiovascular: Negative.  Negative for chest pain, palpitations and leg swelling.  Gastrointestinal: Negative.  Negative for abdominal pain, constipation, diarrhea, heartburn, nausea and vomiting.  Genitourinary: Negative.  Negative for dysuria and flank pain.  Musculoskeletal: Negative.  Negative for joint pain and myalgias.  Skin: Negative.   Neurological: Negative.  Negative for dizziness, tingling, tremors, sensory change and headaches.  Endo/Heme/Allergies: Negative.   Psychiatric/Behavioral: Negative.  Negative for depression and suicidal ideas. The patient is not nervous/anxious.        Objective:   BP  118/74   Pulse 60   Ht 5\' 4"  (1.626 m)   Wt 145 lb 3.2 oz (65.9 kg)   LMP  (LMP Unknown)   SpO2 99%   BMI 24.92 kg/m   Vitals:   07/08/23 1116  BP: 118/74  Pulse: 60  Height: 5\' 4"  (1.626 m)  Weight: 145 lb 3.2 oz (65.9 kg)  SpO2: 99%  BMI (Calculated): 24.91    Physical Exam Vitals and nursing note reviewed.  Constitutional:      Appearance: Normal appearance.  HENT:     Head: Normocephalic and atraumatic.     Nose:  Nose normal.     Mouth/Throat:     Mouth: Mucous membranes are moist.     Pharynx: Oropharynx is clear.  Eyes:     Conjunctiva/sclera: Conjunctivae normal.     Pupils: Pupils are equal, round, and reactive to light.  Cardiovascular:     Rate and Rhythm: Normal rate and regular rhythm.     Pulses: Normal pulses.     Heart sounds: Normal heart sounds. No murmur heard. Pulmonary:     Effort: Pulmonary effort is normal.     Breath sounds: Normal breath sounds. No wheezing.  Abdominal:     General: Bowel sounds are normal.     Palpations: Abdomen is soft.     Tenderness: There is no abdominal tenderness. There is no right CVA tenderness or left CVA tenderness.  Musculoskeletal:        General: Normal range of motion.     Cervical back: Normal range of motion.     Right lower leg: No edema.     Left lower leg: No edema.  Skin:    General: Skin is warm and dry.  Neurological:     General: No focal deficit present.     Mental Status: She is alert and oriented to person, place, and time.  Psychiatric:        Mood and Affect: Mood normal.        Behavior: Behavior normal.      No results found for any visits on 07/08/23.  Recent Results (from the past 2160 hours)  Lipid Profile     Status: Abnormal   Collection Time: 06/20/23  8:55 AM  Result Value Ref Range   Cholesterol, Total 208 (H) 100 - 199 mg/dL   Triglycerides 78 0 - 149 mg/dL   HDL 70 >95 mg/dL   VLDL Cholesterol Cal 14 5 - 40 mg/dL   LDL Chol Calc (NIH) 621 (H) 0 - 99 mg/dL    Chol/HDL Ratio 3.0 0.0 - 4.4 ratio    Comment:                                   T. Chol/HDL Ratio                                             Men  Women                               1/2 Avg.Risk  3.4    3.3                                   Avg.Risk  5.0    4.4                                2X Avg.Risk  9.6    7.1                                3X Avg.Risk 23.4   11.0   CBC With Diff/Platelet     Status: Abnormal   Collection Time:  06/20/23  8:55 AM  Result Value Ref Range   WBC 6.4 3.4 - 10.8 x10E3/uL   RBC 4.29 3.77 - 5.28 x10E6/uL   Hemoglobin 11.4 11.1 - 15.9 g/dL   Hematocrit 52.8 41.3 - 46.6 %   MCV 87 79 - 97 fL   MCH 26.6 26.6 - 33.0 pg   MCHC 30.6 (L) 31.5 - 35.7 g/dL   RDW 24.4 01.0 - 27.2 %   Platelets 309 150 - 450 x10E3/uL   Neutrophils 48 Not Estab. %   Lymphs 38 Not Estab. %   Monocytes 9 Not Estab. %   Eos 4 Not Estab. %   Basos 1 Not Estab. %   Neutrophils Absolute 3.1 1.4 - 7.0 x10E3/uL   Lymphocytes Absolute 2.4 0.7 - 3.1 x10E3/uL   Monocytes Absolute 0.6 0.1 - 0.9 x10E3/uL   EOS (ABSOLUTE) 0.2 0.0 - 0.4 x10E3/uL   Basophils Absolute 0.0 0.0 - 0.2 x10E3/uL   Immature Granulocytes 0 Not Estab. %   Immature Grans (Abs) 0.0 0.0 - 0.1 x10E3/uL      Assessment & Plan:  Patient advised to continue taking current medications.  Will consider physical therapy if she has recurrence of right hip pain. Problem List Items Addressed This Visit     Chronic kidney disease   Hyperlipemia   Essential hypertension, benign - Primary   Gout   Relevant Medications   allopurinol  (ZYLOPRIM ) 100 MG tablet   Other Visit Diagnoses       Trochanteric bursitis of right hip           Return in about 3 months (around 10/08/2023).   Total time spent: 30 minutes  Aisha Hove, MD  07/08/2023   This document may have been prepared by [facility] Voice Recognition software and as such may include unintentional dictation errors.

## 2023-08-20 ENCOUNTER — Encounter: Payer: Self-pay | Admitting: Cardiology

## 2023-08-20 ENCOUNTER — Ambulatory Visit (INDEPENDENT_AMBULATORY_CARE_PROVIDER_SITE_OTHER): Admitting: Cardiology

## 2023-08-20 ENCOUNTER — Other Ambulatory Visit: Payer: Self-pay | Admitting: Cardiology

## 2023-08-20 VITALS — BP 124/81 | HR 69 | Ht 64.0 in | Wt 139.0 lb

## 2023-08-20 DIAGNOSIS — M545 Low back pain, unspecified: Secondary | ICD-10-CM

## 2023-08-20 DIAGNOSIS — K219 Gastro-esophageal reflux disease without esophagitis: Secondary | ICD-10-CM | POA: Diagnosis not present

## 2023-08-20 DIAGNOSIS — M7061 Trochanteric bursitis, right hip: Secondary | ICD-10-CM | POA: Diagnosis not present

## 2023-08-20 DIAGNOSIS — Z0130 Encounter for examination of blood pressure without abnormal findings: Secondary | ICD-10-CM | POA: Diagnosis not present

## 2023-08-20 MED ORDER — OMEPRAZOLE 20 MG PO CPDR: 20.0000 mg | DELAYED_RELEASE_CAPSULE | Freq: Every day | ORAL | 1 refills | Status: DC

## 2023-08-20 MED ORDER — MELOXICAM 10 MG PO CAPS: 10.0000 mg | ORAL_CAPSULE | Freq: Every day | ORAL | 0 refills | Status: DC | PRN

## 2023-08-20 MED ORDER — PREDNISONE 20 MG PO TABS: 40.0000 mg | ORAL_TABLET | Freq: Every day | ORAL | 0 refills | Status: AC

## 2023-08-20 NOTE — Progress Notes (Signed)
 Established Patient Office Visit  Subjective:  Patient ID: Anna Chambers, female    DOB: 1940-04-14  Age: 83 y.o. MRN: [id number]  Chief Complaint  Patient presents with   Acute Visit    Back pain started last week but getting worse. Pain is radiating down pt leg.     Patient in office for an acute visit, complaining of hip and lower back pain, started one week ago, getting worse. Patient was seen and treated for bursitis of the hip the beginning of May 2025. Xray showed minimal arthritis but more likely bursitis.  Was treated with prednisone  burst at that time. Patient comes in today complaining of the same pain. Will send in prednisone  and Meloxicam .  Patient also complaining of acid reflux. Has a remote history of a bleeding ulcer. Will send in omeprazole .     No other concerns at this time.   Past Medical History:  Diagnosis Date   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HCC)    Chronic kidney disease 01/22/2019   Depression    Hypertension    Seizure (HCC) 02/25/2017   Sleep apnea    Syncope and collapse 02/25/2017    Past Surgical History:  Procedure Laterality Date   CATARACT EXTRACTION     CATARACT EXTRACTION W/PHACO Left 04/12/2021   Procedure: CATARACT EXTRACTION PHACO AND INTRAOCULAR LENS PLACEMENT (IOC) LEFT 8.12 01:13.1;  Surgeon: Mittie Gaskin, MD;  Location: [facility];  Service: Ophthalmology;  Laterality: Left;   COLONOSCOPY     COLONOSCOPY WITH PROPOFOL  N/A 12/04/2018   Procedure: COLONOSCOPY WITH PROPOFOL ;  Surgeon: Toledo, Ladell POUR, MD;  Location: [facility];  Service: Gastroenterology;  Laterality: N/A;   EYE SURGERY     FOOT SURGERY Left    NECK SURGERY     SPINE SURGERY     ACDF C3-4   VAGINAL HYSTERECTOMY      Social History   Socioeconomic History   Marital status: Married    Spouse name: Not on file   Number of children: 1   Years of education: Not on file   Highest education level: Not on file  Occupational  History   Occupation: retired  Tobacco Use   Smoking status: Never   Smokeless tobacco: Never  Vaping Use   Vaping status: Never Used  Substance and Sexual Activity   Alcohol use: No    Alcohol/week: 0.0 standard drinks of alcohol   Drug use: No   Sexual activity: Not on file  Other Topics Concern   Not on file  Social History Narrative   Lives with husband in a one story home.  Has one child.     Retired from  [employer] and [employer].     Right handed   Social Drivers of Health   Financial Resource Strain: Not on file  Food Insecurity: No Food Insecurity (11/07/2022)   Hunger Vital Sign    Worried About Running Out of Food in the Last Year: Never true    Ran Out of Food in the Last Year: Never true  Transportation Needs: No Transportation Needs (11/07/2022)   PRAPARE - Administrator, Civil Service (Medical): No    Lack of Transportation (Non-Medical): No  Physical Activity: Not on file  Stress: Not on file  Social Connections: Not on file  Intimate Partner Violence: Not At Risk (03/01/2022)   Humiliation, Afraid, Rape, and Kick questionnaire    Fear of Current or Ex-Partner: No    Emotionally Abused: No  Physically Abused: No    Sexually Abused: No    Family History  Problem Relation Age of Onset   Hypertension Mother    Kidney failure Mother    Heart disease Mother    Hypertension Father    Cancer Father    Hypertension Sister    Hypertension Sister    Healthy Son    Breast cancer Neg Hx     Allergies  Allergen Reactions   Simvastatin Rash    Outpatient Medications Prior to Visit  Medication Sig   allopurinol  (ZYLOPRIM ) 100 MG tablet Take 1 tablet (100 mg total) by mouth daily.   baclofen  (LIORESAL ) 10 MG tablet Take 1 tablet (10 mg total) by mouth at bedtime.   brimonidine -timolol  (COMBIGAN ) 0.2-0.5 % ophthalmic solution Administer 10 drops to both eyes Two (2) times a day. Frequency:PHARMDIR   Dosage:55ml     Instructions:  Note:Instill one drop in  each eye twice daily. Dose: 1   brinzolamide  (AZOPT ) 1 % ophthalmic suspension Administer 1 drop to both eyes Two (2) times a day (at 8am and 5pm). Frequency:PHARMDIR   Dosage:0.0     Instructions:  Note:Instill one drop in each eye twice a day. Dose: 1   Cholecalciferol  (VITAMIN D3) 1.25 MG (50000 UT) CAPS TAKE ONE CAPSULE BY MOUTH ONCE A WEEK   diclofenac  Sodium (VOLTAREN ) 1 % GEL Apply 2 g topically 2 (two) times daily.   gabapentin  (NEURONTIN ) 100 MG capsule Take 1 capsule (100 mg total) by mouth 2 (two) times daily.   latanoprost  (XALATAN ) 0.005 % ophthalmic solution Administer 2.5 mL to both eyes nightly. Frequency:QD   Dosage:0.0     Instructions:  Note:Dose: 0.005 %   levETIRAcetam  (KEPPRA ) 500 MG tablet Take 1 tablet (500 mg total) by mouth 2 (two) times daily.   losartan  (COZAAR ) 100 MG tablet Take 1 tablet (100 mg total) by mouth daily.   oxybutynin  (DITROPAN -XL) 5 MG 24 hr tablet TAKE 1 TABLET BY MOUTH EVERY DAY   prednisoLONE  acetate (PRED FORTE ) 1 % ophthalmic suspension Place 1 drop into the left eye 4 (four) times daily.   torsemide (DEMADEX) 10 MG tablet Take 10 mg by mouth daily. Take Monday, Wednesday, and Friday   [DISCONTINUED] predniSONE  (DELTASONE ) 20 MG tablet Take 2 tablets (40 mg total) by mouth daily with breakfast. (Patient not taking: Reported on 08/20/2023)   No facility-administered medications prior to visit.    Review of Systems  Constitutional: Negative.   HENT: Negative.    Eyes: Negative.   Respiratory: Negative.  Negative for shortness of breath.   Cardiovascular: Negative.   Gastrointestinal:  Positive for heartburn. Negative for constipation and diarrhea.  Skin: Negative.   Endo/Heme/Allergies: Negative.   All other systems reviewed and are negative.      Objective:   BP 124/81   Pulse 69   Ht 5' 4 (1.626 m)   Wt 139 lb (63 kg)   LMP  (LMP Unknown)   SpO2 98%   BMI 23.86 kg/m   Vitals:   08/20/23 1302  BP: 124/81  Pulse: 69  Height:  5' 4 (1.626 m)  Weight: 139 lb (63 kg)  SpO2: 98%  BMI (Calculated): 23.85    Physical Exam Vitals and nursing note reviewed.  Constitutional:      Appearance: Normal appearance. She is normal weight.  HENT:     Head: Normocephalic and atraumatic.     Nose: Nose normal.     Mouth/Throat:     Mouth: Mucous  membranes are moist.  Eyes:     Extraocular Movements: Extraocular movements intact.     Conjunctiva/sclera: Conjunctivae normal.     Pupils: Pupils are equal, round, and reactive to light.  Cardiovascular:     Rate and Rhythm: Normal rate and regular rhythm.     Pulses: Normal pulses.     Heart sounds: Normal heart sounds.  Pulmonary:     Effort: Pulmonary effort is normal.     Breath sounds: Normal breath sounds.  Abdominal:     General: Abdomen is flat. Bowel sounds are normal.     Palpations: Abdomen is soft.  Musculoskeletal:        General: Normal range of motion.     Cervical back: Normal range of motion.  Skin:    General: Skin is warm and dry.  Neurological:     General: No focal deficit present.     Mental Status: She is alert and oriented to person, place, and time.  Psychiatric:        Mood and Affect: Mood normal.        Behavior: Behavior normal.        Thought Content: Thought content normal.        Judgment: Judgment normal.      No results found for any visits on 08/20/23.  Recent Results (from the past 2160 hours)  Lipid Profile     Status: Abnormal   Collection Time: 06/20/23  8:55 AM  Result Value Ref Range   Cholesterol, Total 208 (H) 100 - 199 mg/dL   Triglycerides 78 0 - 149 mg/dL   HDL 70 >60 mg/dL   VLDL Cholesterol Cal 14 5 - 40 mg/dL   LDL Chol Calc (NIH) 875 (H) 0 - 99 mg/dL   Chol/HDL Ratio 3.0 0.0 - 4.4 ratio    Comment:                                   T. Chol/HDL Ratio                                             Men  Women                               1/2 Avg.Risk  3.4    3.3                                   Avg.Risk   5.0    4.4                                2X Avg.Risk  9.6    7.1                                3X Avg.Risk 23.4   11.0   CBC With Diff/Platelet     Status: Abnormal   Collection Time: 06/20/23  8:55 AM  Result Value Ref Range   WBC 6.4 3.4 - 10.8 x10E3/uL   RBC 4.29 3.77 - 5.28 x10E6/uL   Hemoglobin 11.4  11.1 - 15.9 g/dL   Hematocrit 62.7 65.9 - 46.6 %   MCV 87 79 - 97 fL   MCH 26.6 26.6 - 33.0 pg   MCHC 30.6 (L) 31.5 - 35.7 g/dL   RDW 85.5 88.2 - 84.5 %   Platelets 309 150 - 450 x10E3/uL   Neutrophils 48 Not Estab. %   Lymphs 38 Not Estab. %   Monocytes 9 Not Estab. %   Eos 4 Not Estab. %   Basos 1 Not Estab. %   Neutrophils Absolute 3.1 1.4 - 7.0 x10E3/uL   Lymphocytes Absolute 2.4 0.7 - 3.1 x10E3/uL   Monocytes Absolute 0.6 0.1 - 0.9 x10E3/uL   EOS (ABSOLUTE) 0.2 0.0 - 0.4 x10E3/uL   Basophils Absolute 0.0 0.0 - 0.2 x10E3/uL   Immature Granulocytes 0 Not Estab. %   Immature Grans (Abs) 0.0 0.0 - 0.1 x10E3/uL      Assessment & Plan:  Prednisone  Meloxicam  Omeprazole   Problem List Items Addressed This Visit       Digestive   Gastroesophageal reflux disease without esophagitis   Relevant Medications   omeprazole  (PRILOSEC) 20 MG capsule     Musculoskeletal and Integument   Trochanteric bursitis of right hip - Primary   Relevant Medications   predniSONE  (DELTASONE ) 20 MG tablet    Return if symptoms worsen or fail to improve, for as previously scheduled.   Total time spent: 25 minutes  [employer], NP  08/20/2023   This document may have been prepared by Dragon Voice Recognition software and as such may include unintentional dictation errors.

## 2023-09-02 ENCOUNTER — Ambulatory Visit (INDEPENDENT_AMBULATORY_CARE_PROVIDER_SITE_OTHER): Admitting: Cardiovascular Disease

## 2023-09-02 ENCOUNTER — Encounter: Payer: Self-pay | Admitting: Cardiovascular Disease

## 2023-09-02 VITALS — BP 120/82 | HR 71 | Ht 64.0 in | Wt 141.4 lb

## 2023-09-02 DIAGNOSIS — I10 Essential (primary) hypertension: Secondary | ICD-10-CM

## 2023-09-02 DIAGNOSIS — M1000 Idiopathic gout, unspecified site: Secondary | ICD-10-CM

## 2023-09-02 DIAGNOSIS — N1831 Chronic kidney disease, stage 3a: Secondary | ICD-10-CM

## 2023-09-02 DIAGNOSIS — R0789 Other chest pain: Secondary | ICD-10-CM | POA: Diagnosis not present

## 2023-09-02 DIAGNOSIS — E782 Mixed hyperlipidemia: Secondary | ICD-10-CM | POA: Diagnosis not present

## 2023-09-02 DIAGNOSIS — K219 Gastro-esophageal reflux disease without esophagitis: Secondary | ICD-10-CM

## 2023-09-02 MED ORDER — PANTOPRAZOLE SODIUM 40 MG PO TBEC: 40.0000 mg | DELAYED_RELEASE_TABLET | Freq: Every day | ORAL | 1 refills | Status: DC

## 2023-09-02 NOTE — Progress Notes (Signed)
 Cardiology Office Note   Date:  09/02/2023   ID:  Waniya, Hoglund [birth year], MRN [id number]  PCP:  Fernand Fredy RAMAN, MD  Cardiologist:  Denyse Fernand, MD      History of Present Illness: Anna Chambers is a 83 y.o. female who presents for  Chief Complaint  Patient presents with   Follow-up    Hospital follow up. Having chest pain and headache hospital stated it was acid reflux.     82 YOBF went to ER with chest pain and SOB. Ruled out for MI. Sent hewre for evaluation. Also has beck pains.      Past Medical History:  Diagnosis Date   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HCC)    Chronic kidney disease 01/22/2019   Depression    Hypertension    Seizure (HCC) 02/25/2017   Sleep apnea    Syncope and collapse 02/25/2017     Past Surgical History:  Procedure Laterality Date   CATARACT EXTRACTION     CATARACT EXTRACTION W/PHACO Left 04/12/2021   Procedure: CATARACT EXTRACTION PHACO AND INTRAOCULAR LENS PLACEMENT (IOC) LEFT 8.12 01:13.1;  Surgeon: Mittie Gaskin, MD;  Location: [facility];  Service: Ophthalmology;  Laterality: Left;   COLONOSCOPY     COLONOSCOPY WITH PROPOFOL  N/A 12/04/2018   Procedure: COLONOSCOPY WITH PROPOFOL ;  Surgeon: Toledo, Ladell POUR, MD;  Location: [facility];  Service: Gastroenterology;  Laterality: N/A;   EYE SURGERY     FOOT SURGERY Left    NECK SURGERY     SPINE SURGERY     ACDF C3-4   VAGINAL HYSTERECTOMY       Current Outpatient Medications  Medication Sig Dispense Refill   allopurinol  (ZYLOPRIM ) 100 MG tablet Take 1 tablet (100 mg total) by mouth daily. 90 tablet 3   baclofen  (LIORESAL ) 10 MG tablet Take 1 tablet (10 mg total) by mouth at bedtime. 60 tablet 1   brimonidine -timolol  (COMBIGAN ) 0.2-0.5 % ophthalmic solution Administer 10 drops to both eyes Two (2) times a day. Frequency:PHARMDIR   Dosage:57ml     Instructions:  Note:Instill one drop in each eye twice daily. Dose: 1     brinzolamide  (AZOPT ) 1  % ophthalmic suspension Administer 1 drop to both eyes Two (2) times a day (at 8am and 5pm). Frequency:PHARMDIR   Dosage:0.0     Instructions:  Note:Instill one drop in each eye twice a day. Dose: 1     Cholecalciferol  (VITAMIN D3) 1.25 MG (50000 UT) CAPS TAKE ONE CAPSULE BY MOUTH ONCE A WEEK 12 capsule 3   diclofenac  Sodium (VOLTAREN ) 1 % GEL Apply 2 g topically 2 (two) times daily. 350 g 3   gabapentin  (NEURONTIN ) 100 MG capsule Take 1 capsule (100 mg total) by mouth 2 (two) times daily. 60 capsule 2   latanoprost  (XALATAN ) 0.005 % ophthalmic solution Administer 2.5 mL to both eyes nightly. Frequency:QD   Dosage:0.0     Instructions:  Note:Dose: 0.005 %     levETIRAcetam  (KEPPRA ) 500 MG tablet Take 1 tablet (500 mg total) by mouth 2 (two) times daily. 180 tablet 3   losartan  (COZAAR ) 100 MG tablet Take 1 tablet (100 mg total) by mouth daily. 90 tablet 3   meloxicam  (MOBIC ) 15 MG tablet Take 1 tablet (15 mg total) by mouth daily. 30 tablet 0   oxybutynin  (DITROPAN -XL) 5 MG 24 hr tablet TAKE 1 TABLET BY MOUTH EVERY DAY 90 tablet 2   pantoprazole  (PROTONIX ) 40 MG tablet Take 1 tablet (40  mg total) by mouth daily. 30 tablet 1   prednisoLONE  acetate (PRED FORTE ) 1 % ophthalmic suspension Place 1 drop into the left eye 4 (four) times daily.     torsemide (DEMADEX) 10 MG tablet Take 10 mg by mouth daily. Take Monday, Wednesday, and Friday     No current facility-administered medications for this visit.    Allergies:   Simvastatin    Social History:   reports that she has never smoked. She has never used smokeless tobacco. She reports that she does not drink alcohol and does not use drugs.   Family History:  family history includes Cancer in her father; Healthy in her son; Heart disease in her mother; Hypertension in her father, mother, sister, and sister; Kidney failure in her mother.    ROS:     Review of Systems  Constitutional: Negative.   HENT: Negative.    Eyes: Negative.   Respiratory:  Negative.    Gastrointestinal: Negative.   Genitourinary: Negative.   Musculoskeletal: Negative.   Skin: Negative.   Neurological: Negative.   Endo/Heme/Allergies: Negative.   Psychiatric/Behavioral: Negative.    All other systems reviewed and are negative.     All other systems are reviewed and negative.    PHYSICAL EXAM: VS:  BP 120/82   Pulse 71   Ht 5' 4 (1.626 m)   Wt 141 lb 6.4 oz (64.1 kg)   LMP  (LMP Unknown)   SpO2 99%   BMI 24.27 kg/m  , BMI Body mass index is 24.27 kg/m. Last weight:  Wt Readings from Last 3 Encounters:  09/02/23 141 lb 6.4 oz (64.1 kg)  08/20/23 139 lb (63 kg)  07/08/23 145 lb 3.2 oz (65.9 kg)     Physical Exam Constitutional:      Appearance: Normal appearance.  Cardiovascular:     Rate and Rhythm: Normal rate and regular rhythm.     Heart sounds: Normal heart sounds.  Pulmonary:     Effort: Pulmonary effort is normal.     Breath sounds: Normal breath sounds.  Musculoskeletal:     Right lower leg: No edema.     Left lower leg: No edema.  Neurological:     Mental Status: She is alert.       EKG:   Recent Labs: 03/01/2023: ALT 20; BUN 14; Creatinine, Ser 1.03; Potassium 4.0; Sodium 144 06/20/2023: Hemoglobin 11.4; Platelets 309    Lipid Panel    Component Value Date/Time   CHOL 208 (H) 06/20/2023 0855   TRIG 78 06/20/2023 0855   HDL 70 06/20/2023 0855   CHOLHDL 3.0 06/20/2023 0855   CHOLHDL 4.0 02/27/2017 0237   VLDL 14 02/27/2017 0237   LDLCALC 124 (H) 06/20/2023 0855      Other studies Reviewed: Additional studies/ records that were reviewed today include:  Review of the above records demonstrates:       No data to display            ASSESSMENT AND PLAN:    ICD-10-CM   1. Other chest pain  R07.89 PCV ECHOCARDIOGRAM COMPLETE    MYOCARDIAL PERFUSION IMAGING    pantoprazole  (PROTONIX ) 40 MG tablet   Had chest pains, and went [facility], r/o for MI. Advise echo and stress test. start protonix  40 instead of  omperazole.    2. Mixed hyperlipidemia  E78.2 PCV ECHOCARDIOGRAM COMPLETE    MYOCARDIAL PERFUSION IMAGING    pantoprazole  (PROTONIX ) 40 MG tablet    3. Essential hypertension, benign  I10  PCV ECHOCARDIOGRAM COMPLETE    MYOCARDIAL PERFUSION IMAGING    pantoprazole  (PROTONIX ) 40 MG tablet    4. Stage 3a chronic kidney disease (HCC)  N18.31 PCV ECHOCARDIOGRAM COMPLETE    MYOCARDIAL PERFUSION IMAGING    pantoprazole  (PROTONIX ) 40 MG tablet    5. Gastroesophageal reflux disease without esophagitis  K21.9 PCV ECHOCARDIOGRAM COMPLETE    MYOCARDIAL PERFUSION IMAGING    pantoprazole  (PROTONIX ) 40 MG tablet    6. Idiopathic gout, unspecified chronicity, unspecified site  M10.00 PCV ECHOCARDIOGRAM COMPLETE    MYOCARDIAL PERFUSION IMAGING    pantoprazole  (PROTONIX ) 40 MG tablet       Problem List Items Addressed This Visit       Cardiovascular and Mediastinum   Essential hypertension, benign   Relevant Medications   pantoprazole  (PROTONIX ) 40 MG tablet   Other Relevant Orders   PCV ECHOCARDIOGRAM COMPLETE   MYOCARDIAL PERFUSION IMAGING     Digestive   Gastroesophageal reflux disease without esophagitis   Relevant Medications   pantoprazole  (PROTONIX ) 40 MG tablet   Other Relevant Orders   PCV ECHOCARDIOGRAM COMPLETE   MYOCARDIAL PERFUSION IMAGING     Genitourinary   Chronic kidney disease   Relevant Medications   pantoprazole  (PROTONIX ) 40 MG tablet   Other Relevant Orders   PCV ECHOCARDIOGRAM COMPLETE   MYOCARDIAL PERFUSION IMAGING     Other   Hyperlipemia   Relevant Medications   pantoprazole  (PROTONIX ) 40 MG tablet   Other Relevant Orders   PCV ECHOCARDIOGRAM COMPLETE   MYOCARDIAL PERFUSION IMAGING   Gout   Relevant Medications   pantoprazole  (PROTONIX ) 40 MG tablet   Other Relevant Orders   PCV ECHOCARDIOGRAM COMPLETE   MYOCARDIAL PERFUSION IMAGING   Other Visit Diagnoses       Other chest pain    -  Primary   Had chest pains, and went [facility], r/o for MI.  Advise echo and stress test. start protonix  40 instead of omperazole.   Relevant Medications   pantoprazole  (PROTONIX ) 40 MG tablet   Other Relevant Orders   PCV ECHOCARDIOGRAM COMPLETE   MYOCARDIAL PERFUSION IMAGING          Disposition:   Return in about 4 weeks (around 09/30/2023) for echo, stress test and f/u.    Total time spent: 45 minutes  Signed,  Denyse Bathe, MD  09/02/2023 11:49 AM    [facility]

## 2023-09-11 ENCOUNTER — Encounter

## 2023-09-16 ENCOUNTER — Other Ambulatory Visit: Payer: Self-pay | Admitting: Cardiology

## 2023-09-17 ENCOUNTER — Ambulatory Visit: Payer: Medicare Other | Admitting: Neurology

## 2023-09-24 ENCOUNTER — Other Ambulatory Visit: Payer: Self-pay | Admitting: Cardiovascular Disease

## 2023-09-24 DIAGNOSIS — I10 Essential (primary) hypertension: Secondary | ICD-10-CM

## 2023-09-24 DIAGNOSIS — M1000 Idiopathic gout, unspecified site: Secondary | ICD-10-CM

## 2023-09-24 DIAGNOSIS — N1831 Chronic kidney disease, stage 3a: Secondary | ICD-10-CM

## 2023-09-24 DIAGNOSIS — E782 Mixed hyperlipidemia: Secondary | ICD-10-CM

## 2023-09-24 DIAGNOSIS — R0789 Other chest pain: Secondary | ICD-10-CM

## 2023-09-24 DIAGNOSIS — K219 Gastro-esophageal reflux disease without esophagitis: Secondary | ICD-10-CM

## 2023-09-27 ENCOUNTER — Other Ambulatory Visit

## 2023-09-30 ENCOUNTER — Ambulatory Visit: Admitting: Cardiovascular Disease

## 2023-10-08 ENCOUNTER — Encounter: Payer: Self-pay | Admitting: Internal Medicine

## 2023-10-08 ENCOUNTER — Ambulatory Visit (INDEPENDENT_AMBULATORY_CARE_PROVIDER_SITE_OTHER): Admitting: Internal Medicine

## 2023-10-08 ENCOUNTER — Ambulatory Visit: Payer: Self-pay | Admitting: Internal Medicine

## 2023-10-08 VITALS — BP 124/70 | HR 61 | Ht 64.0 in | Wt 140.2 lb

## 2023-10-08 DIAGNOSIS — R7303 Prediabetes: Secondary | ICD-10-CM | POA: Diagnosis not present

## 2023-10-08 DIAGNOSIS — E782 Mixed hyperlipidemia: Secondary | ICD-10-CM

## 2023-10-08 DIAGNOSIS — G8929 Other chronic pain: Secondary | ICD-10-CM

## 2023-10-08 DIAGNOSIS — I10 Essential (primary) hypertension: Secondary | ICD-10-CM

## 2023-10-08 DIAGNOSIS — N1831 Chronic kidney disease, stage 3a: Secondary | ICD-10-CM | POA: Diagnosis not present

## 2023-10-08 DIAGNOSIS — M5106 Intervertebral disc disorders with myelopathy, lumbar region: Secondary | ICD-10-CM

## 2023-10-08 DIAGNOSIS — R0789 Other chest pain: Secondary | ICD-10-CM

## 2023-10-08 DIAGNOSIS — K219 Gastro-esophageal reflux disease without esophagitis: Secondary | ICD-10-CM

## 2023-10-08 DIAGNOSIS — M5417 Radiculopathy, lumbosacral region: Secondary | ICD-10-CM

## 2023-10-08 DIAGNOSIS — R8281 Pyuria: Secondary | ICD-10-CM | POA: Insufficient documentation

## 2023-10-08 DIAGNOSIS — M1000 Idiopathic gout, unspecified site: Secondary | ICD-10-CM

## 2023-10-08 DIAGNOSIS — M25512 Pain in left shoulder: Secondary | ICD-10-CM

## 2023-10-08 LAB — POCT URINALYSIS DIPSTICK
Bilirubin, UA: NEGATIVE
Blood, UA: NEGATIVE
Glucose, UA: NEGATIVE
Ketones, UA: NEGATIVE
Nitrite, UA: NEGATIVE
Protein, UA: POSITIVE — AB
Spec Grav, UA: 1.025 (ref 1.010–1.025)
Urobilinogen, UA: 1 U/dL
pH, UA: 6 (ref 5.0–8.0)

## 2023-10-08 MED ORDER — ALLOPURINOL 100 MG PO TABS: 100.0000 mg | ORAL_TABLET | Freq: Every day | ORAL | 3 refills | Status: AC

## 2023-10-08 MED ORDER — PANTOPRAZOLE SODIUM 40 MG PO TBEC: 40.0000 mg | DELAYED_RELEASE_TABLET | Freq: Every day | ORAL | 1 refills | Status: AC

## 2023-10-08 MED ORDER — GABAPENTIN 100 MG PO CAPS: 100.0000 mg | ORAL_CAPSULE | Freq: Two times a day (BID) | ORAL | 2 refills | Status: AC

## 2023-10-08 NOTE — Progress Notes (Signed)
 Established Patient Office Visit  Subjective:  Patient ID: Anna Chambers, female    DOB: Dec 22, 1940  Age: 83 y.o. MRN: [id number]  Chief Complaint  Patient presents with   Follow-up    3 month follow up    Patient seen today for follow up. She has chronic low back pain that now radiates to her right hip. Has had multiple oral steroids course and reported symptom resolution on steroids that then return shortly after finishing steroids. Will send referral to orthopedics. 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 Denies numbness and tingling going down her right leg. She also reports left shoulder stiffness; encouraged to continue her shoulder exercises to prevent frozen shoulder. Patient has  a prescription for Gabapentin  but stopped taking it. However will restart gabapentin . Continue taking Meloxicam  as prescribed.  She states she had right cornea replacement done at [facility] and her vision has improved; she is to FU with them in 1 year from last visit. She is still getting annual eye exams. She sees neurology for seizure management and is managed well on Keppra .  Patient reports chest burning sensation with meals. Will restart her Protonix  40 mg once daily for GERD.  Patient has complaint of urine recently being foamy. Will collect UA today. UA came back with trace leukocytes; will send C&S to rule out UTI. Patient denies any burning or pain with urination, or urinary frequency.    No other concerns at this time.   Past Medical History:  Diagnosis Date   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HCC)    Chronic kidney disease 01/22/2019   Depression    Hypertension    Seizure (HCC) 02/25/2017   Sleep apnea    Syncope and collapse 02/25/2017    Past Surgical History:  Procedure Laterality Date   CATARACT EXTRACTION     CATARACT EXTRACTION W/PHACO Left 04/12/2021   Procedure: CATARACT  EXTRACTION PHACO AND INTRAOCULAR LENS PLACEMENT (IOC) LEFT 8.12 01:13.1;  Surgeon: Mittie Gaskin, MD;  Location: [facility];  Service: Ophthalmology;  Laterality: Left;   COLONOSCOPY     COLONOSCOPY WITH PROPOFOL  N/A 12/04/2018   Procedure: COLONOSCOPY WITH PROPOFOL ;  Surgeon: Toledo, Ladell POUR, MD;  Location: [facility];  Service: Gastroenterology;  Laterality: N/A;   EYE SURGERY     FOOT SURGERY Left    NECK SURGERY     SPINE SURGERY     ACDF C3-4   VAGINAL HYSTERECTOMY      Social History   Socioeconomic History   Marital status: Married    Spouse name: Not on file   Number of children: 1   Years of education: Not on file   Highest education level: Not on file  Occupational History   Occupation: retired  Tobacco Use   Smoking status: Never   Smokeless tobacco: Never  Vaping Use   Vaping status: Never Used  Substance and Sexual Activity   Alcohol use: No    Alcohol/week: 0.0 standard drinks of alcohol   Drug use: No   Sexual activity: Not on file  Other Topics Concern   Not on file  Social History Narrative   Lives with husband in a one story home.  Has one child.     Retired from  [employer] and [employer].     Right handed   Social Drivers of Corporate investment banker Strain: Not on file  Food  Insecurity: No Food Insecurity (11/07/2022)   Hunger Vital Sign    Worried About Running Out of Food in the Last Year: Never true    Ran Out of Food in the Last Year: Never true  Transportation Needs: No Transportation Needs (11/07/2022)   PRAPARE - Administrator, Civil Service (Medical): No    Lack of Transportation (Non-Medical): No  Physical Activity: Not on file  Stress: Not on file  Social Connections: Not on file  Intimate Partner Violence: Not At Risk (03/01/2022)   Humiliation, Afraid, Rape, and Kick questionnaire    Fear of Current or Ex-Partner: No    Emotionally Abused: No    Physically Abused: No    Sexually Abused: No    Family  History  Problem Relation Age of Onset   Hypertension Mother    Kidney failure Mother    Heart disease Mother    Hypertension Father    Cancer Father    Hypertension Sister    Hypertension Sister    Healthy Son    Breast cancer Neg Hx     Allergies  Allergen Reactions   Simvastatin Rash    Outpatient Medications Prior to Visit  Medication Sig   brimonidine -timolol  (COMBIGAN ) 0.2-0.5 % ophthalmic solution Administer 10 drops to both eyes Two (2) times a day. Frequency:PHARMDIR   Dosage:36ml     Instructions:  Note:Instill one drop in each eye twice daily. Dose: 1   brinzolamide  (AZOPT ) 1 % ophthalmic suspension Administer 1 drop to both eyes Two (2) times a day (at 8am and 5pm). Frequency:PHARMDIR   Dosage:0.0     Instructions:  Note:Instill one drop in each eye twice a day. Dose: 1   Cholecalciferol  (VITAMIN D3) 1.25 MG (50000 UT) CAPS TAKE ONE CAPSULE BY MOUTH ONCE A WEEK   diclofenac  Sodium (VOLTAREN ) 1 % GEL Apply 2 g topically 2 (two) times daily.   latanoprost  (XALATAN ) 0.005 % ophthalmic solution Administer 2.5 mL to both eyes nightly. Frequency:QD   Dosage:0.0     Instructions:  Note:Dose: 0.005 %   losartan  (COZAAR ) 100 MG tablet Take 1 tablet (100 mg total) by mouth daily.   meloxicam  (MOBIC ) 15 MG tablet TAKE 1 TABLET (15 MG TOTAL) BY MOUTH DAILY.   oxybutynin  (DITROPAN -XL) 5 MG 24 hr tablet TAKE 1 TABLET BY MOUTH EVERY DAY   prednisoLONE  acetate (PRED FORTE ) 1 % ophthalmic suspension Place 1 drop into the left eye 4 (four) times daily.   [DISCONTINUED] allopurinol  (ZYLOPRIM ) 100 MG tablet Take 1 tablet (100 mg total) by mouth daily.   baclofen  (LIORESAL ) 10 MG tablet Take 1 tablet (10 mg total) by mouth at bedtime. (Patient not taking: Reported on 10/08/2023)   levETIRAcetam  (KEPPRA ) 500 MG tablet Take 1 tablet (500 mg total) by mouth 2 (two) times daily. (Patient not taking: Reported on 10/08/2023)   torsemide (DEMADEX) 10 MG tablet Take 10 mg by mouth daily. Take Monday,  Wednesday, and Friday (Patient not taking: Reported on 10/08/2023)   [DISCONTINUED] gabapentin  (NEURONTIN ) 100 MG capsule Take 1 capsule (100 mg total) by mouth 2 (two) times daily. (Patient not taking: Reported on 10/08/2023)   [DISCONTINUED] pantoprazole  (PROTONIX ) 40 MG tablet TAKE 1 TABLET BY MOUTH EVERY DAY (Patient not taking: Reported on 10/08/2023)   No facility-administered medications prior to visit.    Review of Systems  Constitutional: Negative.  Negative for chills, fever, malaise/fatigue and weight loss.  HENT: Negative.    Eyes: Negative.   Respiratory: Negative.  Negative  for cough and shortness of breath.   Cardiovascular: Negative.  Negative for chest pain, palpitations and leg swelling.  Gastrointestinal: Negative.  Negative for abdominal pain, constipation, diarrhea, heartburn, nausea and vomiting.  Genitourinary:  Negative for dysuria and flank pain.       foamy urine  Musculoskeletal:  Positive for back pain (lumbar region) and joint pain (right hip). Negative for myalgias.  Skin: Negative.   Neurological: Negative.  Negative for dizziness and headaches.  Endo/Heme/Allergies: Negative.   Psychiatric/Behavioral: Negative.  Negative for depression and suicidal ideas. The patient is not nervous/anxious.        Objective:   BP 124/70   Pulse 61   Ht 5' 4 (1.626 m)   Wt 140 lb 3.2 oz (63.6 kg)   LMP  (LMP Unknown)   SpO2 98%   BMI 24.07 kg/m   Vitals:   10/08/23 1004  BP: 124/70  Pulse: 61  Height: 5' 4 (1.626 m)  Weight: 140 lb 3.2 oz (63.6 kg)  SpO2: 98%  BMI (Calculated): 24.05    Physical Exam Vitals and nursing note reviewed.  Constitutional:      Appearance: Normal appearance.  HENT:     Head: Normocephalic and atraumatic.     Nose: Nose normal.     Mouth/Throat:     Mouth: Mucous membranes are moist.     Pharynx: Oropharynx is clear.  Eyes:     Conjunctiva/sclera: Conjunctivae normal.     Pupils: Pupils are equal, round, and reactive to  light.  Cardiovascular:     Rate and Rhythm: Normal rate and regular rhythm.     Pulses: Normal pulses.     Heart sounds: Normal heart sounds. No murmur heard. Pulmonary:     Effort: Pulmonary effort is normal.     Breath sounds: Normal breath sounds. No wheezing.  Abdominal:     General: Bowel sounds are normal.     Palpations: Abdomen is soft.     Tenderness: There is no abdominal tenderness. There is no right CVA tenderness or left CVA tenderness.  Musculoskeletal:        General: Normal range of motion.     Cervical back: Normal range of motion.     Right lower leg: No edema.     Left lower leg: No edema.  Skin:    General: Skin is warm and dry.  Neurological:     General: No focal deficit present.     Mental Status: She is alert and oriented to person, place, and time.  Psychiatric:        Mood and Affect: Mood normal.        Behavior: Behavior normal.      Results for orders placed or performed in visit on 10/08/23  POCT Urinalysis Dipstick (81002)  Result Value Ref Range   Color, UA Yellow    Clarity, UA Turbid    Glucose, UA Negative Negative   Bilirubin, UA Negative    Ketones, UA Negative    Spec Grav, UA 1.025 1.010 - 1.025   Blood, UA Negative    pH, UA 6.0 5.0 - 8.0   Protein, UA Positive (A) Negative   Urobilinogen, UA 1.0 0.2 or 1.0 E.U./dL   Nitrite, UA Negative    Leukocytes, UA Trace (A) Negative   Appearance Turbid    Odor Yes     Recent Results (from the past 2160 hours)  POCT Urinalysis Dipstick (18997)     Status: Abnormal   Collection  Time: 10/08/23 10:52 AM  Result Value Ref Range   Color, UA Yellow    Clarity, UA Turbid    Glucose, UA Negative Negative   Bilirubin, UA Negative    Ketones, UA Negative    Spec Grav, UA 1.025 1.010 - 1.025   Blood, UA Negative    pH, UA 6.0 5.0 - 8.0   Protein, UA Positive (A) Negative   Urobilinogen, UA 1.0 0.2 or 1.0 E.U./dL   Nitrite, UA Negative    Leukocytes, UA Trace (A) Negative   Appearance  Turbid    Odor Yes       Assessment & Plan:  Continue medications as prescribed. Will collect routine labs and follow up with patient on results. Refill medications. Problem List Items Addressed This Visit     Lumbosacral radiculopathy   Relevant Medications   gabapentin  (NEURONTIN ) 100 MG capsule   Chronic kidney disease - Primary   Relevant Medications   pantoprazole  (PROTONIX ) 40 MG tablet   Other Relevant Orders   CBC with Diff   CMP14+EGFR   Hyperlipemia   Relevant Medications   pantoprazole  (PROTONIX ) 40 MG tablet   Other Relevant Orders   Lipid Panel w/o Chol/HDL Ratio   Essential hypertension, benign   Relevant Medications   pantoprazole  (PROTONIX ) 40 MG tablet   Other Relevant Orders   CBC with Diff   CMP14+EGFR   Lumbar disc disorder with myelopathy   Relevant Medications   gabapentin  (NEURONTIN ) 100 MG capsule   Other Relevant Orders   POCT Urinalysis Dipstick (18997) (Completed)   Gout   Relevant Medications   allopurinol  (ZYLOPRIM ) 100 MG tablet   pantoprazole  (PROTONIX ) 40 MG tablet   Chronic left shoulder pain   Relevant Medications   gabapentin  (NEURONTIN ) 100 MG capsule   Prediabetes   Gastroesophageal reflux disease without esophagitis   Relevant Medications   pantoprazole  (PROTONIX ) 40 MG tablet   Other Visit Diagnoses       Other chest pain       Had chest pains, and went [facility], r/o for MI. Advise echo and stress test. start protonix  40 instead of omperazole.   Relevant Medications   pantoprazole  (PROTONIX ) 40 MG tablet       Return in about 2 weeks (around 10/22/2023).   Total time spent: 30 minutes  FERNAND FREDY RAMAN, MD  10/08/2023   This document may have been prepared by [facility] Voice Recognition software and as such may include unintentional dictation errors.

## 2023-10-09 LAB — URINALYSIS
Bilirubin, UA: NEGATIVE
Glucose, UA: NEGATIVE
Ketones, UA: NEGATIVE
Nitrite, UA: NEGATIVE
RBC, UA: NEGATIVE
Specific Gravity, UA: 1.022 (ref 1.005–1.030)
Urobilinogen, Ur: 1 mg/dL (ref 0.2–1.0)
pH, UA: 6 (ref 5.0–7.5)

## 2023-10-09 LAB — CMP14+EGFR
ALT: 11 IU/L (ref 0–32)
AST: 18 IU/L (ref 0–40)
Albumin: 4.2 g/dL (ref 3.7–4.7)
Alkaline Phosphatase: 114 IU/L (ref 44–121)
BUN/Creatinine Ratio: 17 (ref 12–28)
BUN: 20 mg/dL (ref 8–27)
Bilirubin Total: 0.8 mg/dL (ref 0.0–1.2)
CO2: 21 mmol/L (ref 20–29)
Calcium: 9.7 mg/dL (ref 8.7–10.3)
Chloride: 106 mmol/L (ref 96–106)
Creatinine, Ser: 1.18 mg/dL — ABNORMAL HIGH (ref 0.57–1.00)
Globulin, Total: 2.4 g/dL (ref 1.5–4.5)
Glucose: 96 mg/dL (ref 70–99)
Potassium: 3.9 mmol/L (ref 3.5–5.2)
Sodium: 145 mmol/L — ABNORMAL HIGH (ref 134–144)
Total Protein: 6.6 g/dL (ref 6.0–8.5)
eGFR: 46 mL/min/1.73 — ABNORMAL LOW (ref >59)

## 2023-10-09 LAB — LIPID PANEL W/O CHOL/HDL RATIO
Cholesterol, Total: 212 mg/dL — ABNORMAL HIGH (ref 100–199)
HDL: 70 mg/dL (ref >39)
LDL Chol Calc (NIH): 128 mg/dL — ABNORMAL HIGH (ref 0–99)
Triglycerides: 80 mg/dL (ref 0–149)
VLDL Cholesterol Cal: 14 mg/dL (ref 5–40)

## 2023-10-09 LAB — CBC WITH DIFFERENTIAL/PLATELET
Basophils Absolute: 0 x10E3/uL (ref 0.0–0.2)
Basos: 0 %
EOS (ABSOLUTE): 0.2 x10E3/uL (ref 0.0–0.4)
Eos: 3 %
Hematocrit: 39 % (ref 34.0–46.6)
Hemoglobin: 12 g/dL (ref 11.1–15.9)
Immature Grans (Abs): 0 x10E3/uL (ref 0.0–0.1)
Immature Granulocytes: 0 %
Lymphocytes Absolute: 2.7 x10E3/uL (ref 0.7–3.1)
Lymphs: 35 %
MCH: 26.7 pg (ref 26.6–33.0)
MCHC: 30.8 g/dL — ABNORMAL LOW (ref 31.5–35.7)
MCV: 87 fL (ref 79–97)
Monocytes Absolute: 0.7 x10E3/uL (ref 0.1–0.9)
Monocytes: 9 %
Neutrophils Absolute: 4 x10E3/uL (ref 1.4–7.0)
Neutrophils: 53 %
Platelets: 253 x10E3/uL (ref 150–450)
RBC: 4.49 x10E6/uL (ref 3.77–5.28)
RDW: 14.5 % (ref 11.7–15.4)
WBC: 7.5 x10E3/uL (ref 3.4–10.8)

## 2023-10-10 LAB — URINE CULTURE

## 2023-10-15 NOTE — Progress Notes (Signed)
Pt informed

## 2023-10-22 ENCOUNTER — Encounter: Payer: Self-pay | Admitting: Internal Medicine

## 2023-10-22 ENCOUNTER — Ambulatory Visit (INDEPENDENT_AMBULATORY_CARE_PROVIDER_SITE_OTHER): Admitting: Internal Medicine

## 2023-10-22 VITALS — BP 122/66 | HR 60 | Ht 64.0 in | Wt 140.0 lb

## 2023-10-22 DIAGNOSIS — M5417 Radiculopathy, lumbosacral region: Secondary | ICD-10-CM

## 2023-10-22 DIAGNOSIS — I10 Essential (primary) hypertension: Secondary | ICD-10-CM | POA: Diagnosis not present

## 2023-10-22 DIAGNOSIS — M5106 Intervertebral disc disorders with myelopathy, lumbar region: Secondary | ICD-10-CM | POA: Diagnosis not present

## 2023-10-22 DIAGNOSIS — Z981 Arthrodesis status: Secondary | ICD-10-CM

## 2023-10-22 DIAGNOSIS — M5030 Other cervical disc degeneration, unspecified cervical region: Secondary | ICD-10-CM

## 2023-10-22 DIAGNOSIS — E559 Vitamin D deficiency, unspecified: Secondary | ICD-10-CM | POA: Insufficient documentation

## 2023-10-22 DIAGNOSIS — R7303 Prediabetes: Secondary | ICD-10-CM

## 2023-10-22 DIAGNOSIS — N1831 Chronic kidney disease, stage 3a: Secondary | ICD-10-CM

## 2023-10-22 DIAGNOSIS — G8929 Other chronic pain: Secondary | ICD-10-CM

## 2023-10-22 DIAGNOSIS — M25512 Pain in left shoulder: Secondary | ICD-10-CM

## 2023-10-22 NOTE — Progress Notes (Signed)
 Established Patient Office Visit  Subjective:  Patient ID: Anna Chambers, female    DOB: 1940-06-10  Age: 83 y.o. MRN: [id number]  Chief Complaint  Patient presents with   Follow-up    2 week follow up    Patient is here today to follow up. She was encouraged to restart her Gabapentin  and Protonix  at her last appointment. She reports her GERD has improved with Protonix . Patient endorses Gabapentin  doesn't seem to be helping her joint pain. Will increase dose to Gabapentin  200 mg at bedtime and remain on 100 mg in the morning. Will send orthopedic referral for chronic back pain- Lumbar Radiculopathy.  Patient asked if she needed to continue taking vitamin D  supplementation. Will add vitamin D  lab to next routine blood work. She wants to get a flu shot this season. She got pneumonia vaccines in 2016 and 2021 so she is currently UTD on that; encouraged patient to get Shingrix vaccine series, she is also due for Td. Overall she is doing well and has no new complaints today. She denies headache, chest pain, shortness of breath, abdominal pain, nausea, vomiting, diarrhea/constipation.    No other concerns at this time.   Past Medical History:  Diagnosis Date   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HCC)    Chronic kidney disease 01/22/2019   Depression    Hypertension    Seizure (HCC) 02/25/2017   Sleep apnea    Syncope and collapse 02/25/2017    Past Surgical History:  Procedure Laterality Date   CATARACT EXTRACTION     CATARACT EXTRACTION W/PHACO Left 04/12/2021   Procedure: CATARACT EXTRACTION PHACO AND INTRAOCULAR LENS PLACEMENT (IOC) LEFT 8.12 01:13.1;  Surgeon: Mittie Gaskin, MD;  Location: [facility];  Service: Ophthalmology;  Laterality: Left;   COLONOSCOPY     COLONOSCOPY WITH PROPOFOL  N/A 12/04/2018   Procedure: COLONOSCOPY WITH PROPOFOL ;  Surgeon: Toledo, Ladell POUR, MD;  Location: [facility];  Service: Gastroenterology;  Laterality: N/A;   EYE  SURGERY     FOOT SURGERY Left    NECK SURGERY     SPINE SURGERY     ACDF C3-4   VAGINAL HYSTERECTOMY      Social History   Socioeconomic History   Marital status: Married    Spouse name: Not on file   Number of children: 1   Years of education: Not on file   Highest education level: Not on file  Occupational History   Occupation: retired  Tobacco Use   Smoking status: Never   Smokeless tobacco: Never  Vaping Use   Vaping status: Never Used  Substance and Sexual Activity   Alcohol use: No    Alcohol/week: 0.0 standard drinks of alcohol   Drug use: No   Sexual activity: Not on file  Other Topics Concern   Not on file  Social History Narrative   Lives with husband in a one story home.  Has one child.     Retired from  [employer] and [employer].     Right handed   Social Drivers of Health   Financial Resource Strain: Not on file  Food Insecurity: No Food Insecurity (11/07/2022)   Hunger Vital Sign    Worried About Running Out of Food in the Last Year: Never true    Ran Out of Food in the Last Year: Never true  Transportation Needs: No Transportation Needs (11/07/2022)   PRAPARE - Administrator, Civil Service (Medical): No    Lack of Transportation (Non-Medical):  No  Physical Activity: Not on file  Stress: Not on file  Social Connections: Not on file  Intimate Partner Violence: Not At Risk (03/01/2022)   Humiliation, Afraid, Rape, and Kick questionnaire    Fear of Current or Ex-Partner: No    Emotionally Abused: No    Physically Abused: No    Sexually Abused: No    Family History  Problem Relation Age of Onset   Hypertension Mother    Kidney failure Mother    Heart disease Mother    Hypertension Father    Cancer Father    Hypertension Sister    Hypertension Sister    Healthy Son    Breast cancer Neg Hx     Allergies  Allergen Reactions   Simvastatin Rash    Outpatient Medications Prior to Visit  Medication Sig   allopurinol  (ZYLOPRIM ) 100 MG tablet  Take 1 tablet (100 mg total) by mouth daily.   brimonidine -timolol  (COMBIGAN ) 0.2-0.5 % ophthalmic solution Administer 10 drops to both eyes Two (2) times a day. Frequency:PHARMDIR   Dosage:72ml     Instructions:  Note:Instill one drop in each eye twice daily. Dose: 1   brinzolamide  (AZOPT ) 1 % ophthalmic suspension Administer 1 drop to both eyes Two (2) times a day (at 8am and 5pm). Frequency:PHARMDIR   Dosage:0.0     Instructions:  Note:Instill one drop in each eye twice a day. Dose: 1   diclofenac  Sodium (VOLTAREN ) 1 % GEL Apply 2 g topically 2 (two) times daily.   gabapentin  (NEURONTIN ) 100 MG capsule Take 1 capsule (100 mg total) by mouth 2 (two) times daily.   latanoprost  (XALATAN ) 0.005 % ophthalmic solution Administer 2.5 mL to both eyes nightly. Frequency:QD   Dosage:0.0     Instructions:  Note:Dose: 0.005 %   levETIRAcetam  (KEPPRA ) 500 MG tablet Take 1 tablet (500 mg total) by mouth 2 (two) times daily.   losartan  (COZAAR ) 100 MG tablet Take 1 tablet (100 mg total) by mouth daily.   oxybutynin  (DITROPAN -XL) 5 MG 24 hr tablet TAKE 1 TABLET BY MOUTH EVERY DAY   pantoprazole  (PROTONIX ) 40 MG tablet Take 1 tablet (40 mg total) by mouth daily.   prednisoLONE  acetate (PRED FORTE ) 1 % ophthalmic suspension Place 1 drop into the left eye 4 (four) times daily.   baclofen  (LIORESAL ) 10 MG tablet Take 1 tablet (10 mg total) by mouth at bedtime. (Patient not taking: Reported on 10/22/2023)   meloxicam  (MOBIC ) 15 MG tablet TAKE 1 TABLET (15 MG TOTAL) BY MOUTH DAILY. (Patient not taking: Reported on 10/22/2023)   torsemide (DEMADEX) 10 MG tablet Take 10 mg by mouth daily. Take Monday, Wednesday, and Friday (Patient not taking: Reported on 10/22/2023)   [DISCONTINUED] Cholecalciferol  (VITAMIN D3) 1.25 MG (50000 UT) CAPS TAKE ONE CAPSULE BY MOUTH ONCE A WEEK (Patient not taking: Reported on 10/22/2023)   No facility-administered medications prior to visit.    Review of Systems  Constitutional: Negative.   Negative for chills, fever and malaise/fatigue.  HENT: Negative.  Negative for congestion and sore throat.   Eyes: Negative.  Negative for blurred vision and pain.  Respiratory: Negative.  Negative for cough and shortness of breath.   Cardiovascular: Negative.  Negative for chest pain, palpitations and leg swelling.  Gastrointestinal: Negative.  Negative for abdominal pain, blood in stool, constipation, diarrhea, heartburn, melena, nausea and vomiting.  Genitourinary: Negative.  Negative for dysuria, flank pain, frequency and urgency.  Musculoskeletal:  Positive for back pain and joint pain. Negative for  myalgias.  Skin: Negative.   Neurological: Negative.  Negative for dizziness, tingling, sensory change, weakness and headaches.  Endo/Heme/Allergies: Negative.   Psychiatric/Behavioral: Negative.  Negative for depression and suicidal ideas. The patient is not nervous/anxious.        Objective:   BP 122/66   Pulse 60   Ht 5' 4 (1.626 m)   Wt 140 lb (63.5 kg)   LMP  (LMP Unknown)   SpO2 99%   BMI 24.03 kg/m   Vitals:   10/22/23 1052  BP: 122/66  Pulse: 60  Height: 5' 4 (1.626 m)  Weight: 140 lb (63.5 kg)  SpO2: 99%  BMI (Calculated): 24.02    Physical Exam Vitals and nursing note reviewed.  Constitutional:      Appearance: Normal appearance.  HENT:     Head: Normocephalic and atraumatic.     Nose: Nose normal.     Mouth/Throat:     Mouth: Mucous membranes are moist.     Pharynx: Oropharynx is clear.  Eyes:     Conjunctiva/sclera: Conjunctivae normal.     Pupils: Pupils are equal, round, and reactive to light.  Cardiovascular:     Rate and Rhythm: Normal rate and regular rhythm.     Pulses: Normal pulses.     Heart sounds: Normal heart sounds. No murmur heard. Pulmonary:     Effort: Pulmonary effort is normal.     Breath sounds: Normal breath sounds. No wheezing.  Abdominal:     General: Bowel sounds are normal.     Palpations: Abdomen is soft.      Tenderness: There is no abdominal tenderness. There is no right CVA tenderness or left CVA tenderness.  Musculoskeletal:        General: Normal range of motion.     Cervical back: Normal range of motion.     Right lower leg: No edema.     Left lower leg: No edema.  Skin:    General: Skin is warm and dry.  Neurological:     General: No focal deficit present.     Mental Status: She is alert and oriented to person, place, and time.  Psychiatric:        Mood and Affect: Mood normal.        Behavior: Behavior normal.      No results found for any visits on 10/22/23.  Recent Results (from the past 2160 hours)  POCT Urinalysis Dipstick (18997)     Status: Abnormal   Collection Time: 10/08/23 10:52 AM  Result Value Ref Range   Color, UA Yellow    Clarity, UA Turbid    Glucose, UA Negative Negative   Bilirubin, UA Negative    Ketones, UA Negative    Spec Grav, UA 1.025 1.010 - 1.025   Blood, UA Negative    pH, UA 6.0 5.0 - 8.0   Protein, UA Positive (A) Negative   Urobilinogen, UA 1.0 0.2 or 1.0 E.U./dL   Nitrite, UA Negative    Leukocytes, UA Trace (A) Negative   Appearance Turbid    Odor Yes   CBC with Diff     Status: Abnormal   Collection Time: 10/08/23 11:26 AM  Result Value Ref Range   WBC 7.5 3.4 - 10.8 x10E3/uL   RBC 4.49 3.77 - 5.28 x10E6/uL   Hemoglobin 12.0 11.1 - 15.9 g/dL   Hematocrit 60.9 65.9 - 46.6 %   MCV 87 79 - 97 fL   MCH 26.7 26.6 - 33.0 pg  MCHC 30.8 (L) 31.5 - 35.7 g/dL   RDW 85.4 88.2 - 84.5 %   Platelets 253 150 - 450 x10E3/uL   Neutrophils 53 Not Estab. %   Lymphs 35 Not Estab. %   Monocytes 9 Not Estab. %   Eos 3 Not Estab. %   Basos 0 Not Estab. %   Neutrophils Absolute 4.0 1.4 - 7.0 x10E3/uL   Lymphocytes Absolute 2.7 0.7 - 3.1 x10E3/uL   Monocytes Absolute 0.7 0.1 - 0.9 x10E3/uL   EOS (ABSOLUTE) 0.2 0.0 - 0.4 x10E3/uL   Basophils Absolute 0.0 0.0 - 0.2 x10E3/uL   Immature Granulocytes 0 Not Estab. %   Immature Grans (Abs) 0.0 0.0 -  0.1 x10E3/uL  CMP14+EGFR     Status: Abnormal   Collection Time: 10/08/23 11:26 AM  Result Value Ref Range   Glucose 96 70 - 99 mg/dL   BUN 20 8 - 27 mg/dL   Creatinine, Ser 8.81 (H) 0.57 - 1.00 mg/dL   eGFR 46 (L) >40 fO/fpw/8.26   BUN/Creatinine Ratio 17 12 - 28   Sodium 145 (H) 134 - 144 mmol/L   Potassium 3.9 3.5 - 5.2 mmol/L   Chloride 106 96 - 106 mmol/L   CO2 21 20 - 29 mmol/L   Calcium  9.7 8.7 - 10.3 mg/dL   Total Protein 6.6 6.0 - 8.5 g/dL   Albumin 4.2 3.7 - 4.7 g/dL   Globulin, Total 2.4 1.5 - 4.5 g/dL   Bilirubin Total 0.8 0.0 - 1.2 mg/dL   Alkaline Phosphatase 114 44 - 121 IU/L    Comment: **Effective October 21, 2023 Alkaline Phosphatase**   reference interval will be changing to:              Age                Female          Female           0 -  5 days         47 - 127       47 - 127           6 - 10 days         29 - 242       29 - 242          11 - 20 days        109 - 357      109 - 357          21 - 30 days         94 - 494       94 - 494           1 -  2 months      149 - 539      149 - 539           3 -  6 months      131 - 452      131 - 452           7 - 11 months      117 - 401      117 - 401   12 months -  6 years       158 - 369      158 - 369           7 - 12 years       150 - 409  150 - 409               13 years       156 - 435       78 - 227               14 years       114 - 375       64 - 161               15 years        88 - 279       56 - 134               16 years        74 - 207       51 - 121               17 years        63 - 161       47 - 113          18 - 20 years        51 - 125       42 - 106          21 - 50 years         47 - 123       41 - 116          51 - 80 years        49 - 135       51 - 125              >80 years        48 - 129       48 - 129    AST 18 0 - 40 IU/L   ALT 11 0 - 32 IU/L  Lipid Panel w/o Chol/HDL Ratio     Status: Abnormal   Collection Time: 10/08/23 11:26 AM  Result Value Ref Range   Cholesterol,  Total 212 (H) 100 - 199 mg/dL   Triglycerides 80 0 - 149 mg/dL   HDL 70 >60 mg/dL   VLDL Cholesterol Cal 14 5 - 40 mg/dL   LDL Chol Calc (NIH) 871 (H) 0 - 99 mg/dL  Urine Culture     Status: None   Collection Time: 10/08/23  3:15 PM   Specimen: Urine   UR  Result Value Ref Range   Urine Culture, Routine Final report    Organism ID, Bacteria Comment     Comment: Mixed urogenital flora 10,000-25,000 colony forming units per mL   Urinalysis     Status: Abnormal   Collection Time: 10/08/23  3:18 PM  Result Value Ref Range   Specific Gravity, UA 1.022 1.005 - 1.030   pH, UA 6.0 5.0 - 7.5   Color, UA Yellow Yellow   Appearance Ur Clear Clear   Leukocytes,UA 2+ (A) Negative   Protein,UA Trace Negative/Trace   Glucose, UA Negative Negative   Ketones, UA Negative Negative   RBC, UA Negative Negative   Bilirubin, UA Negative Negative   Urobilinogen, Ur 1.0 0.2 - 1.0 mg/dL   Nitrite, UA Negative Negative      Assessment & Plan:  Increase Gabapentin  night time dose to 200 mg, continue taking 100 mg in the morning. Will send orthopedic referral. Continue taking other medications as prescribed. Will check routine fasting labs including vitamin D  level at next visit. Problem  List Items Addressed This Visit     Lumbosacral radiculopathy - Primary   Relevant Orders   Ambulatory referral to Orthopedic Surgery   S/P cervical spinal fusion   Relevant Orders   Ambulatory referral to Orthopedic Surgery   Chronic kidney disease   DDD (degenerative disc disease), cervical   Relevant Orders   Ambulatory referral to Orthopedic Surgery   Essential hypertension, benign   Lumbar disc disorder with myelopathy   Relevant Orders   Ambulatory referral to Orthopedic Surgery   Chronic left shoulder pain   Vitamin D  deficiency    Return in about 3 months (around 01/21/2024).   Total time spent: 30 minutes  FERNAND FREDY RAMAN, MD  10/22/2023   This document may have been prepared by [facility]  Voice Recognition software and as such may include unintentional dictation errors.

## 2023-11-27 ENCOUNTER — Other Ambulatory Visit: Payer: Self-pay | Admitting: Internal Medicine

## 2024-01-16 ENCOUNTER — Other Ambulatory Visit

## 2024-01-16 DIAGNOSIS — R7303 Prediabetes: Secondary | ICD-10-CM

## 2024-01-16 DIAGNOSIS — E782 Mixed hyperlipidemia: Secondary | ICD-10-CM

## 2024-01-16 DIAGNOSIS — M1A39X0 Chronic gout due to renal impairment, multiple sites, without tophus (tophi): Secondary | ICD-10-CM

## 2024-01-16 DIAGNOSIS — I10 Essential (primary) hypertension: Secondary | ICD-10-CM

## 2024-01-17 ENCOUNTER — Ambulatory Visit: Payer: Self-pay | Admitting: Internal Medicine

## 2024-01-17 DIAGNOSIS — E559 Vitamin D deficiency, unspecified: Secondary | ICD-10-CM

## 2024-01-17 LAB — CBC WITH DIFFERENTIAL/PLATELET
Basophils Absolute: 0 x10E3/uL (ref 0.0–0.2)
Basos: 1 %
EOS (ABSOLUTE): 0.2 x10E3/uL (ref 0.0–0.4)
Eos: 3 %
Hematocrit: 37.6 % (ref 34.0–46.6)
Hemoglobin: 11.8 g/dL (ref 11.1–15.9)
Immature Grans (Abs): 0 x10E3/uL (ref 0.0–0.1)
Immature Granulocytes: 0 %
Lymphocytes Absolute: 2.4 x10E3/uL (ref 0.7–3.1)
Lymphs: 37 %
MCH: 27.5 pg (ref 26.6–33.0)
MCHC: 31.4 g/dL — ABNORMAL LOW (ref 31.5–35.7)
MCV: 88 fL (ref 79–97)
Monocytes Absolute: 0.5 x10E3/uL (ref 0.1–0.9)
Monocytes: 8 %
Neutrophils Absolute: 3.3 x10E3/uL (ref 1.4–7.0)
Neutrophils: 51 %
Platelets: 250 x10E3/uL (ref 150–450)
RBC: 4.29 x10E6/uL (ref 3.77–5.28)
RDW: 13.9 % (ref 11.7–15.4)
WBC: 6.4 x10E3/uL (ref 3.4–10.8)

## 2024-01-17 LAB — CMP14+EGFR
ALT: 16 IU/L (ref 0–32)
AST: 18 IU/L (ref 0–40)
Albumin: 4.5 g/dL (ref 3.7–4.7)
Alkaline Phosphatase: 98 IU/L (ref 48–129)
BUN/Creatinine Ratio: 21 (ref 12–28)
BUN: 18 mg/dL (ref 8–27)
Bilirubin Total: 0.7 mg/dL (ref 0.0–1.2)
CO2: 26 mmol/L (ref 20–29)
Calcium: 9.7 mg/dL (ref 8.7–10.3)
Chloride: 106 mmol/L (ref 96–106)
Creatinine, Ser: 0.87 mg/dL (ref 0.57–1.00)
Globulin, Total: 2.5 g/dL (ref 1.5–4.5)
Glucose: 84 mg/dL (ref 70–99)
Potassium: 3.6 mmol/L (ref 3.5–5.2)
Sodium: 144 mmol/L (ref 134–144)
Total Protein: 7 g/dL (ref 6.0–8.5)
eGFR: 66 mL/min/1.73 (ref >59)

## 2024-01-17 LAB — URIC ACID: Uric Acid: 3.7 mg/dL (ref 3.1–7.9)

## 2024-01-17 LAB — LIPID PANEL W/O CHOL/HDL RATIO
Cholesterol, Total: 233 mg/dL — ABNORMAL HIGH (ref 100–199)
HDL: 74 mg/dL (ref >39)
LDL Chol Calc (NIH): 143 mg/dL — ABNORMAL HIGH (ref 0–99)
Triglycerides: 95 mg/dL (ref 0–149)
VLDL Cholesterol Cal: 16 mg/dL (ref 5–40)

## 2024-01-17 LAB — HEMOGLOBIN A1C
Est. average glucose Bld gHb Est-mCnc: 108 mg/dL
Hgb A1c MFr Bld: 5.4 % (ref 4.8–5.6)

## 2024-01-21 ENCOUNTER — Ambulatory Visit: Admitting: Internal Medicine

## 2024-01-21 ENCOUNTER — Encounter: Payer: Self-pay | Admitting: Internal Medicine

## 2024-01-21 VITALS — BP 130/84 | HR 57 | Ht 64.0 in | Wt 138.8 lb

## 2024-01-21 DIAGNOSIS — E782 Mixed hyperlipidemia: Secondary | ICD-10-CM

## 2024-01-21 DIAGNOSIS — M5030 Other cervical disc degeneration, unspecified cervical region: Secondary | ICD-10-CM

## 2024-01-21 DIAGNOSIS — E0849 Diabetes mellitus due to underlying condition with other diabetic neurological complication: Secondary | ICD-10-CM | POA: Insufficient documentation

## 2024-01-21 DIAGNOSIS — G959 Disease of spinal cord, unspecified: Secondary | ICD-10-CM

## 2024-01-21 DIAGNOSIS — Z1231 Encounter for screening mammogram for malignant neoplasm of breast: Secondary | ICD-10-CM

## 2024-01-21 MED ORDER — ROSUVASTATIN CALCIUM 5 MG PO TABS: 5.0000 mg | ORAL_TABLET | Freq: Every day | ORAL | 11 refills | Status: AC

## 2024-01-21 NOTE — Progress Notes (Signed)
 Established Patient Office Visit  Subjective:  Patient ID: Anna Chambers, female    DOB: 1940-09-30  Age: 83 y.o. MRN: [id number]  Chief Complaint  Patient presents with   Follow-up    3 month follow up    Patient comes in for her follow-up today.  She has been evaluated by the orthopedic for her back pain and has been started on regular physical therapy sessions.  Patient notes some improvement in her symptoms.  Although she has been prescribed gabapentin  but she is not taking it regularly.  Patient advised to take at least 200 mg at bedtime and if needed take 100 mg in the morning as prescribed. Labs were done recently and the results were discussed today.  Her LDL has consistently gone up despite recommendations for diet control and exercise as tolerated.  Patient is agreeable to starting a small dose statin.  Will send in Crestor  5 mg.  She does have simvastatin listed in her allergies from 2016, as causing a rash.  With patient does not recall that happening and thinks it could have been coincidental with a different medicine.  She is willing to try a small dose of statin at this time.  However if she develops a rash we will switch to Zetia. No complaints of chest pain, no shortness of breath, no palpitations.    No other concerns at this time.   Past Medical History:  Diagnosis Date   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HCC)    Chronic kidney disease 01/22/2019   Depression    Hypertension    Seizure (HCC) 02/25/2017   Sleep apnea    Syncope and collapse 02/25/2017    Past Surgical History:  Procedure Laterality Date   CATARACT EXTRACTION     CATARACT EXTRACTION W/PHACO Left 04/12/2021   Procedure: CATARACT EXTRACTION PHACO AND INTRAOCULAR LENS PLACEMENT (IOC) LEFT 8.12 01:13.1;  Surgeon: Mittie Gaskin, MD;  Location: [facility];  Service: Ophthalmology;  Laterality: Left;   COLONOSCOPY     COLONOSCOPY WITH PROPOFOL  N/A 12/04/2018   Procedure:  COLONOSCOPY WITH PROPOFOL ;  Surgeon: Toledo, Ladell POUR, MD;  Location: [facility];  Service: Gastroenterology;  Laterality: N/A;   EYE SURGERY     FOOT SURGERY Left    NECK SURGERY     SPINE SURGERY     ACDF C3-4   VAGINAL HYSTERECTOMY      Social History   Socioeconomic History   Marital status: Married    Spouse name: Not on file   Number of children: 1   Years of education: Not on file   Highest education level: Not on file  Occupational History   Occupation: retired  Tobacco Use   Smoking status: Never   Smokeless tobacco: Never  Vaping Use   Vaping status: Never Used  Substance and Sexual Activity   Alcohol use: No    Alcohol/week: 0.0 standard drinks of alcohol   Drug use: No   Sexual activity: Not on file  Other Topics Concern   Not on file  Social History Narrative   Lives with husband in a one story home.  Has one child.     Retired from  [employer] and [employer].     Right handed   Social Drivers of Health   Tobacco Use: Low Risk (01/21/2024)   Patient History    Smoking Tobacco Use: Never    Smokeless Tobacco Use: Never    Passive Exposure: Not on file  Financial Resource Strain: Not  on file  Food Insecurity: No Food Insecurity (11/07/2022)   Hunger Vital Sign    Worried About Running Out of Food in the Last Year: Never true    Ran Out of Food in the Last Year: Never true  Transportation Needs: No Transportation Needs (11/07/2022)   PRAPARE - Administrator, Civil Service (Medical): No    Lack of Transportation (Non-Medical): No  Physical Activity: Not on file  Stress: Not on file  Social Connections: Not on file  Intimate Partner Violence: Not At Risk (03/01/2022)   Humiliation, Afraid, Rape, and Kick questionnaire    Fear of Current or Ex-Partner: No    Emotionally Abused: No    Physically Abused: No    Sexually Abused: No  Depression (PHQ2-9): Low Risk (06/24/2023)   Depression (PHQ2-9)    PHQ-2 Score: 3  Alcohol Screen: Not on file   Housing: Unknown (09/23/2023)   Received from [facility]   Epic    At any time in the past 12 months, were you homeless or living in a shelter (including now)?: No    Number of Times Moved in the Last Year: Not on file    Unable to Pay for Housing in the Last Year: Not on file  Utilities: At Risk (03/01/2022)   [facility] Utilities    Threatened with loss of utilities: Already shut off  Health Literacy: Not on file    Family History  Problem Relation Age of Onset   Hypertension Mother    Kidney failure Mother    Heart disease Mother    Hypertension Father    Cancer Father    Hypertension Sister    Hypertension Sister    Healthy Son    Breast cancer Neg Hx     Allergies[1]  Show/hide medication list[2]  Review of Systems  Constitutional: Negative.  Negative for chills, fever and malaise/fatigue.  HENT: Negative.  Negative for congestion and sore throat.   Eyes: Negative.  Negative for blurred vision and pain.  Respiratory: Negative.  Negative for cough and shortness of breath.   Cardiovascular: Negative.  Negative for chest pain, palpitations and leg swelling.  Gastrointestinal: Negative.  Negative for abdominal pain, blood in stool, constipation, diarrhea, heartburn, melena, nausea and vomiting.  Genitourinary: Negative.  Negative for dysuria, flank pain, frequency and urgency.  Musculoskeletal:  Positive for back pain. Negative for joint pain and myalgias.  Skin: Negative.   Neurological: Negative.  Negative for dizziness, tingling, sensory change, weakness and headaches.  Endo/Heme/Allergies: Negative.   Psychiatric/Behavioral: Negative.  Negative for depression and suicidal ideas. The patient is not nervous/anxious.        Objective:   BP 130/84   Pulse (!) 57   Ht 5' 4 (1.626 m)   Wt 138 lb 12.8 oz (63 kg)   LMP  (LMP Unknown)   SpO2 99%   BMI 23.82 kg/m   Vitals:   01/21/24 0945  BP: 130/84  Pulse: (!) 57  Height: 5' 4 (1.626 m)   Weight: 138 lb 12.8 oz (63 kg)  SpO2: 99%  BMI (Calculated): 23.81    Physical Exam Vitals and nursing note reviewed.  Constitutional:      Appearance: Normal appearance.  HENT:     Head: Normocephalic and atraumatic.     Nose: Nose normal.     Mouth/Throat:     Mouth: Mucous membranes are moist.     Pharynx: Oropharynx is clear.  Eyes:  Conjunctiva/sclera: Conjunctivae normal.     Pupils: Pupils are equal, round, and reactive to light.  Cardiovascular:     Rate and Rhythm: Normal rate and regular rhythm.     Pulses: Normal pulses.     Heart sounds: Normal heart sounds. No murmur heard. Pulmonary:     Effort: Pulmonary effort is normal.     Breath sounds: Normal breath sounds. No wheezing.  Abdominal:     General: Bowel sounds are normal.     Palpations: Abdomen is soft.     Tenderness: There is no abdominal tenderness. There is no right CVA tenderness or left CVA tenderness.  Musculoskeletal:        General: Normal range of motion.     Cervical back: Normal range of motion.     Right lower leg: No edema.     Left lower leg: No edema.  Skin:    General: Skin is warm and dry.  Neurological:     General: No focal deficit present.     Mental Status: She is alert and oriented to person, place, and time.  Psychiatric:        Mood and Affect: Mood normal.        Behavior: Behavior normal.      No results found for any visits on 01/21/24.  Recent Results (from the past 2160 hours)  Hemoglobin A1c     Status: None   Collection Time: 01/16/24 12:20 PM  Result Value Ref Range   Hgb A1c MFr Bld 5.4 4.8 - 5.6 %    Comment:          Prediabetes: 5.7 - 6.4          Diabetes: >6.4          Glycemic control for adults with diabetes: <7.0    Est. average glucose Bld gHb Est-mCnc 108 mg/dL  Lipid Panel w/o Chol/HDL Ratio     Status: Abnormal   Collection Time: 01/16/24 12:20 PM  Result Value Ref Range   Cholesterol, Total 233 (H) 100 - 199 mg/dL   Triglycerides 95 0 -  149 mg/dL   HDL 74 >60 mg/dL   VLDL Cholesterol Cal 16 5 - 40 mg/dL   LDL Chol Calc (NIH) 856 (H) 0 - 99 mg/dL  Uric acid     Status: None   Collection Time: 01/16/24 12:20 PM  Result Value Ref Range   Uric Acid 3.7 3.1 - 7.9 mg/dL    Comment:            Therapeutic target for gout patients: <6.0  CMP14+EGFR     Status: None   Collection Time: 01/16/24 12:20 PM  Result Value Ref Range   Glucose 84 70 - 99 mg/dL   BUN 18 8 - 27 mg/dL   Creatinine, Ser 9.12 0.57 - 1.00 mg/dL   eGFR 66 >40 fO/fpw/8.26   BUN/Creatinine Ratio 21 12 - 28   Sodium 144 134 - 144 mmol/L   Potassium 3.6 3.5 - 5.2 mmol/L   Chloride 106 96 - 106 mmol/L   CO2 26 20 - 29 mmol/L   Calcium  9.7 8.7 - 10.3 mg/dL   Total Protein 7.0 6.0 - 8.5 g/dL   Albumin 4.5 3.7 - 4.7 g/dL   Globulin, Total 2.5 1.5 - 4.5 g/dL   Bilirubin Total 0.7 0.0 - 1.2 mg/dL   Alkaline Phosphatase 98 48 - 129 IU/L   AST 18 0 - 40 IU/L   ALT 16 0 -  32 IU/L  CBC with Diff     Status: Abnormal   Collection Time: 01/16/24 12:20 PM  Result Value Ref Range   WBC 6.4 3.4 - 10.8 x10E3/uL   RBC 4.29 3.77 - 5.28 x10E6/uL   Hemoglobin 11.8 11.1 - 15.9 g/dL   Hematocrit 62.3 65.9 - 46.6 %   MCV 88 79 - 97 fL   MCH 27.5 26.6 - 33.0 pg   MCHC 31.4 (L) 31.5 - 35.7 g/dL   RDW 86.0 88.2 - 84.5 %   Platelets 250 150 - 450 x10E3/uL   Neutrophils 51 Not Estab. %   Lymphs 37 Not Estab. %   Monocytes 8 Not Estab. %   Eos 3 Not Estab. %   Basos 1 Not Estab. %   Neutrophils Absolute 3.3 1.4 - 7.0 x10E3/uL   Lymphocytes Absolute 2.4 0.7 - 3.1 x10E3/uL   Monocytes Absolute 0.5 0.1 - 0.9 x10E3/uL   EOS (ABSOLUTE) 0.2 0.0 - 0.4 x10E3/uL   Basophils Absolute 0.0 0.0 - 0.2 x10E3/uL   Immature Granulocytes 0 Not Estab. %   Immature Grans (Abs) 0.0 0.0 - 0.1 x10E3/uL      Assessment & Plan:  Continue current medications.  Continue physical therapy for back pain. Start Crestor  5 mg daily. 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   Total time spent: 30 minutes. This time includes review of previous notes and results and patient face to face interaction during today's visit.    FERNAND FREDY RAMAN, MD  01/21/2024   This document may have been prepared by [facility] Voice Recognition software and as such may include unintentional dictation errors.     [1]  Allergies Allergen Reactions   Simvastatin Rash  [2]  Outpatient Medications Prior to Visit  Medication Sig   allopurinol  (ZYLOPRIM ) 100 MG tablet Take 1 tablet (100 mg total) by mouth daily.   aspirin  EC 81 MG tablet Take 81 mg by mouth daily. Swallow whole.   brimonidine -timolol  (COMBIGAN ) 0.2-0.5 % ophthalmic solution Administer 10 drops to both eyes Two (2) times a day. Frequency:PHARMDIR   Dosage:74ml     Instructions:  Note:Instill one drop in each eye twice daily. Dose: 1   brinzolamide  (AZOPT ) 1 % ophthalmic suspension Administer 1 drop to both eyes Two (2) times a day (at 8am and 5pm). Frequency:PHARMDIR   Dosage:0.0     Instructions:  Note:Instill one drop in each eye twice a day. Dose: 1   diclofenac  Sodium (VOLTAREN ) 1 % GEL Apply 2 g topically 2 (two) times daily.   gabapentin  (NEURONTIN ) 100 MG capsule Take 1 capsule (100 mg total) by mouth 2 (two) times daily.   latanoprost  (XALATAN ) 0.005 % ophthalmic solution Administer 2.5 mL to both eyes nightly. Frequency:QD   Dosage:0.0  Instructions:   Note:Dose: 0.005 %   levETIRAcetam  (KEPPRA ) 500 MG tablet Take 1 tablet (500 mg total) by mouth 2 (two) times daily.   losartan  (COZAAR ) 100 MG tablet Take 1 tablet (100 mg total) by mouth daily.   oxybutynin  (DITROPAN -XL) 5 MG 24 hr tablet TAKE 1 TABLET BY MOUTH EVERY DAY   prednisoLONE  acetate (PRED FORTE ) 1 % ophthalmic suspension Place 1 drop into the left eye 4 (four) times daily.   baclofen  (LIORESAL ) 10 MG tablet Take 1 tablet (10 mg total) by mouth at bedtime. (Patient not taking: Reported on 01/21/2024)   meloxicam  (MOBIC ) 15 MG tablet TAKE 1 TABLET (15 MG TOTAL) BY MOUTH DAILY. (Patient not taking: Reported on 01/21/2024)   pantoprazole  (PROTONIX ) 40 MG tablet Take 1 tablet (40 mg total) by mouth daily. (Patient not taking: Reported on 01/21/2024)   torsemide (DEMADEX) 10 MG tablet Take 10 mg by mouth daily. Take Monday, Wednesday, and Friday (Patient not taking: Reported on 01/21/2024)   No facility-administered medications prior to visit.

## 2024-02-20 ENCOUNTER — Other Ambulatory Visit: Payer: Self-pay | Admitting: Internal Medicine

## 2024-02-20 DIAGNOSIS — Z1231 Encounter for screening mammogram for malignant neoplasm of breast: Secondary | ICD-10-CM

## 2024-03-05 ENCOUNTER — Other Ambulatory Visit: Payer: Self-pay | Admitting: Internal Medicine

## 2024-03-05 DIAGNOSIS — I10 Essential (primary) hypertension: Secondary | ICD-10-CM

## 2024-03-18 ENCOUNTER — Encounter

## 2024-04-20 ENCOUNTER — Ambulatory Visit: Admitting: Internal Medicine

## 2024-05-06 ENCOUNTER — Ambulatory Visit: Admitting: Neurology
